# Patient Record
Sex: Male | Born: 1950 | Race: White | Hispanic: No | Marital: Married | Smoking: Former smoker
Health system: Southern US, Community
[De-identification: ages and names within clinical notes are randomized; demographics above are authoritative.]

## PROBLEM LIST (undated history)

## (undated) DIAGNOSIS — G629 Polyneuropathy, unspecified: Secondary | ICD-10-CM

## (undated) DIAGNOSIS — I10 Essential (primary) hypertension: Secondary | ICD-10-CM

## (undated) DIAGNOSIS — Z01810 Encounter for preprocedural cardiovascular examination: Secondary | ICD-10-CM

## (undated) DIAGNOSIS — T50902A Poisoning by unspecified drugs, medicaments and biological substances, intentional self-harm, initial encounter: Secondary | ICD-10-CM

## (undated) HISTORY — DX: Encounter for preprocedural cardiovascular examination: Z01.810

## (undated) HISTORY — PX: KNEE ARTHROSCOPY: SUR90

## (undated) HISTORY — DX: Poisoning by unspecified drugs, medicaments and biological substances, intentional self-harm, initial encounter: T50.902A

## (undated) HISTORY — PX: HERNIA REPAIR: SHX51

## (undated) HISTORY — PX: APPENDECTOMY: SHX54

## (undated) HISTORY — PX: SHOULDER SURGERY: SHX246

---

## 2011-09-02 DIAGNOSIS — E669 Obesity, unspecified: Secondary | ICD-10-CM | POA: Insufficient documentation

## 2011-09-02 HISTORY — DX: Obesity, unspecified: E66.9

## 2013-11-25 DIAGNOSIS — G603 Idiopathic progressive neuropathy: Secondary | ICD-10-CM

## 2013-11-25 HISTORY — DX: Idiopathic progressive neuropathy: G60.3

## 2016-01-16 ENCOUNTER — Other Ambulatory Visit: Payer: Self-pay | Admitting: Orthopedic Surgery

## 2016-01-16 DIAGNOSIS — M25511 Pain in right shoulder: Secondary | ICD-10-CM

## 2016-01-24 ENCOUNTER — Ambulatory Visit
Admission: RE | Admit: 2016-01-24 | Discharge: 2016-01-24 | Disposition: A | Discharge status: Home / Self Care | Payer: Medicare Other | Source: Ambulatory Visit | Attending: Orthopedic Surgery | Admitting: Orthopedic Surgery

## 2016-01-24 DIAGNOSIS — M25511 Pain in right shoulder: Secondary | ICD-10-CM

## 2016-03-06 ENCOUNTER — Other Ambulatory Visit (HOSPITAL_COMMUNITY): Payer: Self-pay | Admitting: Urology

## 2016-03-06 DIAGNOSIS — R972 Elevated prostate specific antigen [PSA]: Secondary | ICD-10-CM

## 2016-03-20 ENCOUNTER — Ambulatory Visit (HOSPITAL_COMMUNITY): Payer: Medicare Other

## 2016-11-17 DIAGNOSIS — G894 Chronic pain syndrome: Secondary | ICD-10-CM | POA: Diagnosis not present

## 2016-11-17 DIAGNOSIS — Z79891 Long term (current) use of opiate analgesic: Secondary | ICD-10-CM | POA: Diagnosis not present

## 2016-11-17 DIAGNOSIS — G629 Polyneuropathy, unspecified: Secondary | ICD-10-CM | POA: Diagnosis not present

## 2016-11-17 DIAGNOSIS — R69 Illness, unspecified: Secondary | ICD-10-CM | POA: Diagnosis not present

## 2016-11-17 DIAGNOSIS — M5090 Cervical disc disorder, unspecified, unspecified cervical region: Secondary | ICD-10-CM | POA: Diagnosis not present

## 2016-12-12 DIAGNOSIS — G603 Idiopathic progressive neuropathy: Secondary | ICD-10-CM | POA: Diagnosis not present

## 2017-01-06 DIAGNOSIS — M5090 Cervical disc disorder, unspecified, unspecified cervical region: Secondary | ICD-10-CM | POA: Diagnosis not present

## 2017-01-06 DIAGNOSIS — F1120 Opioid dependence, uncomplicated: Secondary | ICD-10-CM | POA: Diagnosis not present

## 2017-01-06 DIAGNOSIS — G894 Chronic pain syndrome: Secondary | ICD-10-CM | POA: Diagnosis not present

## 2017-01-06 DIAGNOSIS — G629 Polyneuropathy, unspecified: Secondary | ICD-10-CM | POA: Diagnosis not present

## 2017-01-06 DIAGNOSIS — R69 Illness, unspecified: Secondary | ICD-10-CM | POA: Diagnosis not present

## 2017-01-06 DIAGNOSIS — Z79891 Long term (current) use of opiate analgesic: Secondary | ICD-10-CM | POA: Diagnosis not present

## 2017-01-26 DIAGNOSIS — Z6830 Body mass index (BMI) 30.0-30.9, adult: Secondary | ICD-10-CM | POA: Diagnosis not present

## 2017-01-26 DIAGNOSIS — Z79899 Other long term (current) drug therapy: Secondary | ICD-10-CM | POA: Diagnosis not present

## 2017-01-26 DIAGNOSIS — D519 Vitamin B12 deficiency anemia, unspecified: Secondary | ICD-10-CM | POA: Diagnosis not present

## 2017-01-26 DIAGNOSIS — E876 Hypokalemia: Secondary | ICD-10-CM | POA: Diagnosis not present

## 2017-01-26 DIAGNOSIS — Z13220 Encounter for screening for lipoid disorders: Secondary | ICD-10-CM | POA: Diagnosis not present

## 2017-01-26 DIAGNOSIS — G629 Polyneuropathy, unspecified: Secondary | ICD-10-CM | POA: Diagnosis not present

## 2017-01-26 DIAGNOSIS — R5383 Other fatigue: Secondary | ICD-10-CM | POA: Diagnosis not present

## 2017-01-26 DIAGNOSIS — I10 Essential (primary) hypertension: Secondary | ICD-10-CM | POA: Diagnosis not present

## 2017-02-06 DIAGNOSIS — Z79899 Other long term (current) drug therapy: Secondary | ICD-10-CM | POA: Diagnosis not present

## 2017-02-10 DIAGNOSIS — G894 Chronic pain syndrome: Secondary | ICD-10-CM | POA: Diagnosis not present

## 2017-02-10 DIAGNOSIS — G629 Polyneuropathy, unspecified: Secondary | ICD-10-CM | POA: Diagnosis not present

## 2017-02-10 DIAGNOSIS — M5090 Cervical disc disorder, unspecified, unspecified cervical region: Secondary | ICD-10-CM | POA: Diagnosis not present

## 2017-02-10 DIAGNOSIS — Z79891 Long term (current) use of opiate analgesic: Secondary | ICD-10-CM | POA: Diagnosis not present

## 2017-02-10 DIAGNOSIS — R69 Illness, unspecified: Secondary | ICD-10-CM | POA: Diagnosis not present

## 2017-03-04 DIAGNOSIS — E876 Hypokalemia: Secondary | ICD-10-CM | POA: Diagnosis not present

## 2017-03-10 DIAGNOSIS — Z79891 Long term (current) use of opiate analgesic: Secondary | ICD-10-CM | POA: Diagnosis not present

## 2017-03-10 DIAGNOSIS — G629 Polyneuropathy, unspecified: Secondary | ICD-10-CM | POA: Diagnosis not present

## 2017-03-10 DIAGNOSIS — M5090 Cervical disc disorder, unspecified, unspecified cervical region: Secondary | ICD-10-CM | POA: Diagnosis not present

## 2017-03-10 DIAGNOSIS — R69 Illness, unspecified: Secondary | ICD-10-CM | POA: Diagnosis not present

## 2017-03-10 DIAGNOSIS — G894 Chronic pain syndrome: Secondary | ICD-10-CM | POA: Diagnosis not present

## 2017-04-07 DIAGNOSIS — M5090 Cervical disc disorder, unspecified, unspecified cervical region: Secondary | ICD-10-CM | POA: Diagnosis not present

## 2017-04-07 DIAGNOSIS — G629 Polyneuropathy, unspecified: Secondary | ICD-10-CM | POA: Diagnosis not present

## 2017-04-07 DIAGNOSIS — R69 Illness, unspecified: Secondary | ICD-10-CM | POA: Diagnosis not present

## 2017-04-07 DIAGNOSIS — G894 Chronic pain syndrome: Secondary | ICD-10-CM | POA: Diagnosis not present

## 2017-04-07 DIAGNOSIS — Z79891 Long term (current) use of opiate analgesic: Secondary | ICD-10-CM | POA: Diagnosis not present

## 2017-05-05 DIAGNOSIS — R69 Illness, unspecified: Secondary | ICD-10-CM | POA: Diagnosis not present

## 2017-05-05 DIAGNOSIS — G629 Polyneuropathy, unspecified: Secondary | ICD-10-CM | POA: Diagnosis not present

## 2017-05-05 DIAGNOSIS — Z79891 Long term (current) use of opiate analgesic: Secondary | ICD-10-CM | POA: Diagnosis not present

## 2017-05-05 DIAGNOSIS — G894 Chronic pain syndrome: Secondary | ICD-10-CM | POA: Diagnosis not present

## 2017-05-05 DIAGNOSIS — M5090 Cervical disc disorder, unspecified, unspecified cervical region: Secondary | ICD-10-CM | POA: Diagnosis not present

## 2017-05-26 DIAGNOSIS — Z1389 Encounter for screening for other disorder: Secondary | ICD-10-CM | POA: Diagnosis not present

## 2017-05-26 DIAGNOSIS — Z23 Encounter for immunization: Secondary | ICD-10-CM | POA: Diagnosis not present

## 2017-05-26 DIAGNOSIS — Z6830 Body mass index (BMI) 30.0-30.9, adult: Secondary | ICD-10-CM | POA: Diagnosis not present

## 2017-05-26 DIAGNOSIS — Z9181 History of falling: Secondary | ICD-10-CM | POA: Diagnosis not present

## 2017-05-26 DIAGNOSIS — Z0000 Encounter for general adult medical examination without abnormal findings: Secondary | ICD-10-CM | POA: Diagnosis not present

## 2017-06-03 DIAGNOSIS — M5090 Cervical disc disorder, unspecified, unspecified cervical region: Secondary | ICD-10-CM | POA: Diagnosis not present

## 2017-06-03 DIAGNOSIS — Z79891 Long term (current) use of opiate analgesic: Secondary | ICD-10-CM | POA: Diagnosis not present

## 2017-06-03 DIAGNOSIS — G629 Polyneuropathy, unspecified: Secondary | ICD-10-CM | POA: Diagnosis not present

## 2017-06-03 DIAGNOSIS — R69 Illness, unspecified: Secondary | ICD-10-CM | POA: Diagnosis not present

## 2017-06-03 DIAGNOSIS — G894 Chronic pain syndrome: Secondary | ICD-10-CM | POA: Diagnosis not present

## 2017-06-08 DIAGNOSIS — E041 Nontoxic single thyroid nodule: Secondary | ICD-10-CM | POA: Diagnosis not present

## 2017-06-08 DIAGNOSIS — I714 Abdominal aortic aneurysm, without rupture: Secondary | ICD-10-CM | POA: Diagnosis not present

## 2017-06-08 DIAGNOSIS — E049 Nontoxic goiter, unspecified: Secondary | ICD-10-CM | POA: Diagnosis not present

## 2017-06-18 ENCOUNTER — Encounter (HOSPITAL_COMMUNITY): Payer: Self-pay | Admitting: Emergency Medicine

## 2017-06-18 ENCOUNTER — Emergency Department (HOSPITAL_COMMUNITY)
Admission: EM | Admit: 2017-06-18 | Discharge: 2017-06-19 | Disposition: A | Discharge status: Other Acute Inpatient Hospital | Payer: Medicare HMO | Attending: Emergency Medicine | Admitting: Emergency Medicine

## 2017-06-18 DIAGNOSIS — R69 Illness, unspecified: Secondary | ICD-10-CM | POA: Diagnosis not present

## 2017-06-18 DIAGNOSIS — R45851 Suicidal ideations: Secondary | ICD-10-CM | POA: Diagnosis not present

## 2017-06-18 DIAGNOSIS — F329 Major depressive disorder, single episode, unspecified: Secondary | ICD-10-CM | POA: Diagnosis not present

## 2017-06-18 DIAGNOSIS — F32A Depression, unspecified: Secondary | ICD-10-CM

## 2017-06-18 HISTORY — DX: Polyneuropathy, unspecified: G62.9

## 2017-06-18 LAB — COMPREHENSIVE METABOLIC PANEL
ALBUMIN: 4.5 g/dL (ref 3.5–5.0)
ALT: 19 U/L (ref 17–63)
AST: 19 U/L (ref 15–41)
Alkaline Phosphatase: 100 U/L (ref 38–126)
Anion gap: 10 (ref 5–15)
BUN: 12 mg/dL (ref 6–20)
CHLORIDE: 100 mmol/L — AB (ref 101–111)
CO2: 29 mmol/L (ref 22–32)
CREATININE: 1.26 mg/dL — AB (ref 0.61–1.24)
Calcium: 9.5 mg/dL (ref 8.9–10.3)
GFR calc Af Amer: >60 mL/min (ref >60)
GFR calc non Af Amer: 58 mL/min — ABNORMAL LOW (ref >60)
GLUCOSE: 130 mg/dL — AB (ref 65–99)
POTASSIUM: 3.5 mmol/L (ref 3.5–5.1)
SODIUM: 139 mmol/L (ref 135–145)
Total Bilirubin: 1.1 mg/dL (ref 0.3–1.2)
Total Protein: 8.2 g/dL — ABNORMAL HIGH (ref 6.5–8.1)

## 2017-06-18 LAB — RAPID URINE DRUG SCREEN, HOSP PERFORMED
AMPHETAMINES: NOT DETECTED
BENZODIAZEPINES: POSITIVE — AB
Barbiturates: NOT DETECTED
Cocaine: NOT DETECTED
OPIATES: POSITIVE — AB
TETRAHYDROCANNABINOL: NOT DETECTED

## 2017-06-18 LAB — CBC
HCT: 44.8 % (ref 39.0–52.0)
HEMOGLOBIN: 15.4 g/dL (ref 13.0–17.0)
MCH: 29.8 pg (ref 26.0–34.0)
MCHC: 34.4 g/dL (ref 30.0–36.0)
MCV: 86.8 fL (ref 78.0–100.0)
PLATELETS: 244 10*3/uL (ref 150–400)
RBC: 5.16 MIL/uL (ref 4.22–5.81)
RDW: 13.5 % (ref 11.5–15.5)
WBC: 10.6 10*3/uL — AB (ref 4.0–10.5)

## 2017-06-18 LAB — SALICYLATE LEVEL: Salicylate Lvl: <7 mg/dL (ref 2.8–30.0)

## 2017-06-18 LAB — ETHANOL: Alcohol, Ethyl (B): <5 mg/dL (ref <5)

## 2017-06-18 LAB — ACETAMINOPHEN LEVEL: Acetaminophen (Tylenol), Serum: <10 ug/mL — ABNORMAL LOW (ref 10–30)

## 2017-06-18 NOTE — ED Triage Notes (Signed)
Pt here with increased anxiety and depression; pt sts SI with plan to either use a gun or overdose; pt sts going on for weeks getting more severe; pt denies ever speaking to psychiatrist

## 2017-06-18 NOTE — ED Notes (Signed)
Pt's wife stated that she was going home for the night, she stated that pt took his night time medication at 1900 tonight. Sherree Knape: [phone] (home), [phone] (cell). Wife took belongings home.

## 2017-06-19 ENCOUNTER — Encounter: Payer: Self-pay | Admitting: Psychiatry

## 2017-06-19 ENCOUNTER — Inpatient Hospital Stay
Admission: AD | Admit: 2017-06-19 | Discharge: 2017-06-26 | DRG: 885 | Disposition: A | Discharge status: Home / Self Care | Payer: Medicare HMO | Source: Intra-hospital | Attending: Psychiatry | Admitting: Psychiatry

## 2017-06-19 ENCOUNTER — Other Ambulatory Visit: Payer: Self-pay | Admitting: Family

## 2017-06-19 DIAGNOSIS — R45851 Suicidal ideations: Secondary | ICD-10-CM | POA: Diagnosis present

## 2017-06-19 DIAGNOSIS — R29818 Other symptoms and signs involving the nervous system: Secondary | ICD-10-CM | POA: Diagnosis present

## 2017-06-19 DIAGNOSIS — F333 Major depressive disorder, recurrent, severe with psychotic symptoms: Principal | ICD-10-CM | POA: Diagnosis present

## 2017-06-19 DIAGNOSIS — K5900 Constipation, unspecified: Secondary | ICD-10-CM | POA: Diagnosis present

## 2017-06-19 DIAGNOSIS — R682 Dry mouth, unspecified: Secondary | ICD-10-CM | POA: Diagnosis present

## 2017-06-19 DIAGNOSIS — Z818 Family history of other mental and behavioral disorders: Secondary | ICD-10-CM

## 2017-06-19 DIAGNOSIS — R69 Illness, unspecified: Secondary | ICD-10-CM | POA: Diagnosis not present

## 2017-06-19 DIAGNOSIS — N400 Enlarged prostate without lower urinary tract symptoms: Secondary | ICD-10-CM | POA: Diagnosis present

## 2017-06-19 DIAGNOSIS — G894 Chronic pain syndrome: Secondary | ICD-10-CM | POA: Diagnosis present

## 2017-06-19 DIAGNOSIS — E785 Hyperlipidemia, unspecified: Secondary | ICD-10-CM | POA: Diagnosis present

## 2017-06-19 DIAGNOSIS — E538 Deficiency of other specified B group vitamins: Secondary | ICD-10-CM | POA: Diagnosis present

## 2017-06-19 DIAGNOSIS — G4700 Insomnia, unspecified: Secondary | ICD-10-CM | POA: Diagnosis present

## 2017-06-19 DIAGNOSIS — I10 Essential (primary) hypertension: Secondary | ICD-10-CM | POA: Diagnosis present

## 2017-06-19 DIAGNOSIS — R4189 Other symptoms and signs involving cognitive functions and awareness: Secondary | ICD-10-CM | POA: Diagnosis present

## 2017-06-19 DIAGNOSIS — N138 Other obstructive and reflux uropathy: Secondary | ICD-10-CM | POA: Diagnosis present

## 2017-06-19 DIAGNOSIS — F322 Major depressive disorder, single episode, severe without psychotic features: Secondary | ICD-10-CM | POA: Diagnosis not present

## 2017-06-19 DIAGNOSIS — Z5181 Encounter for therapeutic drug level monitoring: Secondary | ICD-10-CM | POA: Diagnosis not present

## 2017-06-19 DIAGNOSIS — F411 Generalized anxiety disorder: Secondary | ICD-10-CM

## 2017-06-19 DIAGNOSIS — R410 Disorientation, unspecified: Secondary | ICD-10-CM | POA: Diagnosis not present

## 2017-06-19 DIAGNOSIS — G603 Idiopathic progressive neuropathy: Secondary | ICD-10-CM | POA: Diagnosis present

## 2017-06-19 DIAGNOSIS — F329 Major depressive disorder, single episode, unspecified: Secondary | ICD-10-CM | POA: Diagnosis not present

## 2017-06-19 HISTORY — DX: Essential (primary) hypertension: I10

## 2017-06-19 HISTORY — DX: Chronic pain syndrome: G89.4

## 2017-06-19 HISTORY — DX: Other obstructive and reflux uropathy: N13.8

## 2017-06-19 MED ORDER — AMLODIPINE BESYLATE 5 MG PO TABS
10.0000 mg | ORAL_TABLET | Freq: Every day | ORAL | Status: DC
  Administered 2017-06-19: 10 mg via ORAL
  Filled 2017-06-19 (×2): qty 2

## 2017-06-19 MED ORDER — AMITRIPTYLINE HCL 25 MG PO TABS: 25.0000 mg | ORAL_TABLET | Freq: Every day | ORAL | Status: DC

## 2017-06-19 MED ORDER — AMITRIPTYLINE HCL 25 MG PO TABS
25.0000 mg | ORAL_TABLET | Freq: Once | ORAL | Status: AC
  Administered 2017-06-19: 25 mg via ORAL
  Filled 2017-06-19: qty 1

## 2017-06-19 MED ORDER — BUPROPION HCL ER (XL) 150 MG PO TB24: 150.0000 mg | ORAL_TABLET | Freq: Every day | ORAL | Status: DC

## 2017-06-19 MED ORDER — LISINOPRIL 20 MG PO TABS
40.0000 mg | ORAL_TABLET | Freq: Every day | ORAL | Status: DC
  Administered 2017-06-19: 40 mg via ORAL
  Filled 2017-06-19 (×2): qty 2

## 2017-06-19 MED ORDER — DULOXETINE HCL 30 MG PO CPEP
60.0000 mg | ORAL_CAPSULE | Freq: Every day | ORAL | Status: DC
  Administered 2017-06-19 – 2017-06-23 (×5): 60 mg via ORAL
  Filled 2017-06-19 (×4): qty 2

## 2017-06-19 MED ORDER — CYCLOBENZAPRINE HCL 10 MG PO TABS: 10.0000 mg | ORAL_TABLET | Freq: Every day | ORAL | Status: DC

## 2017-06-19 MED ORDER — PREGABALIN 50 MG PO CAPS
200.0000 mg | ORAL_CAPSULE | Freq: Once | ORAL | Status: AC
  Administered 2017-06-19: 200 mg via ORAL
  Filled 2017-06-19: qty 4

## 2017-06-19 MED ORDER — CYCLOBENZAPRINE HCL 10 MG PO TABS
10.0000 mg | ORAL_TABLET | Freq: Every day | ORAL | Status: DC
  Administered 2017-06-19 – 2017-06-25 (×7): 10 mg via ORAL
  Filled 2017-06-19 (×7): qty 1

## 2017-06-19 MED ORDER — AMLODIPINE BESYLATE 5 MG PO TABS
10.0000 mg | ORAL_TABLET | Freq: Every day | ORAL | Status: DC
  Administered 2017-06-20 – 2017-06-26 (×8): 10 mg via ORAL
  Filled 2017-06-19 (×8): qty 2

## 2017-06-19 MED ORDER — MAGNESIUM HYDROXIDE 400 MG/5ML PO SUSP: 30.0000 mL | Freq: Every day | ORAL | Status: DC | PRN

## 2017-06-19 MED ORDER — HYDROCODONE-ACETAMINOPHEN 7.5-325 MG PO TABS
1.0000 | ORAL_TABLET | Freq: Three times a day (TID) | ORAL | Status: DC
  Administered 2017-06-19 (×2): 1 via ORAL
  Filled 2017-06-19 (×2): qty 1

## 2017-06-19 MED ORDER — ACETAMINOPHEN 325 MG PO TABS
650.0000 mg | ORAL_TABLET | Freq: Four times a day (QID) | ORAL | Status: DC | PRN
Start: 2017-06-19 — End: 2017-06-26

## 2017-06-19 MED ORDER — LISINOPRIL 20 MG PO TABS
40.0000 mg | ORAL_TABLET | Freq: Every day | ORAL | Status: DC
  Administered 2017-06-20 – 2017-06-26 (×7): 40 mg via ORAL
  Filled 2017-06-19 (×8): qty 2

## 2017-06-19 MED ORDER — POTASSIUM CHLORIDE CRYS ER 20 MEQ PO TBCR
20.0000 meq | EXTENDED_RELEASE_TABLET | Freq: Two times a day (BID) | ORAL | Status: DC
  Administered 2017-06-19: 20 meq via ORAL
  Filled 2017-06-19: qty 1

## 2017-06-19 MED ORDER — ALUM & MAG HYDROXIDE-SIMETH 200-200-20 MG/5ML PO SUSP: 30.0000 mL | ORAL | Status: DC | PRN

## 2017-06-19 MED ORDER — TRAZODONE HCL 100 MG PO TABS
100.0000 mg | ORAL_TABLET | Freq: Every evening | ORAL | Status: DC | PRN
  Administered 2017-06-19 – 2017-06-21 (×3): 100 mg via ORAL
  Filled 2017-06-19 (×3): qty 1

## 2017-06-19 MED ORDER — B COMPLEX-C PO TABS
1.0000 | ORAL_TABLET | Freq: Every day | ORAL | Status: DC
  Administered 2017-06-19: 1 via ORAL
  Filled 2017-06-19 (×2): qty 1

## 2017-06-19 MED ORDER — HYDROCODONE-ACETAMINOPHEN 7.5-325 MG/15ML PO SOLN: 10.0000 mL | Freq: Four times a day (QID) | ORAL | Status: DC | PRN

## 2017-06-19 MED ORDER — PRAVASTATIN SODIUM 20 MG PO TABS
20.0000 mg | ORAL_TABLET | Freq: Every day | ORAL | Status: DC
  Administered 2017-06-20 – 2017-06-25 (×6): 20 mg via ORAL
  Filled 2017-06-19 (×6): qty 1

## 2017-06-19 MED ORDER — HYDROCODONE-ACETAMINOPHEN 7.5-325 MG PO TABS
1.0000 | ORAL_TABLET | Freq: Four times a day (QID) | ORAL | Status: DC | PRN
  Administered 2017-06-19: 1 via ORAL
  Filled 2017-06-19: qty 1

## 2017-06-19 MED ORDER — ADULT MULTIVITAMIN W/MINERALS CH
1.0000 | ORAL_TABLET | Freq: Every day | ORAL | Status: DC
  Administered 2017-06-19: 1 via ORAL
  Filled 2017-06-19: qty 1

## 2017-06-19 MED ORDER — PRAVASTATIN SODIUM 40 MG PO TABS
20.0000 mg | ORAL_TABLET | Freq: Every day | ORAL | Status: DC
  Administered 2017-06-19: 20 mg via ORAL
  Filled 2017-06-19 (×2): qty 1

## 2017-06-19 MED ORDER — PREGABALIN 50 MG PO CAPS
200.0000 mg | ORAL_CAPSULE | Freq: Three times a day (TID) | ORAL | Status: DC
  Administered 2017-06-20 – 2017-06-22 (×8): 200 mg via ORAL
  Filled 2017-06-19 (×8): qty 4

## 2017-06-19 MED ORDER — KETOROLAC TROMETHAMINE 60 MG/2ML IM SOLN
60.0000 mg | Freq: Once | INTRAMUSCULAR | Status: AC
  Administered 2017-06-19: 60 mg via INTRAMUSCULAR
  Filled 2017-06-19: qty 2

## 2017-06-19 MED ORDER — ALPRAZOLAM 0.25 MG PO TABS: 0.2500 mg | ORAL_TABLET | Freq: Every day | ORAL | Status: DC

## 2017-06-19 MED ORDER — HYPROMELLOSE (GONIOSCOPIC) 2.5 % OP SOLN: 2.0000 [drp] | Freq: Three times a day (TID) | OPHTHALMIC | Status: DC | PRN

## 2017-06-19 MED ORDER — AMITRIPTYLINE HCL 25 MG PO TABS: 75.0000 mg | ORAL_TABLET | Freq: Every day | ORAL | Status: DC

## 2017-06-19 MED ORDER — PREGABALIN 50 MG PO CAPS
100.0000 mg | ORAL_CAPSULE | Freq: Three times a day (TID) | ORAL | Status: DC
  Administered 2017-06-19 (×2): 100 mg via ORAL
  Filled 2017-06-19 (×2): qty 2

## 2017-06-19 MED ORDER — BUPROPION HCL ER (XL) 150 MG PO TB24
150.0000 mg | ORAL_TABLET | Freq: Every day | ORAL | Status: DC
  Administered 2017-06-19 – 2017-06-22 (×4): 150 mg via ORAL
  Filled 2017-06-19 (×3): qty 1

## 2017-06-19 NOTE — BHH Counselor (Signed)
Writer informed Attending Provider, Lita Mains, MD, of disposition for recommendation of inpatient treatment for Patient at [phone]. Per AC, Lavell Luster, no available beds at [facility]. TTS to seek placement.

## 2017-06-19 NOTE — ED Notes (Signed)
Tried to call report. Nurse will not be able to take report until 19:30.

## 2017-06-19 NOTE — ED Notes (Signed)
Pt verbalized understanding discharge instructions and denies any further needs or questions at this time. VS stable, ambulatory and steady gait.   

## 2017-06-19 NOTE — ED Notes (Signed)
Pt c/o of pins and needle pain all over his body. BP is trending down.

## 2017-06-19 NOTE — ED Notes (Signed)
Pt requesting to have BP meds rescheduled, pt states, " I take those at night." pt aware his BP is high & may need to take meds as scheduled, message sent to pharmacist in the ED re: pts request, will reassess BP @ 11:00 pt aware of plan

## 2017-06-19 NOTE — ED Notes (Signed)
TTS being done at this time.

## 2017-06-19 NOTE — ED Notes (Signed)
Nanavati, Md notified for pts daily med orders to be placed

## 2017-06-19 NOTE — ED Notes (Signed)
EDP Browning at bedside.

## 2017-06-19 NOTE — ED Notes (Signed)
Pt ambulated to BR with even, steady gait.  

## 2017-06-19 NOTE — ED Notes (Signed)
Pt has been accepted at [facility], a bed has not been assigned yet but the plan is to take the Pt today.  [employer] called and asked for a Voluntary Consent to be faxed to  [phone].  Dr. Bary Leriche is the admitting  MD.  Call report to [phone].

## 2017-06-19 NOTE — BH Assessment (Signed)
Patient has been accepted to [facility].  Accepting physician is Dr. Bary Leriche.  Attending Physician will be Dr. Jerilee Hoh.  Patient has been assigned to room 302, by [place].  Call report to [phone].  Representative/Transfer Coordinator is Dispensing optician Patient pre-admitted by [facility] Vonna Kotyk)  [facility] Parks Neptune, Social Worker) made aware of acceptance.

## 2017-06-19 NOTE — ED Notes (Signed)
Pt's wife at bedside.

## 2017-06-19 NOTE — ED Provider Notes (Signed)
[facility] DEPT Provider Note   CSN: [id number] Arrival date & time: 06/18/17  1726     History   Chief Complaint Chief Complaint  Patient presents with  . Suicidal    HPI Joel Gross is a 66 y.o. male.  Patient with past medical history of peripheral neuropathy presents to the emergency department with chief complaint of suicidal ideation and depression. He states that he has felt very depressed for the past 3 years because of his chronic pain in his legs. He states that he has been feeling suicidal recently. He has had a gun at home, but recently gave this to his daughter for safekeeping. He states that because he does not have a gun, he would now overdose on his medications. He denies any drug or alcohol use. He denies any homicidal ideation. Denies any auditory or visual hallucinations.   The history is provided by the patient. No language interpreter was used.    Past Medical History:  Diagnosis Date  . Neuropathy     There are no active problems to display for this patient.   History reviewed. No pertinent surgical history.     Home Medications    Prior to Admission medications   Not on File    Family History History reviewed. No pertinent family history.  Social History Social History  Substance Use Topics  . Smoking status: Never Smoker  . Smokeless tobacco: Never Used  . Alcohol use No     Allergies   Patient has no known allergies.   Review of Systems Review of Systems  All other systems reviewed and are negative.    Physical Exam Updated Vital Signs BP (!) 137/93 (BP Location: Right Arm)   Pulse 64   Temp 97.9 F (36.6 C) (Oral)   Resp 16   SpO2 94%   Physical Exam  Constitutional: He is oriented to person, place, and time. He appears well-developed and well-nourished.  HENT:  Head: Normocephalic and atraumatic.  Eyes: Pupils are equal, round, and reactive to light. Conjunctivae and EOM are normal. Right eye exhibits no  discharge. Left eye exhibits no discharge. No scleral icterus.  Neck: Normal range of motion. Neck supple. No JVD present.  Cardiovascular: Normal rate, regular rhythm and normal heart sounds.  Exam reveals no gallop and no friction rub.   No murmur heard. Pulmonary/Chest: Effort normal and breath sounds normal. No respiratory distress. He has no wheezes. He has no rales. He exhibits no tenderness.  Abdominal: Soft. He exhibits no distension and no mass. There is no tenderness. There is no rebound and no guarding.  Musculoskeletal: Normal range of motion. He exhibits no edema or tenderness.  Neurological: He is alert and oriented to person, place, and time.  Skin: Skin is warm and dry.  Psychiatric: He has a normal mood and affect. His behavior is normal. Judgment and thought content normal.  Nursing note and vitals reviewed.    ED Treatments / Results  Labs (all labs ordered are listed, but only abnormal results are displayed) Labs Reviewed  COMPREHENSIVE METABOLIC PANEL - Abnormal; Notable for the following:       Result Value   Chloride 100 (*)    Glucose, Bld 130 (*)    Creatinine, Ser 1.26 (*)    Total Protein 8.2 (*)    GFR calc non Af Amer 58 (*)    All other components within normal limits  ACETAMINOPHEN LEVEL - Abnormal; Notable for the following:    Acetaminophen (  Tylenol), Serum <10 (*)    All other components within normal limits  CBC - Abnormal; Notable for the following:    WBC 10.6 (*)    All other components within normal limits  RAPID URINE DRUG SCREEN, HOSP PERFORMED - Abnormal; Notable for the following:    Opiates POSITIVE (*)    Benzodiazepines POSITIVE (*)    All other components within normal limits  ETHANOL  SALICYLATE LEVEL    EKG  EKG Interpretation None       Radiology No results found.  Procedures Procedures (including critical care time)  Medications Ordered in ED Medications - No data to display   Initial Impression / Assessment  and Plan / ED Course  I have reviewed the triage vital signs and the nursing notes.  Pertinent labs & imaging results that were available during my care of the patient were reviewed by me and considered in my medical decision making (see chart for details).     Patient here with suicidal ideation. Associated depression. Denies drug or alcohol use.  Medically clear for psychiatric examination.   Final Clinical Impressions(s) / ED Diagnoses   Final diagnoses:  Suicidal ideation  Depression, unspecified depression type    New Prescriptions New Prescriptions   No medications on file     Montine Circle, Hershal Coria 06/19/17 Jocelyn Lamer, MD 06/26/17 1455

## 2017-06-19 NOTE — ED Notes (Signed)
Pharmacy called to reconcile pt's home medications. Sending tech.

## 2017-06-19 NOTE — ED Notes (Signed)
Snack provided to pt

## 2017-06-19 NOTE — BH Assessment (Addendum)
Tele Assessment Note   Joel Gross is an 66 y.o.married male, who voluntarily came into [facility].  [place], [place]. Patient reported contacting a mobile crisis line on 06/18/2017 and being referred to [facility] in [place], [place].  Patient stated that after speaking with a representative at [facility], he was referred to come into the ED.  Patient reported having ongoing medical concerns, relating to pain in his leg.  Patient stated that, as a result, he has been experiencing ongoing suicidal ideations, over the past couple of months.  Patient reported having a current suicidal ideation with a plan to overdose with his prescribed medications or shoot himself in the mouth with a gun. Patient reported having access to his medications, however stated that his gun was locked in a safe, by his daughter.  Patient reported experiencing auditory hallucinations consisting of hearing voices and visual hallucinations consisting of seeing shadows and numbers.  Patient reported currently being prescribed medications for his pain and being compliant with daily dosages.  Patient stated experiencing depressive symptoms, such as fatigue, tearfulness, isolation, feelings of worthless, guilt, loss of interest in pleasure activities, and recurrent thoughts about negative personal behaviors from his past.  Patient denies HI or self-injurious behaviors.   Patient reported currently residing with his wife and receiving social security benefits.  Patient reported experiencing weakness in both of his legs, however confirmed no use of aids and being able to independently complete ADLs.  Patient reported no past history of outpatient or inpatient services of outpatient or inpatient treatment for mental health or substance abuse.  Patient stated currently receiving no outpatient services from outpatient providers. Patient identified supportive factors consist of his wife and coping such as deep breathing exercises.  During the  assessment, Patient was alert, calm and cooperative.  Patient was dressed in scrubs and laying in his bed.  Patient was oriented to the person, location, situation, and time.   Patient's speech was logical, coherent, and slow.   Patient's judgment appeared to be unimpaired.  Patient's thought process was coherent, relevant, and circumstantial.  Patient's mood appeared to be depressed and helpless.  Patient reported being open to any recommendation from provider for services.      Diagnosis: Major depressive disorder, recurrent, severe, without psychotic features  Past Medical History:  Past Medical History:  Diagnosis Date  . Neuropathy     History reviewed. No pertinent surgical history.  Family History: History reviewed. No pertinent family history.  Social History:  reports that he has never smoked. He has never used smokeless tobacco. He reports that he does not drink alcohol or use drugs.  Additional Social History:  Alcohol / Drug Use Pain Medications: See MAR Prescriptions: See MAR Over the Counter: See MAR History of alcohol / drug use?: No history of alcohol / drug abuse (Pt. taking prescribed medications for medical pain.) Longest period of sobriety (when/how long): N/A  CIWA: CIWA-Ar BP: (!) 137/93 Pulse Rate: 64 COWS:    PATIENT STRENGTHS: (choose at least two) Ability for insight Average or above average intelligence Child psychotherapist Motivation for treatment/growth Supportive family/friends  Allergies: No Known Allergies  Home Medications:  (Not in a hospital admission)  OB/GYN Status:  No LMP for male patient.  General Assessment Data Location of Assessment: [facility] ED TTS Assessment: In system Is this a Tele or Face-to-Face Assessment?: Tele Assessment Is this an Initial Assessment or a Re-assessment for this encounter?: Initial Assessment Marital status: Married Is patient pregnant?: No Pregnancy  Status: No Living Arrangements:  Spouse/significant other Can pt return to current living arrangement?: Yes Admission Status: Voluntary Is patient capable of signing voluntary admission?: Yes Referral Source: Self/Family/Friend Insurance type: Medicare     Crisis Care Plan Living Arrangements: Spouse/significant other Legal Guardian: Other: (Self) Name of Psychiatrist: None Name of Therapist: None  Education Status Is patient currently in school?: No Current Grade: N/A Highest grade of school patient has completed: [employer] Name of school: N/A Contact person: N/A  Risk to self with the past 6 months Suicidal Ideation: Yes-Currently Present Has patient been a risk to self within the past 6 months prior to admission? : Yes Suicidal Intent: Yes-Currently Present Has patient had any suicidal intent within the past 6 months prior to admission? : Yes Is patient at risk for suicide?: Yes Suicidal Plan?: Yes-Currently Present Has patient had any suicidal plan within the past 6 months prior to admission? : Yes Specify Current Suicidal Plan: Pt. reports plan to overdose with prescribed medication or shoot himself wtih a gun. Access to Means: Yes Specify Access to Suicidal Means: Pt. reports having access to his medication, but no access to a gun.  What has been your use of drugs/alcohol within the last 12 months?: Patient denies Previous Attempts/Gestures: No How many times?: 0 Other Self Harm Risks: Patient denies. Triggers for Past Attempts: None known Intentional Self Injurious Behavior: None Family Suicide History: Yes Recent stressful life event(s): Other (Comment) (Patient reports concents due to his health. ) Persecutory voices/beliefs?: No Depression: Yes Depression Symptoms: Fatigue, Tearfulness, Feeling worthless/self pity, Feeling angry/irritable, Guilt, Loss of interest in usual pleasures Substance abuse history and/or treatment for substance abuse?: No Suicide prevention information given to  non-admitted patients: Not applicable  Risk to Others within the past 6 months Homicidal Ideation: No (Patient denies) Does patient have any lifetime risk of violence toward others beyond the six months prior to admission? : No Thoughts of Harm to Others: No Current Homicidal Intent: No Current Homicidal Plan: No Access to Homicidal Means: No (Patient denies) Identified Victim: Patient denies History of harm to others?: No Assessment of Violence: On admission Violent Behavior Description: Patient denies Does patient have access to weapons?: No (Patient denies) Criminal Charges Pending?: No Does patient have a court date: No Is patient on probation?: No  Psychosis Hallucinations: Auditory, Visual Delusions: None noted  Mental Status Report Appearance/Hygiene: In scrubs Eye Contact: Good Motor Activity: Rigidity, Unsteady Speech: Logical/coherent, Slow Level of Consciousness: Alert Mood: Depressed, Helpless Affect: Appropriate to circumstance, Depressed Anxiety Level: None Thought Processes: Coherent, Relevant, Circumstantial Judgement: Unimpaired Orientation: Person, Place, Time, Situation Obsessive Compulsive Thoughts/Behaviors: None  Cognitive Functioning Concentration: Good Memory: Recent Intact, Remote Intact IQ: Average Insight: Good Impulse Control: Fair Appetite: Good Weight Loss: 0 Weight Gain: 15 (Per Pt. a weight gain over a 2-year.) Sleep: No Change (7) Total Hours of Sleep: 7 Vegetative Symptoms: None  ADLScreening [facility] Assessment Services) Patient's cognitive ability adequate to safely complete daily activities?: Yes Patient able to express need for assistance with ADLs?: Yes Independently performs ADLs?: Yes (appropriate for developmental age)  Prior Inpatient Therapy Prior Inpatient Therapy: No Prior Therapy Dates: None Prior Therapy Facilty/Provider(s): None Reason for Treatment: None  Prior Outpatient Therapy Prior Outpatient Therapy:  No Prior Therapy Dates: None Prior Therapy Facilty/Provider(s): None Reason for Treatment: None Does patient have an ACCT team?: No Does patient have Intensive In-House Services?  : No Does patient have [facility] services? : No Does patient have P4CC services?: No  ADL Screening (condition at time of admission) Patient's cognitive ability adequate to safely complete daily activities?: Yes Is the patient deaf or have difficulty hearing?: No Does the patient have difficulty seeing, even when wearing glasses/contacts?: No Does the patient have difficulty concentrating, remembering, or making decisions?: No Patient able to express need for assistance with ADLs?: Yes Does the patient have difficulty dressing or bathing?: No Independently performs ADLs?: Yes (appropriate for developmental age) Does the patient have difficulty walking or climbing stairs?: Yes Weakness of Legs: Both Weakness of Arms/Hands: None  Home Assistive Devices/Equipment Home Assistive Devices/Equipment: None    Abuse/Neglect Assessment (Assessment to be complete while patient is alone) Physical Abuse: Denies Verbal Abuse: Denies Sexual Abuse: Denies Exploitation of patient/patient's resources: Denies Self-Neglect: Denies     Regulatory affairs officer (For Healthcare) Does Patient Have a Medical Advance Directive?: No Would patient like information on creating a medical advance directive?: No - Patient declined    Additional Information 1:1 In Past 12 Months?: No CIRT Risk: No Elopement Risk: No Does patient have medical clearance?: Yes     Disposition:  Disposition Initial Assessment Completed for this Encounter: Yes (Per Lindon Romp, NP) Disposition of Patient: Inpatient treatment program Type of inpatient treatment program: Adult  Marcine Matar 06/19/2017 3:23 AM

## 2017-06-19 NOTE — Progress Notes (Signed)
Patient accepted at [facility], to Dr. Bary Leriche.  Please call report at [phone].  LCSW Calvin requested Voluntary Consent to be signed by patient and faxed to [facility] at fax#: [phone]. [facility] RN Myriam Jacobson was notified.   Verlon Setting, MSW, [place] Clinical social worker in [place] [facility], [place] Office [phone] and [phone] 06/19/2017 4:50 PM

## 2017-06-19 NOTE — Progress Notes (Signed)
Per Kerry Dory at [facility], patient is accepted to room 302. Voluntary consent has been received. [facility] RN Myriam Jacobson was informed.  Verlon Setting, MSW, [place] Clinical social worker in disposition [place] [facility], [place] Office [phone] and [phone] 06/19/2017 6:43 PM

## 2017-06-19 NOTE — ED Notes (Signed)
Pt's wife called for an update.  Stated that patient also takes Miralax in the morning due to hx of chronic pain and pain medication use.

## 2017-06-20 DIAGNOSIS — F322 Major depressive disorder, single episode, severe without psychotic features: Secondary | ICD-10-CM

## 2017-06-20 LAB — HEMOGLOBIN A1C
HEMOGLOBIN A1C: 5.5 % (ref 4.8–5.6)
Mean Plasma Glucose: 111.15 mg/dL

## 2017-06-20 LAB — LIPID PANEL
CHOLESTEROL: 140 mg/dL (ref 0–200)
HDL: 29 mg/dL — ABNORMAL LOW (ref >40)
LDL Cholesterol: 90 mg/dL (ref 0–99)
TRIGLYCERIDES: 106 mg/dL (ref <150)
Total CHOL/HDL Ratio: 4.8 RATIO
VLDL: 21 mg/dL (ref 0–40)

## 2017-06-20 LAB — TSH: TSH: 0.83 u[IU]/mL (ref 0.350–4.500)

## 2017-06-20 MED ORDER — POTASSIUM CHLORIDE 20 MEQ PO PACK
20.0000 meq | PACK | Freq: Two times a day (BID) | ORAL | Status: DC
  Administered 2017-06-20 – 2017-06-26 (×12): 20 meq via ORAL
  Filled 2017-06-20 (×13): qty 1

## 2017-06-20 MED ORDER — AMITRIPTYLINE HCL 50 MG PO TABS
75.0000 mg | ORAL_TABLET | Freq: Every day | ORAL | Status: DC
  Administered 2017-06-20 – 2017-06-21 (×2): 75 mg via ORAL
  Filled 2017-06-20 (×2): qty 1

## 2017-06-20 MED ORDER — HYDROCODONE-ACETAMINOPHEN 7.5-325 MG PO TABS
1.0000 | ORAL_TABLET | Freq: Three times a day (TID) | ORAL | Status: DC
  Administered 2017-06-20 – 2017-06-26 (×16): 1 via ORAL
  Filled 2017-06-20 (×16): qty 1

## 2017-06-20 NOTE — BHH Group Notes (Signed)
[place] LCSW Group Therapy  06/20/2017 1:00 PM  Type of Therapy:  Group Therapy  Participation Level:  Active  Participation Quality:  Appropriate and Sharing  Affect:  Appropriate  Cognitive:  Alert  Insight:  Developing/Improving  Engagement in Therapy:  Engaged  Modes of Intervention:  Activity, Clarification, Discussion, Education, Exploration, Limit-setting, Problem-solving, Reality Testing, Socialization and Support  Summary of Progress/Problems: Communications: Patients identify how individuals communicate with one another appropriately and inappropriately. Patients will be guided to discuss their thoughts, feelings, and behaviors related to barriers when communicating. The group will process together ways to execute positive and appropriate communications. Patient shared with the group that he struggles with communication with his wife. Patient states that he tends to be more aggressive in his communication. Patient states he is going to work on listening to others and being receptive to feedback.   Vanya Carberry G. Gays, [place] 06/20/2017, 1:01 PM

## 2017-06-20 NOTE — Plan of Care (Signed)
Problem: Coping: Goal: Ability to verbalize feelings will improve Outcome: Progressing Patient verbalized feelings to staff.    

## 2017-06-20 NOTE — Progress Notes (Addendum)
Admission Note:  68yr male who presents IVC in no acute distress for the treatment of SI and Depression. Patient is alert and oriented x 4, appears flat and depressed, mood is calm and cooperative with admission process. Patient's thoughts are organized, he presents with passive SI and contracts for safety upon admission. Pt denies AVH . Pt experienced SI because of severe chronic pain in his feet (nerve pain)  Pt has Past medical Hx of Depression, HTN. BPH, vitamin B12 deficiency and suicidal ideation. Patient explained to staff he was experiencing worsening depression; feeling worthless, guilt, anhedonia, and fatigue. Patient is currently receiving social security benefits. Patient's skin was assessed in presence of Daria RN  and found to be clear of any abnormal marks.Patient was also searched and no contraband found, POC and unit policies explained and understanding verbalized. Consents obtained. Food and fluids offered, and both accepted. 15 minutes checks maintained will continue to monitor.

## 2017-06-20 NOTE — Progress Notes (Addendum)
Pt was in a pleasant mood. Denies SI, HI or A/V hallucinations. Pt was med compliant, no adverse affects noted. Pt requested Trazodone 100 mg po PRN for sleep, positive effect noted. No distress noted. 15 min safety checks continue. Pt slept 7.75 hours.

## 2017-06-20 NOTE — H&P (Signed)
Psychiatric Admission Assessment Adult  Patient Identification: Joel Gross MRN:  [id number] Date of Evaluation:  06/20/2017 Chief Complaint:  Major depressive disorder, single episode, severe without psychosis Principal Diagnosis: Major depressive disorder, single episode, severe without psychosis ([place]) Diagnosis:   Patient Active Problem List   Diagnosis Date Noted  . Major depressive disorder, single episode, severe without psychosis ([place]) [F32.2] 06/19/2017  . Suicidal ideation [R45.851] 06/19/2017  . BPH (benign prostatic hyperplasia) [N40.0] 06/19/2017  . Chronic pain syndrome [G89.4] 06/19/2017  . Vitamin B12 deficiency [E53.8] 06/19/2017  . Major depressive disorder, single episode, severe with psychotic features ([place]) [F32.3] 06/19/2017   History of Present Illness: 66 year old man referred from outside hospital. Patient reports that he has had some symptoms of depression for several months but that things only really seeme to pile up to a point of severity about a month or so ago. His mother died in 05-29-2023 of this year and this left him the executor of her of her house. This has caused some stress within his immediate family including his sister. On top of his is his doctors had been cutting him back on his pain medicine. He says over the last couple weeks he began to feel overwhelmed and was feeling hopeless. During that time he was sleeping okay but his appetite dropped off. Hes no longer enjoying normal activation. Over the last several days he had started to develop frequent suicidal thoughts. Contemplated shooting himself with his gun or running his car off the road. Patient called the suicide hotline and they referred him to local mental healthwho referred him to the hospital which is how he wound up with [name]. Prior to coming to the hospital he had not been seeing anyone specifically for depression although he had been taking some medicines that could be helping with depression  symptoms  Patient is married. He is retired. Has extended family in adult children who live nearby. Recently some stress within the family related to the death of the patient's mother. Patient has chronic pain condition including back pain andperipheral neuropathy. Denies any substance abuse actively Associated Signs/Symptoms: Depression Symptoms:  depressed mood, anhedonia, psychomotor retardation, feelings of worthlessness/guilt, difficulty concentrating, hopelessness, suicidal thoughts with specific plan, (Hypo) Manic Symptoms:  none Anxiety Symptoms:  Excessive Worry, Psychotic Symptoms:  patient says he thinks he sometimes has visual hallucinations but what he describes are very trivial and are probably just the result of sleep problems PTSD Symptoms: Negative Total Time spent with patient: 1 hour  Past Psychiatric History: patient reports he had never seen a psychiatrist before and never had any counseling. No past suicide attempts no past hospitalizatin. Nevertheless he was taking medicines that are used in psychiatry. He was on 75 mg of amitriptyline at night for chronic pain and sleep. He has a past history of a trial of Cymbalta for his chronic pain and recently had been taking a low dose of Wellbutrin.  Is the patient at risk to self? Yes.    Has the patient been a risk to self in the past 6 months? Yes.    Has the patient been a risk to self within the distant past? No.  Is the patient a risk to others? No.  Has the patient been a risk to others in the past 6 months? No.  Has the patient been a risk to others within the distant past? No.   Prior Inpatient Therapy:   Prior Outpatient Therapy:    Alcohol Screening: 1. How often  do you have a drink containing alcohol?: Never 2. How many drinks containing alcohol do you have on a typical day when you are drinking?: 1 or 2 3. How often do you have six or more drinks on one occasion?: Never Preliminary Score: 0 4. How often  during the last year have you found that you were not able to stop drinking once you had started?: Never 5. How often during the last year have you failed to do what was normally expected from you becasue of drinking?: Never 6. How often during the last year have you needed a first drink in the morning to get yourself going after a heavy drinking session?: Never 7. How often during the last year have you had a feeling of guilt of remorse after drinking?: Never 8. How often during the last year have you been unable to remember what happened the night before because you had been drinking?: Never 9. Have you or someone else been injured as a result of your drinking?: No 10. Has a relative or friend or a doctor or another health worker been concerned about your drinking or suggested you cut down?: No Alcohol Use Disorder Identification Test Final Score (AUDIT): 0 Brief Intervention: AUDIT score less than 7 or less-screening does not suggest unhealthy drinking-brief intervention not indicated Substance Abuse History in the last 12 months:  No. Consequences of Substance Abuse: Negative Previous Psychotropic Medications: Yes  Psychological Evaluations: No  Past Medical History:  Past Medical History:  Diagnosis Date  . Neuropathy    History reviewed. No pertinent surgical history. Family History: History reviewed. No pertinent family history. Family Psychiatric  History: denies knowledge of atory Tobacco Screening:   Social History:  History  Alcohol Use No     History  Drug Use No    Additional Social History:                           Allergies:  No Known Allergies Lab Results:  Results for orders placed or performed during the hospital encounter of 06/19/17 (from the past 48 hour(s))  Lipid panel     Status: Abnormal   Collection Time: 06/20/17  7:56 AM  Result Value Ref Range   Cholesterol 140 0 - 200 mg/dL   Triglycerides 106 <150 mg/dL   HDL 29 (L) >40 mg/dL   Total  CHOL/HDL Ratio 4.8 RATIO   VLDL 21 0 - 40 mg/dL   LDL Cholesterol 90 0 - 99 mg/dL    Comment:        Total Cholesterol/HDL:CHD Risk Coronary Heart Disease Risk Table                     Men   Women  1/2 Average Risk   3.4   3.3  Average Risk       5.0   4.4  2 X Average Risk   9.6   7.1  3 X Average Risk  23.4   11.0        Use the calculated Patient Ratio above and the CHD Risk Table to determine the patient's CHD Risk.        ATP III CLASSIFICATION (LDL):  <100     mg/dL   Optimal  100-129  mg/dL   Near or Above                    Optimal  130-159  mg/dL  Borderline  160-189  mg/dL   High  >190     mg/dL   Very High   TSH     Status: None   Collection Time: 06/20/17  7:56 AM  Result Value Ref Range   TSH 0.830 0.350 - 4.500 uIU/mL    Comment: Performed by a 3rd Generation assay with a functional sensitivity of <=0.01 uIU/mL.    Blood Alcohol level:  Lab Results  Component Value Date   ETH <5 24/40/1027    Metabolic Disorder Labs:  No results found for: HGBA1C, MPG No results found for: PROLACTIN Lab Results  Component Value Date   CHOL 140 06/20/2017   TRIG 106 06/20/2017   HDL 29 (L) 06/20/2017   CHOLHDL 4.8 06/20/2017   VLDL 21 06/20/2017   LDLCALC 90 06/20/2017    Current Medications: Current Facility-Administered Medications  Medication Dose Route Frequency Provider Last Rate Last Dose  . acetaminophen (TYLENOL) tablet 650 mg  650 mg Oral Q6H PRN Pucilowska, Jolanta B, MD      . alum & mag hydroxide-simeth (MAALOX/MYLANTA) 200-200-20 MG/5ML suspension 30 mL  30 mL Oral Q4H PRN Pucilowska, Jolanta B, MD      . amitriptyline (ELAVIL) tablet 75 mg  75 mg Oral QHS Karriem Muench T, MD      . amLODipine (NORVASC) tablet 10 mg  10 mg Oral Daily Pucilowska, Jolanta B, MD   10 mg at 06/20/17 2536  . buPROPion (WELLBUTRIN XL) 24 hr tablet 150 mg  150 mg Oral Daily Pucilowska, Jolanta B, MD   150 mg at 06/20/17 0848  . cyclobenzaprine (FLEXERIL) tablet 10 mg  10  mg Oral QHS Pucilowska, Jolanta B, MD   10 mg at 06/19/17 2258  . DULoxetine (CYMBALTA) DR capsule 60 mg  60 mg Oral Daily Pucilowska, Jolanta B, MD   60 mg at 06/20/17 0848  . HYDROcodone-acetaminophen (NORCO) 7.5-325 MG per tablet 1 tablet  1 tablet Oral TID AC Milayah Krell T, MD      . lisinopril (PRINIVIL,ZESTRIL) tablet 40 mg  40 mg Oral Daily Pucilowska, Jolanta B, MD   40 mg at 06/20/17 0636  . magnesium hydroxide (MILK OF MAGNESIA) suspension 30 mL  30 mL Oral Daily PRN Pucilowska, Jolanta B, MD      . potassium chloride (KLOR-CON) packet 20 mEq  20 mEq Oral BID Havana Baldwin T, MD      . pravastatin (PRAVACHOL) tablet 20 mg  20 mg Oral q1800 Pucilowska, Jolanta B, MD      . pregabalin (LYRICA) capsule 200 mg  200 mg Oral TID Pucilowska, Jolanta B, MD   200 mg at 06/20/17 1223  . traZODone (DESYREL) tablet 100 mg  100 mg Oral QHS PRN Pucilowska, Jolanta B, MD   100 mg at 06/19/17 2257   PTA Medications: Prescriptions Prior to Admission  Medication Sig Dispense Refill Last Dose  . ALPRAZolam (XANAX) 0.25 MG tablet Take 0.25 mg by mouth at bedtime.   06/18/2017 at Unknown time  . amitriptyline (ELAVIL) 25 MG tablet Take 75 mg by mouth at bedtime.   06/18/2017 at Unknown time  . amLODipine (NORVASC) 10 MG tablet Take 10 mg by mouth daily.   06/18/2017 at Unknown time  . B Complex-C (B-COMPLEX WITH VITAMIN C) tablet Take 1 tablet by mouth daily.   06/18/2017 at Unknown time  . buPROPion (WELLBUTRIN XL) 150 MG 24 hr tablet Take 150 mg by mouth at bedtime.   06/18/2017 at Unknown time  .  cyclobenzaprine (FLEXERIL) 10 MG tablet Take 10 mg by mouth at bedtime.   06/18/2017 at Unknown time  . HYDROcodone-acetaminophen (NORCO) 7.5-325 MG tablet Take 1 tablet by mouth 3 (three) times daily.   06/18/2017 at Unknown time  . hydroxypropyl methylcellulose / hypromellose (ISOPTO TEARS / GONIOVISC) 2.5 % ophthalmic solution Place 2 drops into both eyes 3 (three) times daily as needed for dry eyes.   unk  .  lisinopril (PRINIVIL,ZESTRIL) 40 MG tablet Take 40 mg by mouth daily.   06/17/2017 at Unknown time  . Multiple Vitamin (MULTIVITAMIN WITH MINERALS) TABS tablet Take 1 tablet by mouth daily.   06/18/2017 at Unknown time  . potassium chloride SA (K-DUR,KLOR-CON) 20 MEQ tablet Take 20 mEq by mouth 2 (two) times daily.   06/18/2017 at am  . pravastatin (PRAVACHOL) 20 MG tablet Take 20 mg by mouth daily.   06/18/2017 at Unknown time  . pregabalin (LYRICA) 100 MG capsule Take 100 mg by mouth 3 (three) times daily.   06/18/2017 at Unknown time    Musculoskeletal: Strength & Muscle Tone: within normal limits Gait & Station: normal Patient leans: N/A  Psychiatric Specialty Exam: Physical Exam  Nursing note and vitals reviewed. Constitutional: He appears well-developed and well-nourished.  HENT:  Head: Normocephalic and atraumatic.  Eyes: Pupils are equal, round, and reactive to light. Conjunctivae are normal.  Neck: Normal range of motion.  Cardiovascular: Regular rhythm and normal heart sounds.   Respiratory: Effort normal. No respiratory distress.  GI: Soft.  Musculoskeletal: Normal range of motion.  Neurological: He is alert.  Skin: Skin is warm and dry.  Psychiatric: His mood appears anxious. His affect is blunt. His speech is delayed. He is slowed. Cognition and memory are normal. He expresses impulsivity. He exhibits a depressed mood. He expresses suicidal ideation. He expresses no suicidal plans.    Review of Systems  Constitutional: Negative.   HENT: Negative.   Eyes: Negative.   Respiratory: Negative.   Cardiovascular: Negative.   Gastrointestinal: Negative.   Musculoskeletal: Negative.   Skin: Negative.   Neurological: Positive for tingling and sensory change.  Psychiatric/Behavioral: Positive for depression, hallucinations and suicidal ideas. Negative for memory loss and substance abuse. The patient is nervous/anxious. The patient does not have insomnia.     Blood pressure (!)  154/100, pulse 82, temperature 97.8 F (36.6 C), temperature source Oral, resp. rate 18, height 6\' 1"  (1.854 m), weight 99.5 kg (219 lb 6.4 oz), SpO2 97 %.Body mass index is 28.95 kg/m.  General Appearance: Casual  Eye Contact:  Good  Speech:  Slow  Volume:  Decreased  Mood:  Depressed  Affect:  Congruent  Thought Process:  Goal Directed  Orientation:  Full (Time, Place, and Person)  Thought Content:  Logical and Rumination  Suicidal Thoughts:  Yes.  without intent/plan  Homicidal Thoughts:  No  Memory:  Immediate;   Good Recent;   Good Remote;   Good  Judgement:  Fair  Insight:  Fair  Psychomotor Activity:  Decreased  Concentration:  Concentration: Fair  Recall:  [place] of Knowledge:  Fair  Language:  Fair  Akathisia:  No  Handed:  Right  AIMS (if indicated):     Assets:  Communication Skills Desire for Improvement Financial Resources/Insurance Housing Leisure Time Resilience Social Support  ADL's:  Intact  Cognition:  WNL  Sleep:  Number of Hours: 6.15    Treatment Plan Summary: Daily contact with patient to assess and evaluate symptoms and progress in treatment,  Medication management and Plan reviewed medicines that he had been taking previously. I agreed to restart his Lyrica 200 mg 3 times a day as there didn't seem to be any real reason for cutting back on that. Also continue the amitriptyline 75 mg at night. Checking the controlled substance database I confirmed that he had been increased on his hydrocodone back up to 3 times a day so I put that in as a standing order. As far as his depression I am going to try restarting and continuing the Cymbalta that he had taken in the past. I will not continue the Wellbutrin for now as it did not appear to be helping and is otherwise not clearly indicated. Engage in daily groups and therapy and receive social workevaluation. Patient understands and agrees to plan.  Observation Level/Precautions:  15 minute checks   Laboratory:  UDS  Psychotherapy:    Medications:    Consultations:    Discharge Concerns:    Estimated LOS:  Other:     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[place]) Long Term Goal(s): Improvement in symptoms so as ready for discharge and show good insight into pain management and stressors and agreed to long-term treatment  Short Term Goals: Ability to verbalize feelings will improve and Ability to disclose and discuss suicidal ideas  Physician Treatment Plan for Secondary Diagnosis: Principal Problem:   Major depressive disorder, single episode, severe without psychosis ([place]) Active Problems:   Suicidal ideation   BPH (benign prostatic hyperplasia)   Chronic pain syndrome   Vitamin B12 deficiency   Major depressive disorder, single episode, severe with psychotic features ([place])  Long Term Goal(s): Improvement in symptoms so as ready for discharge  Short Term Goals: Ability to demonstrate self-control will improve, Ability to identify and develop effective coping behaviors will improve and Ability to maintain clinical measurements within normal limits will improve  I certify that inpatient services furnished can reasonably be expected to improve the patient's condition.    Alethia Berthold, MD 8/18/20183:10 PM

## 2017-06-20 NOTE — BHH Suicide Risk Assessment (Signed)
[facility] Admission Suicide Risk Assessment   Nursing information obtained from:   review of chart here and from other facilities Demographic factors:   patient is male, over 63 does have positive family support Current Mental Status:   depressed but insightful not actively psychotic no intent of suicide Loss Factors:   recent death of his mother Historical Factors:   no past psychiatric history Risk Reduction Factors:   positive support from family  Total Time spent with patient: 1 hour Principal Problem: Major depressive disorder, single episode, severe without psychosis ([place]) Diagnosis:   Patient Active Problem List   Diagnosis Date Noted  . Major depressive disorder, single episode, severe without psychosis ([place]) [F32.2] 06/19/2017  . Suicidal ideation [R45.851] 06/19/2017  . BPH (benign prostatic hyperplasia) [N40.0] 06/19/2017  . Chronic pain syndrome [G89.4] 06/19/2017  . Vitamin B12 deficiency [E53.8] 06/19/2017  . Major depressive disorder, single episode, severe with psychotic features ([place]) [F32.3] 06/19/2017   Subjective Data: patient reports that he recently had suicidal thoughts and contemplated both shooting himself and crashing his car but reached out on his own initiative for help. He has multiple symptoms of depression but currently denies suicidal intent or plan. He is agreeable and cooperative with treatment plan.  Continued Clinical Symptoms:  Alcohol Use Disorder Identification Test Final Score (AUDIT): 0 The "Alcohol Use Disorders Identification Test", Guidelines for Use in Primary Care, Second Edition.  World Pharmacologist [facility]). Score between 0-7:  no or low risk or alcohol related problems. Score between 8-15:  moderate risk of alcohol related problems. Score between 16-19:  high risk of alcohol related problems. Score 20 or above:  warrants further diagnostic evaluation for alcohol dependence and treatment.   CLINICAL FACTORS:   Depression:    Hopelessness Chronic Pain   Musculoskeletal: Strength & Muscle Tone: within normal limits Gait & Station: normal Patient leans: N/A  Psychiatric Specialty Exam: Physical Exam  Nursing note and vitals reviewed. Constitutional: He appears well-developed and well-nourished.  HENT:  Head: Normocephalic and atraumatic.  Eyes: Pupils are equal, round, and reactive to light. Conjunctivae are normal.  Neck: Normal range of motion.  Cardiovascular: Normal heart sounds.   Respiratory: Effort normal.  GI: Soft.  Musculoskeletal: Normal range of motion.  Neurological: He is alert.  Skin: Skin is warm and dry.  Psychiatric: Judgment normal. His speech is delayed. He is slowed. Cognition and memory are normal. He exhibits a depressed mood. He expresses suicidal ideation. He expresses no suicidal plans.    Review of Systems  Constitutional: Negative.   HENT: Negative.   Eyes: Negative.   Respiratory: Negative.   Cardiovascular: Negative.   Gastrointestinal: Negative.   Musculoskeletal: Positive for back pain.  Skin: Negative.   Neurological: Positive for tingling.  Psychiatric/Behavioral: Positive for depression, hallucinations and suicidal ideas. Negative for memory loss and substance abuse. The patient is nervous/anxious. The patient does not have insomnia.     Blood pressure (!) 154/100, pulse 82, temperature 97.8 F (36.6 C), temperature source Oral, resp. rate 18, height 6\' 1"  (1.854 m), weight 99.5 kg (219 lb 6.4 oz), SpO2 97 %.Body mass index is 28.95 kg/m.  General Appearance: Fairly Groomed  Eye Contact:  Good  Speech:  Clear and Coherent  Volume:  Normal  Mood:  Depressed  Affect:  Appropriate  Thought Process:  Goal Directed  Orientation:  Full (Time, Place, and Person)  Thought Content:  Logical  Suicidal Thoughts:  Yes.  without intent/plan  Homicidal Thoughts:  No  Memory:  Immediate;   Good Recent;   Fair Remote;   Fair  Judgement:  Impaired  Insight:  Fair   Psychomotor Activity:  Decreased  Concentration:  Concentration: Fair  Recall:  [employer] of Knowledge:  Fair  Language:  Fair  Akathisia:  No  Handed:  Right  AIMS (if indicated):     Assets:  Desire for Improvement Housing Social Support  ADL's:  Intact  Cognition:  WNL  Sleep:  Number of Hours: 6.15      COGNITIVE FEATURES THAT CONTRIBUTE TO RISK:  Closed-mindedness    SUICIDE RISK:   Mild:  Suicidal ideation of limited frequency, intensity, duration, and specificity.  There are no identifiable plans, no associated intent, mild dysphoria and related symptoms, good self-control (both objective and subjective assessment), few other risk factors, and identifiable protective factors, including available and accessible social support.  PLAN OF CARE: patient admitted to psychiatric ward. 15 minute checks. Initiating antidepressant treatment. Working with him on treating his pain disorder as well. Engage in daily groups. Ongoing reassessment before discharge  I certify that inpatient services furnished can reasonably be expected to improve the patient's condition.   Alethia Berthold, MD 06/20/2017, 3:05 PM

## 2017-06-20 NOTE — Plan of Care (Signed)
Problem: Safety: Goal: Periods of time without injury will increase Outcome: Progressing Patient remains safe and without injury during hospitalization and on Q 15 minute observation. Will continue to monitor patient.    

## 2017-06-20 NOTE — Plan of Care (Signed)
Problem: Coping: Goal: Ability to cope will improve Outcome: Progressing Pt will use at least one of his coping skills for the next 2 shifts.

## 2017-06-20 NOTE — Tx Team (Signed)
Initial Treatment Plan 06/20/2017 2:04 AM [name] OYW:[id number]    PATIENT STRESSORS: Health problems Marital or family conflict   PATIENT STRENGTHS: Ability for insight Capable of independent living Motivation for treatment/growth Supportive family/friends   PATIENT IDENTIFIED PROBLEMS: Depression     Suicidal Ideation                 DISCHARGE CRITERIA:  Improved stabilization in mood, thinking, and/or behavior Medical problems require only outpatient monitoring  PRELIMINARY DISCHARGE PLAN: Outpatient therapy Return to previous living arrangement  PATIENT/FAMILY INVOLVEMENT: This treatment plan has been presented to and reviewed with the patient, Joel Gross,  The patient and family have been given the opportunity to ask questions and make suggestions.  Ailene Rud Stephanieann Popescu, RN 06/20/2017, 2:04 AM

## 2017-06-20 NOTE — Progress Notes (Signed)
Patient was visible on the unit at times interacting with others. Patient is compliant with medications and meals. Patient denies any SI/HI/AH/VH. Patient is alert and oriented x 4, breathing unlabored, and extremities x 4 within normal limits. Patient is calm and cooperative. Patient did not display any disruptive behavior. Patient continues to be monitored on 15 minute safety checks. Will continue to monitor patient and notify MD of any changes.

## 2017-06-20 NOTE — BHH Group Notes (Signed)
[facility] Group Notes:  (Nursing/MHT/Case Management/Adjunct)  Date:  06/20/2017  Time:  9:25 PM  Type of Therapy:  Evening Wrap-up Group  Participation Level:  Active  Participation Quality:  Appropriate and Attentive  Affect:  Appropriate  Cognitive:  Alert and Appropriate  Insight:  Appropriate, Good and Improving  Engagement in Group:  Developing/Improving and Engaged  Modes of Intervention:  Discussion  Summary of Progress/Problems:  Levonne Spiller 06/20/2017, 9:25 PM

## 2017-06-21 MED ORDER — BIOTENE DRY MOUTH MT LIQD: 15.0000 mL | OROMUCOSAL | Status: DC | PRN

## 2017-06-21 MED ORDER — POLYETHYLENE GLYCOL 3350 17 G PO PACK
17.0000 g | PACK | Freq: Every day | ORAL | Status: DC
  Administered 2017-06-21 – 2017-06-26 (×6): 17 g via ORAL
  Filled 2017-06-21 (×6): qty 1

## 2017-06-21 NOTE — Plan of Care (Signed)
Problem: Safety: Goal: Periods of time without injury will increase Outcome: Progressing Patient remains safe and without injury during hospitalization and on Q 15 minute observation. Will continue to monitor patient.    

## 2017-06-21 NOTE — Progress Notes (Addendum)
Pt is very pleasant. Denies depression, SI, HI or A/V hallucinations at this time. Pt has appropriate interaction with staff and peers. Pt did go to group. Pt is med compliant, no adverse affects noted. Pt had c/o of insomnia, Trazodone 100 mg tab po given. Reassessed pt, positive effect noted, pt in room with eyes closed resting.  No distress noted. 15 min safety checks continue. Pt slept for 7 hrs  .

## 2017-06-21 NOTE — BHH Group Notes (Signed)
[facility] LCSW Group Therapy  06/21/2017 1:58 PM  Type of Therapy:  Group Therapy  Participation Level:  Active  Participation Quality:  Appropriate and Sharing  Affect:  Appropriate  Cognitive:  Alert  Insight:  Improving  Engagement in Therapy:  Engaged  Modes of Intervention:  Activity, Clarification, Discussion, Education, Exploration, Socialization and Support  Summary of Progress/Problems: Coping Skills: Patients defined and discussed healthy coping skills. Patients identified healthy coping skills they would like to try during hospitalization and after discharge. CSW offered insight to varying coping skills that may have been new to patients such as practicing mindfulness.Patient actively engaged in the mindfulness medication. Patient shared that he utilizes mediation to help with anxiety and pain management.   Stacey Maura G. Claybon Jabs MSW, [place] 06/21/2017, 2:02 PM

## 2017-06-21 NOTE — BHH Counselor (Signed)
Adult Comprehensive Assessment  Patient ID: Joel Gross, male   DOB: 1950-12-03, 66 y.o.   MRN: [id number]  Information Source: Information source: Patient  Current Stressors:  Educational / Learning stressors: n/a Employment / Job issues: Pt is retired since Dec. 2013 Housing / Lack of housing: n/a Physical health (include injuries & life threatening diseases): n/a Social relationships: n/a Substance abuse: Patient denies Bereavement / Loss: Pt lost his mother in June 2018.   Living/Environment/Situation:  Living Arrangements: Spouse/significant other Living conditions (as described by patient or guardian): Pt states that is is fine How long has patient lived in current situation?: Pt has lived with is wife for 45 years. What is atmosphere in current home: Comfortable, Loving, Supportive  Family History:  Marital status: Married Number of Years Married: 32 What types of issues is patient dealing with in the relationship?: Pt states he needs to be more positive  Additional relationship information: n/a Are you sexually active?: Yes What is your sexual orientation?: heterosexual Has your sexual activity been affected by drugs, alcohol, medication, or emotional stress?: n/a Does patient have children?: Yes How many children?: 3 How is patient's relationship with their children?: 2 girls 1 son. Pt has a good relationship with   Childhood History:  By whom was/is the patient raised?: Both parents Additional childhood history information: n/a Description of patient's relationship with caregiver when they were a child: Pt states he had a good childhood.  Patient's description of current relationship with people who raised him/her: Mother passed away in June 14, 2017. Father is deceased.  How were you disciplined when you got in trouble as a child/adolescent?: n/a Does patient have siblings?: Yes Number of Siblings: 2 Description of patient's current relationship with siblings: 1  brother who is deceased. 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 ) Has patient ever served in combat?: No Did You Receive Any Psychiatric Treatment/Services While in the [employer]?: No Are There Guns or Other Weapons in [place]?: Yes Types of Guns/Weapons: Architect Secured?: Yes (Daughter has the gun at her house. )  Museum/gallery curator Resources:   Financial resources: Income from spouse, Medicare Does patient have a representative payee or guardian?: No  Alcohol/Substance Abuse:   What has been your use of drugs/alcohol within the last 12 months?: Patient denies If attempted suicide, did drugs/alcohol play a role in this?: No Alcohol/Substance Abuse Treatment Hx: Denies past history Has alcohol/substance abuse ever caused legal problems?: No  Social Support System:   Patient's Community Support System: Good Describe Community Support System: Patient has support from his wife and children. 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  Discharge Plan:   Does patient have access to transportation?: Yes Will patient be returning to same living situation after discharge?: Yes Currently receiving community mental health services: No If no, would patient like referral for services when discharged?: Yes (What county?) [facility] [place]. ) Does patient have financial barriers related to discharge medications?: No  Summary/Recommendations:   Patient is a 66 year old male admitted voluntarily with a diagnosis of Major depressive disorder, single episode, severe without psychosis. Information was obtained from psychosocial assessment completed with patient and chart review conducted by this evaluator. Patient presented to the hospital [facility] in [place] reporting worsening depression. Patient reports primary triggers for admission were grief from the loss of his mother in July and financial disagreements with family. Patient would like to be connected to a psychiatrist and therapist at discharge. Patient will benefit from crisis stabilization, medication evaluation, group therapy and psycho education in addition to case management for discharge. At discharge, it is recommended that patient remain compliant with established discharge plan and continued treatment.    Tony Friscia G. Claybon Jabs MSW, [place] 06/21/2017 3:08 PM

## 2017-06-21 NOTE — Plan of Care (Signed)
Problem: Coping: Goal: Ability to cope will improve Outcome: Progressing Pt will remain calm for the entire shift.

## 2017-06-21 NOTE — Progress Notes (Signed)
[facility] MD Progress Note  06/21/2017 1:51 PM Joel Gross  MRN:  [id number] Subjective: Principal Problem: Major depressive disorder, single episode, severe without psychosis ([place]) Diagnosis:   Patient Active Problem List   Diagnosis Date Noted  . Major depressive disorder, single episode, severe without psychosis ([place]) [F32.2] 06/19/2017  . Suicidal ideation [R45.851] 06/19/2017  . BPH (benign prostatic hyperplasia) [N40.0] 06/19/2017  . Chronic pain syndrome [G89.4] 06/19/2017  . Vitamin B12 deficiency [E53.8] 06/19/2017  . Major depressive disorder, single episode, severe with psychotic features ([place]) [F32.3] 06/19/2017   Total Time spent with patient: 30 minutes   Progress note before Sunday the 19th. Patient says he slept better.Talks about mood still being anxious and also talks about how he sometimes has "bad thoughts". Seems to indicate that he has a certain degree of obsessiveness to him. Clearly indicates that he is still feeling down. Behavior has been calm and appropriate. Blood pressure today was elevated when he got up. Physically he is complaining of chronic constipation and dry mouth for which she usually has treatment at home.  Past Psychiatric History:first hospitalization. No prior psychiatric treatment. Past medication largely a pain Past Medical History:  Past Medical History:  Diagnosis Date  . Neuropathy    History reviewed. No pertinent surgical history. Family History: History reviewed. No pertinent family history. Family Psychiatric  History: none Social History:  History  Alcohol Use No     History  Drug Use No    Social History   Social History  . Marital status: Married    Spouse name: N/A  . Number of children: N/A  . Years of education: N/A   Social History Main Topics  . Smoking status: Never Smoker  . Smokeless tobacco: Never Used  . Alcohol use No  . Drug use: No  . Sexual activity: Not Asked   Other Topics Concern  . None   Social  History Narrative  . None   Additional Social History:                         Sleep: Fair  Appetite:  Fair  Current Medications: Current Facility-Administered Medications  Medication Dose Route Frequency Provider Last Rate Last Dose  . acetaminophen (TYLENOL) tablet 650 mg  650 mg Oral Q6H PRN Pucilowska, Jolanta B, MD      . alum & mag hydroxide-simeth (MAALOX/MYLANTA) 200-200-20 MG/5ML suspension 30 mL  30 mL Oral Q4H PRN Pucilowska, Jolanta B, MD      . amitriptyline (ELAVIL) tablet 75 mg  75 mg Oral QHS Shawnie Nicole, Madie Reno, MD   75 mg at 06/20/17 2104  . amLODipine (NORVASC) tablet 10 mg  10 mg Oral Daily Pucilowska, Jolanta B, MD   10 mg at 06/21/17 0640  . antiseptic oral rinse (BIOTENE) solution 15 mL  15 mL Mouth Rinse PRN Inara Dike T, MD      . buPROPion (WELLBUTRIN XL) 24 hr tablet 150 mg  150 mg Oral Daily Pucilowska, Jolanta B, MD   150 mg at 06/21/17 0835  . cyclobenzaprine (FLEXERIL) tablet 10 mg  10 mg Oral QHS Pucilowska, Jolanta B, MD   10 mg at 06/20/17 2105  . DULoxetine (CYMBALTA) DR capsule 60 mg  60 mg Oral Daily Pucilowska, Jolanta B, MD   60 mg at 06/21/17 0835  . HYDROcodone-acetaminophen (NORCO) 7.5-325 MG per tablet 1 tablet  1 tablet Oral TID AC Kiauna Zywicki, Madie Reno, MD   1 tablet at 06/21/17  1211  . lisinopril (PRINIVIL,ZESTRIL) tablet 40 mg  40 mg Oral Daily Pucilowska, Jolanta B, MD   40 mg at 06/21/17 0639  . magnesium hydroxide (MILK OF MAGNESIA) suspension 30 mL  30 mL Oral Daily PRN Pucilowska, Jolanta B, MD      . polyethylene glycol (MIRALAX / GLYCOLAX) packet 17 g  17 g Oral Daily Marlina Cataldi T, MD      . potassium chloride (KLOR-CON) packet 20 mEq  20 mEq Oral BID Elisheba Mcdonnell, Madie Reno, MD   20 mEq at 06/21/17 0835  . pravastatin (PRAVACHOL) tablet 20 mg  20 mg Oral q1800 Pucilowska, Jolanta B, MD   20 mg at 06/20/17 1738  . pregabalin (LYRICA) capsule 200 mg  200 mg Oral TID Pucilowska, Jolanta B, MD   200 mg at 06/21/17 1211  . traZODone  (DESYREL) tablet 100 mg  100 mg Oral QHS PRN Pucilowska, Jolanta B, MD   100 mg at 06/20/17 2106    Lab Results:  Results for orders placed or performed during the hospital encounter of 06/19/17 (from the past 48 hour(s))  Hemoglobin A1c     Status: None   Collection Time: 06/20/17  7:56 AM  Result Value Ref Range   Hgb A1c MFr Bld 5.5 4.8 - 5.6 %    Comment: (NOTE) Pre diabetes:          5.7%-6.4% Diabetes:              >6.4% Glycemic control for   <7.0% adults with diabetes    Mean Plasma Glucose 111.15 mg/dL    Comment: Performed at [facility], [place] [address], [place], [place] [address]  Lipid panel     Status: Abnormal   Collection Time: 06/20/17  7:56 AM  Result Value Ref Range   Cholesterol 140 0 - 200 mg/dL   Triglycerides 106 <150 mg/dL   HDL 29 (L) >40 mg/dL   Total CHOL/HDL Ratio 4.8 RATIO   VLDL 21 0 - 40 mg/dL   LDL Cholesterol 90 0 - 99 mg/dL    Comment:        Total Cholesterol/HDL:CHD Risk Coronary Heart Disease Risk Table                     Men   Women  1/2 Average Risk   3.4   3.3  Average Risk       5.0   4.4  2 X Average Risk   9.6   7.1  3 X Average Risk  23.4   11.0        Use the calculated Patient Ratio above and the CHD Risk Table to determine the patient's CHD Risk.        ATP III CLASSIFICATION (LDL):  <100     mg/dL   Optimal  100-129  mg/dL   Near or Above                    Optimal  130-159  mg/dL   Borderline  160-189  mg/dL   High  >190     mg/dL   Very High   TSH     Status: None   Collection Time: 06/20/17  7:56 AM  Result Value Ref Range   TSH 0.830 0.350 - 4.500 uIU/mL    Comment: Performed by a 3rd Generation assay with a functional sensitivity of <=0.01 uIU/mL.    Blood Alcohol level:  Lab Results  Component Value  Date   ETH <5 16/11/930    Metabolic Disorder Labs: Lab Results  Component Value Date   HGBA1C 5.5 06/20/2017   MPG 111.15 06/20/2017   No results found for: PROLACTIN Lab Results  Component  Value Date   CHOL 140 06/20/2017   TRIG 106 06/20/2017   HDL 29 (L) 06/20/2017   CHOLHDL 4.8 06/20/2017   VLDL 21 06/20/2017   LDLCALC 90 06/20/2017    Physical Findings: AIMS: Facial and Oral Movements Muscles of Facial Expression: None, normal Lips and Perioral Area: None, normal Jaw: None, normal Tongue: None, normal,Extremity Movements Upper (arms, wrists, hands, fingers): None, normal Lower (legs, knees, ankles, toes): None, normal, Trunk Movements Neck, shoulders, hips: None, normal, Overall Severity Severity of abnormal movements (highest score from questions above): None, normal Patient's awareness of abnormal movements (rate only patient's report): No Awareness, Dental Status Current problems with teeth and/or dentures?: No Does patient usually wear dentures?: No  CIWA:  CIWA-Ar Total: 0 COWS:  COWS Total Score: 2  Musculoskeletal: Strength & Muscle Tone: within normal limits Gait & Station: normal Patient leans: N/A  Psychiatric Specialty Exam: Physical Exam  Nursing note and vitals reviewed. Constitutional: He appears well-developed and well-nourished.  HENT:  Head: Normocephalic and atraumatic.  Eyes: Pupils are equal, round, and reactive to light. Conjunctivae are normal.  Neck: Normal range of motion.  Cardiovascular: Regular rhythm and normal heart sounds.   Respiratory: Effort normal. No respiratory distress.  GI: Soft.  Musculoskeletal: Normal range of motion.  Neurological: He is alert.  Skin: Skin is warm and dry.  Psychiatric: His speech is normal. Judgment normal. His mood appears anxious. He is withdrawn. Cognition and memory are impaired. He exhibits a depressed mood. He expresses no suicidal ideation.    Review of Systems  Constitutional: Negative.   HENT: Negative.   Eyes: Negative.   Respiratory: Negative.   Cardiovascular: Negative.   Gastrointestinal: Positive for constipation.  Musculoskeletal: Negative.   Skin: Negative.    Neurological: Negative.   Psychiatric/Behavioral: Positive for depression and suicidal ideas. Negative for hallucinations, memory loss and substance abuse. The patient is nervous/anxious. The patient does not have insomnia.     Blood pressure (!) 150/90, pulse 98, temperature 97.6 F (36.4 C), temperature source Oral, resp. rate 18, height 6\' 1"  (1.854 m), weight 99.5 kg (219 lb 6.4 oz), SpO2 97 %.Body mass index is 28.95 kg/m.  General Appearance: Fairly Groomed  Eye Contact:  Minimal  Speech:  Slow  Volume:  Decreased  Mood:  Anxious and Depressed  Affect:  Blunt  Thought Process:  Goal Directed  Orientation:  Full (Time, Place, and Person)  Thought Content:  Rumination  Suicidal Thoughts:  Yes.  without intent/plan  Homicidal Thoughts:  No  Memory:  Immediate;   Good Recent;   Fair Remote;   Fair  Judgement:  Fair  Insight:  Fair  Psychomotor Activity:  Decreased  Concentration:  Concentration: Fair  Recall:  [employer] of Knowledge:  Fair  Language:  Fair  Akathisia:  No  Handed:  Right  AIMS (if indicated):     Assets:  Communication Skills Desire for Improvement Financial Resources/Insurance Housing Resilience Social Support  ADL's:  Intact  Cognition:  WNL  Sleep:  Number of Hours: 7.45     Treatment Plan Summary: Daily contact with patient to assess and evaluate symptoms and progress in treatment, Medication management and Plan as far as his depression and anxiety, I reviewed his medicinid not make  a change today. He says that in the past he had trouble tolerating Cymbalta but has had no side effects from it today. Primary treatment team can reevaluate medication for anxiety and depression. Restarted daily MiraLAX for chronic constipation as well as mouthwash for dry mouth. Supportive counseling. Encourage group attendance.  Alethia Berthold, MD 06/21/2017, 1:51 PM

## 2017-06-21 NOTE — Progress Notes (Signed)
Patient was visible on the unit interacting with peers and staff in [place]. Patient voiced that he's been dealing with depression for a long time and just sometimes he doesn't have the energy or pleasure of doing any activities and sometimes have to make self eat because of lost appetite. Patient denies any SI/HI/AH/VH. Patient is alert and oriented x 4, breathing unlabored, and extremities x 4 within normal limits. Patient is calm and cooperative. Patient did not display any disruptive behavior. Patient continues to be monitored on 15 minute safety checks. Will continue to monitor patient and notify MD of any changes.

## 2017-06-21 NOTE — BHH Group Notes (Signed)
[facility] Group Notes:  (Nursing/MHT/Case Management/Adjunct)  Date:  06/21/2017  Time:  9:05 PM  Type of Therapy:  Group Therapy  Participation Level:  Did Not Attend  Participation Quality: Joel Gross 06/21/2017, 9:05 PM

## 2017-06-22 ENCOUNTER — Inpatient Hospital Stay: Payer: Medicare HMO

## 2017-06-22 LAB — RAPID HIV SCREEN (HIV 1/2 AB+AG)
HIV 1/2 Antibodies: NONREACTIVE
HIV-1 P24 ANTIGEN - HIV24: NONREACTIVE

## 2017-06-22 LAB — BASIC METABOLIC PANEL
Anion gap: 9 (ref 5–15)
BUN: 27 mg/dL — ABNORMAL HIGH (ref 6–20)
CALCIUM: 9.6 mg/dL (ref 8.9–10.3)
CHLORIDE: 103 mmol/L (ref 101–111)
CO2: 29 mmol/L (ref 22–32)
CREATININE: 1.19 mg/dL (ref 0.61–1.24)
GFR calc non Af Amer: >60 mL/min (ref >60)
GLUCOSE: 69 mg/dL (ref 65–99)
Potassium: 4.2 mmol/L (ref 3.5–5.1)
Sodium: 141 mmol/L (ref 135–145)

## 2017-06-22 LAB — AMMONIA: Ammonia: 31 umol/L (ref 9–35)

## 2017-06-22 LAB — VITAMIN B12: Vitamin B-12: 472 pg/mL (ref 180–914)

## 2017-06-22 LAB — GLUCOSE, CAPILLARY: GLUCOSE-CAPILLARY: 98 mg/dL (ref 65–99)

## 2017-06-22 MED ORDER — PREGABALIN 50 MG PO CAPS
200.0000 mg | ORAL_CAPSULE | Freq: Three times a day (TID) | ORAL | Status: DC
  Administered 2017-06-22: 50 mg via ORAL
  Administered 2017-06-23 – 2017-06-26 (×9): 200 mg via ORAL
  Filled 2017-06-22 (×10): qty 4

## 2017-06-22 MED ORDER — AMITRIPTYLINE HCL 50 MG PO TABS
50.0000 mg | ORAL_TABLET | Freq: Every day | ORAL | Status: DC
  Administered 2017-06-22 – 2017-06-25 (×4): 50 mg via ORAL
  Filled 2017-06-22 (×4): qty 1

## 2017-06-22 MED ORDER — IOPAMIDOL (ISOVUE-300) INJECTION 61%
75.0000 mL | Freq: Once | INTRAVENOUS | Status: AC | PRN
  Administered 2017-06-22: 75 mL via INTRAVENOUS

## 2017-06-22 NOTE — BHH Group Notes (Signed)
[facility] Group Notes:  (Nursing/MHT/Case Management/Adjunct)  Date:  06/22/2017  Time:  9:23 PM  Type of Therapy:  Group Therapy  Participation Level:  Active  Participation Quality:  Appropriate  Affect:  Appropriate  Cognitive:  Alert  Insight:  Good  Engagement in Group:  Engaged  Modes of Intervention:  Support  Summary of Progress/Problems:  Joel Gross 06/22/2017, 9:23 PM

## 2017-06-22 NOTE — Progress Notes (Signed)
[facility] MD Progress Note  06/22/2017 12:44 PM Joel Gross  MRN:  [id number] Subjective:  Joel Gross is an 66 y.o.married male, who voluntarily came into [facility].  [place], [place]. Patient reported contacting a mobile crisis line on 06/18/2017 and being referred to [facility] in [place], [place].  Patient stated that after speaking with a representative at [facility], he was referred to come into the ED.  Patient reported having ongoing medical concerns, relating to pain in his leg.  Patient stated that, as a result, he has been experiencing ongoing suicidal ideations, over the past couple of months.  Patient reported having a current suicidal ideation with a plan to overdose with his prescribed medications or shoot himself in the mouth with a gun. Patient reported having access to his medications, however stated that his gun was locked in a safe, by his daughter.  Patient reported experiencing auditory hallucinations consisting of hearing voices and visual hallucinations consisting of seeing shadows and numbers.  Patient reported currently being prescribed medications for his pain and being compliant with daily dosages.  Patient stated experiencing depressive symptoms, such as fatigue, tearfulness, isolation, feelings of worthless, guilt, loss of interest in pleasure activities, and recurrent thoughts about negative personal behaviors from his past.  Patient denies HI or self-injurious behaviors.   8/20 patient says that today he had fleeting thoughts of suicide again. He says that he thinks sometimes about crashing his car against a tree. Patient has been having trouble sleeping. He feels very depressed. He also has significant issues with memory and attention. He says that for the last year his forgetting everything. He is to the point where his wife has to manage all the finances in the house. He is forgetting his family members names, and he is doing very little driving.    Per nursing: Pt is very  pleasant. Denies depression, SI, HI or A/V hallucinations at this time. Pt has appropriate interaction with staff and peers. Pt did go to group. Pt is med compliant, no adverse affects noted. Pt had c/o of insomnia, Trazodone 100 mg tab po given. Reassessed pt, positive effect noted, pt in room with eyes closed resting.  No distress noted. 15 min safety checks continue. Pt slept for 7 hrs  .   Principal Problem: Major depressive disorder, single episode, severe without psychosis ([place]) Diagnosis:   Patient Active Problem List   Diagnosis Date Noted  . Major depressive disorder, single episode, severe without psychosis ([place]) [F32.2] 06/19/2017  . BPH (benign prostatic hyperplasia) [N40.0] 06/19/2017  . Chronic pain syndrome [G89.4] 06/19/2017  . Vitamin B12 deficiency [E53.8] 06/19/2017  . 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 Hypertension   . Neuropathy    History reviewed. No pertinent surgical history.  Family History: History reviewed. No pertinent family history.  Family Psychiatric  History:Denies  Social History:  History  Alcohol Use No     History  Drug Use No    Social History   Social History  . Marital status: Married    Spouse name: N/A  . Number of children: N/A  . Years of education: N/A   Social History Main Topics  . Smoking status: Never Smoker  . Smokeless tobacco: Never Used  . Alcohol  use No  . Drug use: No  . Sexual activity: Not Asked   Other Topics Concern  . None   Social History Narrative  . None     Current Medications: Current Facility-Administered Medications  Medication Dose Route Frequency Provider Last Rate Last Dose  . acetaminophen (TYLENOL) tablet 650 mg   650 mg Oral Q6H PRN Pucilowska, Jolanta B, MD      . alum & mag hydroxide-simeth (MAALOX/MYLANTA) 200-200-20 MG/5ML suspension 30 mL  30 mL Oral Q4H PRN Pucilowska, Jolanta B, MD      . amitriptyline (ELAVIL) tablet 50 mg  50 mg Oral QHS Hernandez-Gonzalez, Seth Bake, MD      . amLODipine (NORVASC) tablet 10 mg  10 mg Oral Daily Pucilowska, Jolanta B, MD   10 mg at 06/22/17 8250  . antiseptic oral rinse (BIOTENE) solution 15 mL  15 mL Mouth Rinse PRN Clapacs, John T, MD      . cyclobenzaprine (FLEXERIL) tablet 10 mg  10 mg Oral QHS Pucilowska, Jolanta B, MD   10 mg at 06/21/17 2109  . DULoxetine (CYMBALTA) DR capsule 60 mg  60 mg Oral Daily Pucilowska, Jolanta B, MD   60 mg at 06/22/17 0811  . HYDROcodone-acetaminophen (NORCO) 7.5-325 MG per tablet 1 tablet  1 tablet Oral TID AC Clapacs, Madie Reno, MD   1 tablet at 06/22/17 1223  . iopamidol (ISOVUE-300) 61 % injection 75 mL  75 mL Intravenous Once PRN Hildred Priest, MD      . lisinopril (PRINIVIL,ZESTRIL) tablet 40 mg  40 mg Oral Daily Pucilowska, Jolanta B, MD   40 mg at 06/22/17 0646  . magnesium hydroxide (MILK OF MAGNESIA) suspension 30 mL  30 mL Oral Daily PRN Pucilowska, Jolanta B, MD      . polyethylene glycol (MIRALAX / GLYCOLAX) packet 17 g  17 g Oral Daily Clapacs, Madie Reno, MD   17 g at 06/22/17 0810  . potassium chloride (KLOR-CON) packet 20 mEq  20 mEq Oral BID Clapacs, Madie Reno, MD   20 mEq at 06/22/17 0813  . pravastatin (PRAVACHOL) tablet 20 mg  20 mg Oral q1800 Pucilowska, Jolanta B, MD   20 mg at 06/21/17 1725  . pregabalin (LYRICA) capsule 200 mg  200 mg Oral TID Hildred Priest, MD        Lab Results: No results found for this or any previous visit (from the past 48 hour(s)).  Blood Alcohol level:  Lab Results  Component Value Date   ETH <5 53/97/6734    Metabolic Disorder Labs: Lab Results  Component Value Date   HGBA1C 5.5 06/20/2017   MPG 111.15 06/20/2017   No results found for: PROLACTIN Lab  Results  Component Value Date   CHOL 140 06/20/2017   TRIG 106 06/20/2017   HDL 29 (L) 06/20/2017   CHOLHDL 4.8 06/20/2017   VLDL 21 06/20/2017   LDLCALC 90 06/20/2017    Physical Findings: AIMS: Facial and Oral Movements Muscles of Facial Expression: None, normal Lips and Perioral Area: None, normal Jaw: None, normal Tongue: None, normal,Extremity Movements Upper (arms, wrists, hands, fingers): None, normal Lower (legs, knees, ankles, toes): None, normal, Trunk Movements Neck, shoulders, hips: None, normal, Overall Severity Severity of abnormal movements (highest score from questions above): None, normal Patient's awareness of abnormal movements (rate only patient's report): No Awareness, Dental Status Current problems with teeth and/or dentures?: No Does patient usually wear dentures?: No  CIWA:  CIWA-Ar Total: 0 COWS:  COWS Total Score: 2  Musculoskeletal: Strength & Muscle Tone: within normal limits Gait & Station: normal Patient leans: N/A  Psychiatric Specialty Exam: Physical Exam  Constitutional: He is oriented to person, place, and time. He appears well-developed and well-nourished.  HENT:  Head: Normocephalic and atraumatic.  Eyes: Conjunctivae and EOM are normal.  Neck: Normal range of motion.  Respiratory: Effort normal.  Musculoskeletal: Normal range of motion.  Neurological: He is alert and oriented to person, place, and time.    Review of Systems  Constitutional: Negative.   HENT: Negative.   Eyes: Negative.   Respiratory: Negative.   Cardiovascular: Negative.   Gastrointestinal: Negative.   Genitourinary: Negative.   Musculoskeletal: Negative.   Skin: Negative.   Neurological: Negative.   Endo/Heme/Allergies: Negative.   Psychiatric/Behavioral: Negative for depression, hallucinations, memory loss, substance abuse and suicidal ideas. The patient is not nervous/anxious and does not have insomnia.     Blood pressure (!) 141/82, pulse 74, temperature  97.8 F (36.6 C), temperature source Oral, resp. rate 18, height 6\' 1"  (1.854 m), weight 99.5 kg (219 lb 6.4 oz), SpO2 98 %.Body mass index is 28.95 kg/m.  General Appearance: Disheveled  Eye Contact:  Good  Speech:  Slow  Volume:  Decreased  Mood:  Dysphoric  Affect:  Blunt  Thought Process:  Linear and Descriptions of Associations: Intact  Orientation:  Full (Time, Place, and Person)  Thought Content:  Hallucinations: None  Suicidal Thoughts:  Yes.  without intent/plan  Homicidal Thoughts:  No  Memory:  Immediate;   Poor Recent;   Poor Remote;   Good  Judgement:  Fair  Insight:  Fair  Psychomotor Activity:  Decreased  Concentration:  Concentration: Poor and Attention Span: Poor  Recall:  Poor  Fund of Knowledge:  Good  Language:  Good  Akathisia:  no  Handed:    AIMS (if indicated):     Assets:  Communication Skills Desire for Improvement Social Support  ADL's:  Intact  Cognition:  Impaired,  Moderate  Sleep:  Number of Hours: 7     Treatment Plan Summary: Daily contact with patient to assess and evaluate symptoms and progress in treatment and Medication management   Major depressive disorder: I made an attempt to call the patient's wife, got her voicemail.  Will need collateral information to understand what is being going on with the patient.  For depression the patient has been started on Cymbalta 60 mg a day. Wellbutrin has been discontinued. He has been taking amitriptyline 75 mg but complains of unsteady gait and dry mouth therefore I will decrease amitriptyline to 50 mg daily at bedtime.  Cognitive problems: I will order HIV, RPR, B12 and a head CT. I will place a psychology consult for cognitive assessment  Chronic pain patient will be continued on: Norco 3 times a day, Lyrica 200 mg 3 times a day and Flexeril 10 mg daily at bedtime.  Hypertension he'll be continued on Norvasc 10 mg a day and lisinopril 40 mg a day. His blood pressure was very elevated this  morning. We will start checking vital signs 3 times a day.  Hypokalemia continue K-Dur 20 milliequivalents twice a day  Dyslipidemia continue Pravachol 20 mg a day  Constipation continue MiraLAX by mouth daily  Dry mouth continue by attending mouthwash   Labs I will recheck his basic metabolic panel as creatinine was elevated prior to admission. Also we'll recheck potassium. We'll be checking B12, HIV ,ammonia and RPR.  Imaging I will check head CT  EKG: Within normal limits   Hildred Priest, MD 06/22/2017, 12:44 PM

## 2017-06-22 NOTE — Consult Note (Signed)
  Psychological Assessment   Name: Joel Gross Age: 66 Date of Evaluation: 06-22-17 Test(s) Administered: Rebeca Alert Gestalt  Trail Making Test Parts A & B  Reason for Referral: [name] was referred for a psychological assessment by his physician, Merlyn Albert, MD.  He was admitted to [facility] for the treatment of suicidal ideation. His mother passed away in 05-23-2015. He reports severe problems with memory and attention over the past year.  Please see the history and physical for additional background information. An assessment of cognitive functioning was requested.  Validity: [name] was pleasant and cooperative with the testing process. He attempted all tasks requested of him. He appeared to try his best.  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 The present evaluation is considered a valid indication of current functioning.  Trail Making Test - [name] completed Part A in 63 seconds. The expected time to completion is 27-39 seconds. [name] performance suggests moderate to severe impairment.  [name] was unable to complete Part B. The expected time to completion is 66-85 seconds. [name] performance suggests moderate to severe impairment.  Jilda Panda - [name] completed the task in less than 15 minutes. He obtained 5 errors: Overlapping Difficulty, Simplification, Collision Tendency, Impotence, and Closure Difficulty. His performance suggests some evidence of brain dysfunction.   Diagnostic Impression: Overall, this evaluation suggests the presence of a cognitive deficit. Given the recent loss of [name] mother, his level of grief and loss may have contributed to the performance seen today. It is also possible that his cognitive processes are deteriorating give difficulties prior to his mother's death. Retesting for the presence of cognitive deficits is recommended after 6 months  to a year if memory, anxiety and concentration difficulties remain an issue.

## 2017-06-22 NOTE — Progress Notes (Signed)
Recreation Therapy Notes  At approximately 2:15 pm, LRT attempted assessment. Patient sleeping.  Leonette Monarch, LRT/CTRS 06/22/2017 3:36 PM

## 2017-06-22 NOTE — [facility] Suicide Risk Assessment (Signed)
[facility] INPATIENT:  Family/Significant Other Suicide Prevention Education  Suicide Prevention Education:  Education Completed; wife, Jerritt Cardoza ph#: [phone] has been identified by the patient as the family member/significant other with whom the patient will be residing, and identified as the person(s) who will aid the patient in the event of a mental health crisis (suicidal ideations/suicide attempt).  With written consent from the patient, the family member/significant other has been provided the following suicide prevention education, prior to the and/or following the discharge of the patient.  The suicide prevention education provided includes the following:  Suicide risk factors  Suicide prevention and interventions  National Suicide Hotline telephone number  [facility] assessment telephone number  [facility] Emergency Assistance [place] and/or Residential Mobile Crisis Unit telephone number  Request made of family/significant other to:  Remove weapons (e.g., guns, rifles, knives), all items previously/currently identified as safety concern.    Remove drugs/medications (over-the-counter, prescriptions, illicit drugs), all items previously/currently identified as a safety concern.  The family member/significant other verbalizes understanding of the suicide prevention education information provided.  The family member/significant other agrees to remove the items of safety concern listed above.  Emilie Rutter, MSW, LCSW-A 06/22/2017, 9:17 AM

## 2017-06-22 NOTE — Progress Notes (Signed)
Pt is alert and oriented x 4. Pt stays he is still feeling a little depressed. Pt states he is ready to be discharged. Pt denies SI, HI or A/V hallucinations. Pt is very pleasant. He has appropriate interaction with staff and peers. Pt did go to group room for a snack. Pt is med compliant. No s/s of distress. 15 min safety checks continue.

## 2017-06-22 NOTE — Progress Notes (Signed)
Recreation Therapy Notes  Date: 08.20.18 Time: 1:00 pm Location: Craft Room  Group Topic: Wellness  Goal Area(s) Addresses:  Patient will identify at least one item per dimension of health. Patient will examine areas they are deficient in.  Behavioral Response: Did not attend  Intervention: 6 Dimensions of Health  Activity: Patients were given a definition sheet defining each dimension of health and a worksheet with each dimension listed. Patients were instructed to write items they were currently doing in each dimension to contribute to their wellness.  Education: LRT educated patients on ways to improve each dimension of health.  Education Outcome: Patient did not attend group.  Clinical Observations/Feedback: Patient did not attend group.  Leonette Monarch, LRT/CTRS 06/22/2017 1:47 PM

## 2017-06-22 NOTE — [facility] Group Notes (Signed)
[facility] LCSW Group Therapy   06/22/2017 9:30 am Type of Therapy: Group Therapy   Participation Level: Active   Participation Quality: Attentive, Sharing and Supportive   Affect: Appropriate   Cognitive: Alert and Oriented   Insight: Developing/Improving and Engaged   Engagement in Therapy: Developing/Improving and Engaged   Modes of Intervention: Clarification, Confrontation, Discussion, Education, Exploration,  Limit-setting, Orientation, Problem-solving, Rapport Building, Art therapist, Socialization and Support   Summary of Progress/Problems: Pt identified obstacles faced currently and processed barriers involved in overcoming these obstacles. Pt identified steps necessary for overcoming these obstacles and explored motivation (internal and external) for facing these difficulties head on. Pt further identified one area of concern in their lives and chose a goal to focus on for today. Patient defined the word obstacle and identified the obstacle that led to this hospitalization. Patient engaged in CBT exercise and identified a change plan in order to overcome obstacles.  Glorious Peach, MSW, LCSW-A 06/22/2017, 2:14PM

## 2017-06-22 NOTE — Plan of Care (Signed)
Problem: Coping: Goal: Ability to verbalize feelings will improve Outcome: Progressing Pt will verbalize his mood and how he's feeling this shift.

## 2017-06-22 NOTE — Tx Team (Signed)
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 acetaminophen (TYLENOL) tablet 650 mg  650 mg Oral Q6H PRN Pucilowska, Jolanta B, MD      . alum & mag hydroxide-simeth (MAALOX/MYLANTA) 200-200-20 MG/5ML suspension 30 mL  30 mL Oral Q4H PRN Pucilowska, Jolanta B, MD      . amitriptyline (ELAVIL) tablet 75 mg  75 mg Oral QHS Clapacs, Madie Reno, MD   75 mg at 06/21/17 2108  . amLODipine (NORVASC) tablet 10 mg  10 mg Oral Daily Pucilowska, Jolanta B, MD   10 mg at 06/22/17 0648  . antiseptic oral rinse (BIOTENE) solution 15 mL  15 mL Mouth Rinse PRN Clapacs, John T, MD      . cyclobenzaprine (FLEXERIL) tablet 10 mg  10 mg Oral QHS Pucilowska, Jolanta B, MD   10 mg at 06/21/17 2109  . DULoxetine (CYMBALTA) DR capsule 60 mg  60 mg Oral Daily Pucilowska, Jolanta B, MD   60 mg at 06/22/17 0811  . HYDROcodone-acetaminophen (NORCO) 7.5-325 MG per tablet 1 tablet  1 tablet Oral TID AC Clapacs, Madie Reno, MD   1 tablet at 06/22/17 [phone]  . lisinopril (PRINIVIL,ZESTRIL) tablet 40 mg  40 mg Oral Daily Pucilowska, Jolanta B, MD   40 mg at 06/22/17 0646  . magnesium hydroxide (MILK OF MAGNESIA) suspension 30 mL  30 mL Oral Daily PRN Pucilowska, Jolanta B, MD      . polyethylene glycol (MIRALAX / GLYCOLAX) packet 17 g  17 g Oral Daily Clapacs, Madie Reno, MD   17 g at 06/22/17 0810  . potassium  chloride (KLOR-CON) packet 20 mEq  20 mEq Oral BID Clapacs, Madie Reno, MD   20 mEq at 06/22/17 0813  . pravastatin (PRAVACHOL) tablet 20 mg  20 mg Oral q1800 Pucilowska, Jolanta B, MD   20 mg at 06/21/17 1725  . pregabalin (LYRICA) capsule 200 mg  200 mg Oral TID Pucilowska, Jolanta B, MD   200 mg at 06/22/17 8466   PTA Medications: Prescriptions Prior to Admission  Medication Sig Dispense Refill Last Dose  . ALPRAZolam (XANAX) 0.25 MG tablet Take 0.25 mg by mouth at bedtime.   06/18/2017 at Unknown time  . amitriptyline (ELAVIL) 25 MG tablet Take 75 mg by mouth at bedtime.   06/18/2017 at Unknown time  . amLODipine (NORVASC) 10 MG tablet Take 10 mg by mouth daily.   06/18/2017 at Unknown time  . B Complex-C (B-COMPLEX WITH VITAMIN C) tablet Take 1 tablet by mouth daily.   06/18/2017 at Unknown time  . buPROPion (WELLBUTRIN XL) 150 MG 24 hr tablet Take 150 mg by mouth at bedtime.   06/18/2017 at Unknown time  . cyclobenzaprine (FLEXERIL) 10 MG tablet Take 10 mg by mouth at bedtime.   06/18/2017 at Unknown time  . HYDROcodone-acetaminophen (NORCO) 7.5-325 MG tablet Take 1 tablet by mouth 3 (three) times daily.   06/18/2017 at Unknown time  . hydroxypropyl methylcellulose / hypromellose (ISOPTO TEARS /  GONIOVISC) 2.5 % ophthalmic solution Place 2 drops into both eyes 3 (three) times daily as needed for dry eyes.   unk  . lisinopril (PRINIVIL,ZESTRIL) 40 MG tablet Take 40 mg by mouth daily.   06/17/2017 at Unknown time  . Multiple Vitamin (MULTIVITAMIN WITH MINERALS) TABS tablet Take 1 tablet by mouth daily.   06/18/2017 at Unknown time  . potassium chloride SA (K-DUR,KLOR-CON) 20 MEQ tablet Take 20 mEq by mouth 2 (two) times daily.   06/18/2017 at am  . pravastatin (PRAVACHOL) 20 MG tablet Take 20 mg by mouth daily.   06/18/2017 at Unknown time  . pregabalin (LYRICA) 100 MG capsule Take 100 mg by mouth 3 (three) times daily.   06/18/2017 at Unknown time    Patient Stressors: Health problems Marital or family  conflict  Patient Strengths: Ability for insight Capable of independent living Motivation for treatment/growth Supportive family/friends  Treatment Modalities: Medication Management, Group therapy, Case management,  1 to 1 session with clinician, Psychoeducation, Recreational therapy.   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[place]) Long Term Goal(s): Improvement in symptoms so as ready for discharge show good insight into pain management and stressors and agreed to long-term treatment Improvement in symptoms so as ready for discharge   Short Term Goals: Ability to verbalize feelings will improve Ability to disclose and discuss suicidal ideas Ability to demonstrate self-control will improve Ability to identify and develop effective coping behaviors will improve Ability to maintain clinical measurements within normal limits will improve  Medication Management: Evaluate patient's response, side effects, and tolerance of medication regimen.  Therapeutic Interventions: 1 to 1 sessions, Unit Group sessions and Medication administration.  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[place]) Active Problems:   Suicidal ideation   BPH (benign prostatic hyperplasia)   Chronic pain syndrome   Vitamin B12 deficiency   Major depressive disorder, single episode, severe with psychotic features ([place])  Long Term Goal(s): Improvement in symptoms so as ready for discharge show good insight into pain management and stressors and agreed to long-term treatment Improvement in symptoms so as ready for discharge   Short Term Goals: Ability to verbalize feelings will improve Ability to disclose and discuss suicidal ideas Ability to demonstrate self-control will improve Ability to identify and  develop effective coping behaviors will improve Ability to maintain clinical measurements within normal limits will improve     Medication Management: Evaluate patient's response, side effects, and tolerance of medication regimen.  Therapeutic Interventions: 1 to 1 sessions, Unit Group sessions and Medication administration.  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 RN will report any adverse and/or side effects to prescribing provider.  Therapeutic Interventions: 1 on 1 counseling sessions, Psychoeducation, Medication administration, Evaluate responses to treatment, Monitor vital signs and CBGs as ordered, Perform/monitor CIWA, COWS, AIMS and Fall Risk screenings as ordered, Perform wound care treatments as ordered.  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  Short Term Goals: Engage patient in aftercare planning with referrals and resources, Increase social support and Increase emotional regulation  Therapeutic Interventions: Assess for all discharge needs, 1 to 1 time with Social worker, Explore available resources and support systems, Assess for adequacy in community support network,  Educate family and significant other(s) on suicide prevention, Complete Psychosocial Assessment, Interpersonal group therapy.  Evaluation of Outcomes: Progressing   Progress in Treatment: Attending groups: Yes. Participating in groups: Yes. Taking medication as prescribed: Yes. Toleration medication: Yes. Family/Significant other contact made: Yes, individual(s) contacted:  CSW spoke with patient's wife. Patient understands diagnosis: Yes. Discussing patient identified problems/goals with staff: Yes. Medical problems stabilized or resolved: Yes. Denies suicidal/homicidal ideation: Yes. Issues/concerns per patient self-inventory: No.  New problem(s) identified: None identified.  New Short Term/Long Term Goal(s): Patient stated that his goal is to stabilize on medications and "get back to himself."  Discharge Plan or Barriers: Patient will discharge home with wife and follow-up with [facility].   Reason for Continuation of Hospitalization: Depression Suicidal ideation  Estimated Length of Stay: 3-5 days   Attendees: Patient: Glenwood Revoir 06/22/2017 11:30 AM  Physician: Dr. Merlyn Albert, MD  06/22/2017 11:30 AM  Nursing:  06/22/2017 11:30 AM  RN Care Manager: 06/22/2017 11:30 AM  Social Worker: Glorious Peach, MSW, LCSW-A 06/22/2017 11:30 AM  Recreational Therapist: Drue Flirt, LRT, [place]  06/22/2017 11:30 AM  Other:  06/22/2017 11:30 AM  Other:  06/22/2017 11:30 AM  Other: 06/22/2017 11:30 AM    Scribe for Treatment Team: Emilie Rutter, [place] 06/22/2017 11:30 AM

## 2017-06-22 NOTE — Progress Notes (Signed)
Pt denies HI, endorses passive SI, but reports he tries to make himself stop thinking about it. Pt verbally contracts for safety. Pt also endorses visual hallucinations periodically.  Encouragement and support offered. Medications given as prescribed. Safety checks maintained. Pt receptive and remains safe on unit with q 15 min checks.

## 2017-06-22 NOTE — Plan of Care (Signed)
Problem: Health Behavior/Discharge Planning: Goal: Compliance with therapeutic regimen will improve Outcome: Progressing Patient medication and group compliant

## 2017-06-23 DIAGNOSIS — R4189 Other symptoms and signs involving cognitive functions and awareness: Secondary | ICD-10-CM

## 2017-06-23 DIAGNOSIS — F333 Major depressive disorder, recurrent, severe with psychotic symptoms: Secondary | ICD-10-CM

## 2017-06-23 DIAGNOSIS — F411 Generalized anxiety disorder: Secondary | ICD-10-CM

## 2017-06-23 DIAGNOSIS — E785 Hyperlipidemia, unspecified: Secondary | ICD-10-CM

## 2017-06-23 DIAGNOSIS — I10 Essential (primary) hypertension: Secondary | ICD-10-CM

## 2017-06-23 DIAGNOSIS — R29818 Other symptoms and signs involving the nervous system: Secondary | ICD-10-CM

## 2017-06-23 HISTORY — DX: Hyperlipidemia, unspecified: E78.5

## 2017-06-23 HISTORY — DX: Major depressive disorder, recurrent, severe with psychotic symptoms: F33.3

## 2017-06-23 HISTORY — DX: Generalized anxiety disorder: F41.1

## 2017-06-23 HISTORY — DX: Essential (primary) hypertension: I10

## 2017-06-23 LAB — RPR: RPR: NONREACTIVE

## 2017-06-23 LAB — URINALYSIS, COMPLETE (UACMP) WITH MICROSCOPIC
Bacteria, UA: NONE SEEN
Bilirubin Urine: NEGATIVE
GLUCOSE, UA: NEGATIVE mg/dL
HGB URINE DIPSTICK: NEGATIVE
Ketones, ur: NEGATIVE mg/dL
LEUKOCYTES UA: NEGATIVE
Nitrite: NEGATIVE
PH: 7 (ref 5.0–8.0)
Protein, ur: NEGATIVE mg/dL
RBC / HPF: NONE SEEN RBC/hpf (ref 0–5)
Specific Gravity, Urine: 1.013 (ref 1.005–1.030)

## 2017-06-23 LAB — GLUCOSE, CAPILLARY: Glucose-Capillary: 92 mg/dL (ref 65–99)

## 2017-06-23 MED ORDER — CLONAZEPAM 0.5 MG PO TABS
0.5000 mg | ORAL_TABLET | Freq: Every day | ORAL | Status: DC
  Administered 2017-06-23 – 2017-06-24 (×2): 0.5 mg via ORAL
  Filled 2017-06-23 (×2): qty 1

## 2017-06-23 MED ORDER — HYDROCHLOROTHIAZIDE 12.5 MG PO CAPS
12.5000 mg | ORAL_CAPSULE | Freq: Every day | ORAL | Status: DC
  Administered 2017-06-23 – 2017-06-24 (×2): 12.5 mg via ORAL
  Filled 2017-06-23: qty 1

## 2017-06-23 MED ORDER — RISPERIDONE 0.5 MG PO TABS
1.0000 mg | ORAL_TABLET | Freq: Every day | ORAL | Status: DC
  Administered 2017-06-23 – 2017-06-25 (×3): 1 mg via ORAL
  Filled 2017-06-23 (×2): qty 2
  Filled 2017-06-23 (×2): qty 1

## 2017-06-23 MED ORDER — CLONAZEPAM 1 MG PO TABS
1.0000 mg | ORAL_TABLET | Freq: Every day | ORAL | Status: DC
  Administered 2017-06-23: 1 mg via ORAL
  Filled 2017-06-23: qty 1

## 2017-06-23 MED ORDER — ONDANSETRON 4 MG PO TBDP: 4.0000 mg | ORAL_TABLET | Freq: Three times a day (TID) | ORAL | Status: DC | PRN

## 2017-06-23 MED ORDER — DULOXETINE HCL 20 MG PO CPEP
40.0000 mg | ORAL_CAPSULE | Freq: Every day | ORAL | Status: DC
  Administered 2017-06-24 – 2017-06-26 (×3): 40 mg via ORAL
  Filled 2017-06-23 (×3): qty 2

## 2017-06-23 NOTE — Plan of Care (Signed)
Problem: Activity: Goal: Interest or engagement in activities will improve Outcome: Progressing Attending unit  Programing  Goal: Sleeping patterns will improve Outcome: Not Progressing Having difficulity  With sleep  Problem: Safety: Goal: Periods of time without injury will increase Outcome: Progressing No injuries this admissions   Problem: [facility] Participation in Recreation Therapeutic Interventions Goal: STG-Patient will demonstrate improved self esteem by identif STG: Self-Esteem - Within 4 treatment sessions, patient will verbalize at least 5 positive affirmation statements in each of 2 treatment sessions to increase self-esteem.  Outcome: Progressing Working on self esteem issues

## 2017-06-23 NOTE — [facility] Group Notes (Signed)
Goals Group  Date/Time: 06/23/2017, 9:00 AM Type of Therapy and Topic: Group Therapy: Goals Group: SMART Goals  ?  Participation Level: Moderate  ?  Description of Group:  ?  The purpose of a daily goals group is to assist and guide patients in setting recovery/wellness-related goals. The objective is to set goals as they relate to the crisis in which they were admitted. Patients will be using SMART goal modalities to set measurable goals. Characteristics of realistic goals will be discussed and patients will be assisted in setting and processing how one will reach their goal. Facilitator will also assist patients in applying interventions and coping skills learned in psycho-education groups to the SMART goal and process how one will achieve defined goal.  ?  Therapeutic Goals:  ?  -Patients will develop and document one goal related to or their crisis in which brought them into treatment.  -Patients will be guided by LCSW using SMART goal setting modality in how to set a measurable, attainable, realistic and time sensitive goal.  -Patients will process barriers in reaching goal.  -Patients will process interventions in how to overcome and successful in reaching goal.  ?  Patient's Goal: Patient stated that his goal is to cope better with anxiety. In order to accomplish this goal he stated that he will go to groups and interact with others.  ?  Therapeutic Modalities:  Motivational Interviewing  Cognitive Behavioral Therapy  Crisis Intervention Model  SMART goals setting  Glorious Peach, MSW, LCSW-A 06/23/2017, 9:44AM

## 2017-06-23 NOTE — Progress Notes (Signed)
Spoke with the patient's wife today. She reports that he has always have depression however this year due to depression seems to have worsened especially after the death of his mother, however he was struggling with mood even before she passed away.  Wife reports that he does not want to engage in any special activities like going out on vacation or even going out to eat. He is overly concerned with their finances thinking that they have no money. He also has been talking that the family is going to place him in an assisted living facility. He has been thinking that the wife is planning to leave him.  Wife says that for the last 5 years she has not is that he will incorporate all the people stories into his life. For example he had talked about having an argument with his wife about something that'll never happen or he was talking about him having a lemon tree in their property, which he never had and  the wife knows for a fact that what he was talking is something they heard from one of their friends--this could be confabulation.  He told his wife yesterday that he was having very dark and gross thoughts.  Based on this information I will change the patient's diagnosis to major depressive disorder with psychotic features. I also will add the diagnosis of generalized anxiety disorder as the wife describes a pattern of excessive worrying--- almost obsessive.  Patient will be started on Risperdal 1 mg by mouth daily at bedtime tonight

## 2017-06-23 NOTE — Progress Notes (Signed)
[facility] MD Progress Note  06/23/2017 9:19 AM PRATHIK AMAN  MRN:  [id number] Subjective:  Joel Gross is an 66 y.o.married male, who voluntarily came into [facility].  [place], [place]. Patient reported contacting a mobile crisis line on 06/18/2017 and being referred to [facility] in [place], [place].  Patient stated that after speaking with a representative at [facility], he was referred to come into the ED.  Patient reported having ongoing medical concerns, relating to pain in his leg.  Patient stated that, as a result, he has been experiencing ongoing suicidal ideations, over the past couple of months.  Patient reported having a current suicidal ideation with a plan to overdose with his prescribed medications or shoot himself in the mouth with a gun. Patient reported having access to his medications, however stated that his gun was locked in a safe, by his daughter.  Patient reported experiencing auditory hallucinations consisting of hearing voices and visual hallucinations consisting of seeing shadows and numbers.  Patient reported currently being prescribed medications for his pain and being compliant with daily dosages.  Patient stated experiencing depressive symptoms, such as fatigue, tearfulness, isolation, feelings of worthless, guilt, loss of interest in pleasure activities, and recurrent thoughts about negative personal behaviors from his past.  Patient denies HI or self-injurious behaviors.   8/20 patient says that today he had fleeting thoughts of suicide again. He says that he thinks sometimes about crashing his car against a tree. Patient has been having trouble sleeping. He feels very depressed. He also has significant issues with memory and attention. He says that for the last year his forgetting everything. He is to the point where his wife has to manage all the finances in the house. He is forgetting his family members names, and he is doing very little driving.  6/71 patient reports feeling  very nervous and find himself trembling at times. He did not sleep at all last night. He says that his mood continues to be hopeless and depressed. He is still having suicidal thoughts but they are not as intense as they had been prior to admission. He says that he has multiple relatives who have committed suicide. His uncle and 2 cousins committed suicide.  He feels very guilty about not having energy or desire of doing things with his family. He feels that maybe his wife will be better without him finding a new husband.  Per nursing: Pt is alert and oriented x 4. Pt stays he is still feeling a little depressed. Pt states he is ready to be discharged. Pt denies SI, HI or A/V hallucinations. Pt is very pleasant. He has appropriate interaction with staff and peers. Pt did go to group room for a snack. Pt is med compliant. No s/s of distress. 15 min safety checks continue.  Principal Problem: Major depressive disorder, single episode, severe without psychosis ([place]) Diagnosis:   Patient Active Problem List   Diagnosis Date Noted  . Neurocognitive deficits [R29.818, R41.89] 06/23/2017  . HTN (hypertension) [I10] 06/23/2017  . Hyperlipidemia [E78.5] 06/23/2017  . Major depressive disorder, single episode, severe without psychosis ([place]) [F32.2] 06/19/2017  . BPH (benign prostatic hyperplasia) [N40.0] 06/19/2017  . Chronic pain syndrome [G89.4] 06/19/2017  . 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  Hypertension   . Neuropathy    History reviewed. No pertinent surgical history.  Family History: History reviewed. No pertinent family history.  Family Psychiatric  History:Denies  Social History:  History  Alcohol Use No      History  Drug Use No    Social History   Social History  . Marital status: Married    Spouse name: N/A  . Number of children: N/A  . Years of education: N/A   Social History Main Topics  . Smoking status: Never Smoker  . Smokeless tobacco: Never Used  . Alcohol use No  . Drug use: No  . Sexual activity: Not Asked   Other Topics Concern  . None   Social History Narrative  . None     Current Medications: Current Facility-Administered Medications  Medication Dose Route Frequency Provider Last Rate Last Dose  . acetaminophen (TYLENOL) tablet 650 mg  650 mg Oral Q6H PRN Pucilowska, Jolanta B, MD      . alum & mag hydroxide-simeth (MAALOX/MYLANTA) 200-200-20 MG/5ML suspension 30 mL  30 mL Oral Q4H PRN Pucilowska, Jolanta B, MD      . amitriptyline (ELAVIL) tablet 50 mg  50 mg Oral QHS Hildred Priest, MD   50 mg at 06/22/17 2100  . amLODipine (NORVASC) tablet 10 mg  10 mg Oral Daily Pucilowska, Jolanta B, MD   10 mg at 06/23/17 0823  . antiseptic oral rinse (BIOTENE) solution 15 mL  15 mL Mouth Rinse PRN Clapacs, John T, MD      . cyclobenzaprine (FLEXERIL) tablet 10 mg  10 mg Oral QHS Pucilowska, Jolanta B, MD   10 mg at 06/22/17 2101  . DULoxetine (CYMBALTA) DR capsule 60 mg  60 mg Oral Daily Pucilowska, Jolanta B, MD   60 mg at 06/23/17 4081  . hydrochlorothiazide (MICROZIDE) capsule 12.5 mg  12.5 mg Oral Daily Hildred Priest, MD      . HYDROcodone-acetaminophen (NORCO) 7.5-325 MG per tablet 1 tablet  1 tablet Oral TID AC Clapacs, Madie Reno, MD   1 tablet at 06/23/17 [id number]  . lisinopril (PRINIVIL,ZESTRIL) tablet 40 mg  40 mg Oral Daily Pucilowska, Jolanta B, MD   40 mg at 06/23/17 0823  . magnesium hydroxide (MILK OF MAGNESIA) suspension 30 mL  30 mL Oral Daily PRN Pucilowska, Jolanta B, MD      . polyethylene glycol (MIRALAX / GLYCOLAX) packet 17 g  17 g Oral Daily Clapacs, Madie Reno, MD   17 g at 06/23/17 8563  . potassium chloride (KLOR-CON) packet 20 mEq   20 mEq Oral BID Clapacs, Madie Reno, MD   20 mEq at 06/23/17 0827  . pravastatin (PRAVACHOL) tablet 20 mg  20 mg Oral q1800 Pucilowska, Jolanta B, MD   20 mg at 06/22/17 1702  . pregabalin (LYRICA) capsule 200 mg  200 mg Oral TID Hildred Priest, MD   200 mg at 06/23/17 1497    Lab Results:  Results for orders placed or performed during the hospital encounter of 06/19/17 (from the past 48 hour(s))  Vitamin B12     Status: None   Collection Time: 06/22/17  2:52 PM  Result Value Ref Range   Vitamin B-12 472 180 - 914 pg/mL    Comment: (NOTE) This assay is not validated for testing neonatal or myeloproliferative syndrome specimens for Vitamin B12 levels. Performed at [facility], [place] [address] [place], [place] [address]   Rapid HIV screen (HIV 1/2 Ab+Ag)     Status:  None   Collection Time: 06/22/17  2:52 PM  Result Value Ref Range   HIV-1 P24 Antigen - HIV24 NON REACTIVE NON REACTIVE   HIV 1/2 Antibodies NON REACTIVE NON REACTIVE   Interpretation (HIV Ag Ab)      A non reactive test result means that HIV 1 or HIV 2 antibodies and HIV 1 p24 antigen were not detected in the specimen.  RPR     Status: None   Collection Time: 06/22/17  2:52 PM  Result Value Ref Range   RPR Ser Ql Non Reactive Non Reactive    Comment: (NOTE) Performed At: [facility] [place], [place] [id number] Lindon Romp MD DZ:[id number]   Basic metabolic panel     Status: Abnormal   Collection Time: 06/22/17  2:52 PM  Result Value Ref Range   Sodium 141 135 - 145 mmol/L   Potassium 4.2 3.5 - 5.1 mmol/L   Chloride 103 101 - 111 mmol/L   CO2 29 22 - 32 mmol/L   Glucose, Bld 69 65 - 99 mg/dL   BUN 27 (H) 6 - 20 mg/dL   Creatinine, Ser 1.19 0.61 - 1.24 mg/dL   Calcium 9.6 8.9 - 10.3 mg/dL   GFR calc non Af Amer >60 >60 mL/min   GFR calc Af Amer >60 >60 mL/min    Comment: (NOTE) The eGFR has been calculated using the CKD EPI equation. This calculation has not been  validated in all clinical situations. eGFR's persistently <60 mL/min signify possible Chronic Kidney Disease.    Anion gap 9 5 - 15  Ammonia     Status: None   Collection Time: 06/22/17  2:52 PM  Result Value Ref Range   Ammonia 31 9 - 35 umol/L  Glucose, capillary     Status: None   Collection Time: 06/22/17  8:50 PM  Result Value Ref Range   Glucose-Capillary 98 65 - 99 mg/dL  Glucose, capillary     Status: None   Collection Time: 06/23/17  7:02 AM  Result Value Ref Range   Glucose-Capillary 92 65 - 99 mg/dL    Blood Alcohol level:  Lab Results  Component Value Date   ETH <5 41/96/2229    Metabolic Disorder Labs: Lab Results  Component Value Date   HGBA1C 5.5 06/20/2017   MPG 111.15 06/20/2017   No results found for: PROLACTIN Lab Results  Component Value Date   CHOL 140 06/20/2017   TRIG 106 06/20/2017   HDL 29 (L) 06/20/2017   CHOLHDL 4.8 06/20/2017   VLDL 21 06/20/2017   LDLCALC 90 06/20/2017    Physical Findings: AIMS: Facial and Oral Movements Muscles of Facial Expression: None, normal Lips and Perioral Area: None, normal Jaw: None, normal Tongue: None, normal,Extremity Movements Upper (arms, wrists, hands, fingers): None, normal Lower (legs, knees, ankles, toes): None, normal, Trunk Movements Neck, shoulders, hips: None, normal, Overall Severity Severity of abnormal movements (highest score from questions above): None, normal Patient's awareness of abnormal movements (rate only patient's report): No Awareness, Dental Status Current problems with teeth and/or dentures?: No Does patient usually wear dentures?: No  CIWA:  CIWA-Ar Total: 0 COWS:  COWS Total Score: 2  Musculoskeletal: Strength & Muscle Tone: within normal limits Gait & Station: normal Patient leans: N/A  Psychiatric Specialty Exam: Physical Exam  Constitutional: He is oriented to person, place, and time. He appears well-developed and well-nourished.  HENT:  Head: Normocephalic and  atraumatic.  Eyes: Conjunctivae and EOM are normal.  Neck: Normal range of motion.  Respiratory: Effort normal.  Musculoskeletal: Normal range of motion.  Neurological: He is alert and oriented to person, place, and time.    Review of Systems  Constitutional: Negative.   HENT: Negative.   Eyes: Negative.   Respiratory: Negative.   Cardiovascular: Negative.   Gastrointestinal: Negative.   Genitourinary: Negative.   Musculoskeletal: Positive for joint pain.  Skin: Negative.   Neurological: Negative.   Endo/Heme/Allergies: Negative.   Psychiatric/Behavioral: Positive for depression and memory loss. Negative for hallucinations, substance abuse and suicidal ideas. The patient has insomnia. The patient is not nervous/anxious.    Blood pressure (!) 136/91, pulse 64, temperature 97.7 F (36.5 C), temperature source Oral, resp. rate 18, height $RemoveBe'6\' 1"'maQlBmmeX$  (1.854 m), weight 99.5 kg (219 lb 6.4 oz), SpO2 98 %.Body mass index is 28.95 kg/m.  General Appearance: Disheveled  Eye Contact:  Good  Speech:  Slow  Volume:  Decreased  Mood:  Dysphoric  Affect:  Blunt  Thought Process:  Linear and Descriptions of Associations: Intact  Orientation:  Full (Time, Place, and Person)  Thought Content:  Hallucinations: None  Suicidal Thoughts:  Yes.  without intent/plan  Homicidal Thoughts:  No  Memory:  Immediate;   Poor Recent;   Poor Remote;   Good  Judgement:  Fair  Insight:  Fair  Psychomotor Activity:  Decreased  Concentration:  Concentration: Poor and Attention Span: Poor  Recall:  Poor  Fund of Knowledge:  Good  Language:  Good  Akathisia:  no  Handed:    AIMS (if indicated):     Assets:  Communication Skills Desire for Improvement Social Support  ADL's:  Intact  Cognition:  Impaired,  Moderate  Sleep:  Number of Hours: 7.5     Treatment Plan Summary: Daily contact with patient to assess and evaluate symptoms and progress in treatment and Medication management   Major depressive  disorder: Continue Cymbalta but since he has been complaining of nausea I will decrease the dose to 40 mg a day. For insomnia, chronic pain he will be continued on amitriptyline 50 mg a day. He feels that his gait is less unsteady.  Anxiety/insomnia: For right now I will order clonazepam 0.5 mg by mouth in the morning and then 1 mg by mouth daily at bedtime.  Cognitive problems: psychological assessment completed yesterday "Overall, this evaluation suggests the presence of a cognitive deficit. Given the recent loss of [name] mother, his level of grief and loss may have contributed to the performance seen today. It is also possible that his cognitive processes are deteriorating give difficulties prior to his mother's death. Retesting for the presence of cognitive deficits is recommended after 6 months to a year if memory, anxiety and concentration difficulties remain an issue."  Chronic pain patient will be continued on: Norco 3 times a day, Lyrica 200 mg 3 times a day and Flexeril 10 mg daily at bedtime.  Hypertension he'll be continued on Norvasc 10 mg a day and lisinopril 40 mg a day. I will add HCTZ 12.5 mg as BP still high  Hypokalemia continue K-Dur 20 milliequivalents twice a day---K is wnl  Dyslipidemia continue Pravachol 20 mg a day  Constipation continue MiraLAX by mouth daily  Dry mouth continue by attending mouthwash   Labs: BMP wnl, ammonia, TSH, B12, HIV, RPR neg/WNL  Head CT: Moderate chronic microvascular ischemic type changes in the cerebral white matter.  EKG: Within normal limits  We will attempt to contact patient's wife  again today   Hildred Priest, MD 06/23/2017, 9:19 AM

## 2017-06-23 NOTE — Progress Notes (Signed)
Recreation Therapy Notes  Date: 08.21.18 Time: 3:00 pm Location: Craft Room  Group Topic: Self-expression  Goal Area(s) Addresses:  Patient will be able to identify one color that represents each emotion. Patient will verbalize benefit of using art as a means of self-expression. Patient will verbalize one emotion experienced while participating in activity.  Behavioral Response: Attentive, Interactive  Intervention: The Colors Within Me  Activity: Patients were given blank face worksheets and were instructed to pick a color for each emotion they were feeling and show on the worksheet how much of that emotion they were feeling.  Education: LRT educated patients on other forms of self-expression.  Education Outcome: Acknowledges education/In group clarification offered  Clinical Observations/Feedback: Patient picked a color for each emotion he was feeling and showed on the worksheet how much of that emotion he was feeling. Patient contributed to group discussion by stating how his emotion affect his treatment in the hospital, that his emotions are dynamic, how he sees his emotions changing once he has been d/c, that it was helpful to see his emotions on paper and why, and what steps he can take to change his emotions.  Leonette Monarch, LRT/CTRS 06/23/2017 3:51 PM

## 2017-06-23 NOTE — Plan of Care (Signed)
Problem: Activity: Goal: Interest or engagement in leisure activities will improve Outcome: Progressing Interacting with unit programing  Goal: Imbalance in normal sleep/wake cycle will improve Outcome: Not Progressing Voice of poor sleep , Informed MD  Problem: Education: Goal: Utilization of techniques to improve thought processes will improve Outcome: Progressing Attending unit programing  Goal: Knowledge of the prescribed therapeutic regimen will improve Verbalize understanding of medication given  Problem: Coping: Goal: Ability to cope will improve Outcome: Progressing Instructing on coping skills  Goal: Ability to verbalize feelings will improve Attending group therapy  Verbalizing  Feelings   Problem: Health Behavior/Discharge Planning: Goal: Ability to make decisions will improve Outcome: Progressing Continue to work  On coping skills  Goal: Compliance with therapeutic regimen will improve Outcome: Progressing Working with unit programing   Problem: Role Relationship: Goal: Ability to demonstrate positive changes in social behaviors and relationships will improve Outcome: Progressing Working  On coping skills   Problem: Safety: Goal: Ability to disclose and discuss suicidal ideas will improve Outcome: Progressing Denies  Suicidal  Ideations .  Goal: Ability to identify and utilize support systems that promote safety will improve Outcome: Progressing Science writer staff and family   Problem: Self-Concept: Goal: Ability to verbalize positive feelings about self will improve Outcome: Progressing Verbalize understanding  Of positive feeling  Goal: Level of anxiety will decrease Outcome: Progressing No anxiety  Voice today able to alert staff  Problem: Education: Goal: Ability to make informed decisions regarding treatment will improve Outcome: Progressing Attending unit programing   Problem: Coping: Goal: Ability to cope will improve Outcome:  Progressing Working on coping skills   Problem: Medication: Goal: Compliance with prescribed medication regimen will improve Outcome: Progressing Able  To verbalize medication  Prescribed   Problem: Self-Concept: Goal: Ability to disclose and discuss suicidal ideas will improve Outcome: Progressing Denies  Suicidal ideations
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
## 2017-06-23 NOTE — Progress Notes (Signed)
D: Patient stated slept bad last night .Stated appetite fair and energy level  low. 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 Stated on Depression scale ,8 hopeless 5 and anxiety 8 .( low 0-10 high) Denies suicidal  homicidal ideations  .  No auditory hallucinations  No pain concerns . Appropriate ADL'S. Interacting with peers and staff. Working on coping  Concerns  Voice of having racing thoughts  Urine sent to lab.  A: Encourage patient participation with unit programming . Instruction  Given on  Medication , verbalize understanding. R: Voice no other concerns. Staff continue to monitor

## 2017-06-24 MED ORDER — HYDROCHLOROTHIAZIDE 12.5 MG PO CAPS
12.5000 mg | ORAL_CAPSULE | Freq: Once | ORAL | Status: AC
  Administered 2017-06-24: 12.5 mg via ORAL
  Filled 2017-06-24: qty 1

## 2017-06-24 MED ORDER — HYDROCHLOROTHIAZIDE 12.5 MG PO CAPS: 12.5000 mg | ORAL_CAPSULE | Freq: Once | ORAL | Status: DC

## 2017-06-24 MED ORDER — HYDROCHLOROTHIAZIDE 25 MG PO TABS
25.0000 mg | ORAL_TABLET | Freq: Every day | ORAL | Status: DC
  Administered 2017-06-25 – 2017-06-26 (×2): 25 mg via ORAL
  Filled 2017-06-24 (×2): qty 1

## 2017-06-24 MED ORDER — CLONAZEPAM 0.5 MG PO TABS
0.5000 mg | ORAL_TABLET | Freq: Every day | ORAL | Status: DC
  Administered 2017-06-24 – 2017-06-25 (×2): 0.5 mg via ORAL
  Filled 2017-06-24 (×2): qty 1

## 2017-06-24 NOTE — Plan of Care (Signed)
Problem: Education: Goal: Ability to make informed decisions regarding treatment will improve Outcome: Progressing Patient has the ability to make decisions regarding treatment  Problem: Coping: Goal: Ability to cope will improve Outcome: Progressing Patient's ability to cope is improving   Problem: Medication: Goal: Compliance with prescribed medication regimen will improve Outcome: Progressing Patient Is compliant with medication regimen  Problem: Activity: Goal: Interest or engagement in activities will improve Outcome: Progressing Patient attends group and engages in sessions

## 2017-06-24 NOTE — Progress Notes (Signed)
Recreation Therapy Notes  Date: 08.22.18 Time: 1:00 pm Location: Craft Room  Group Topic: Self-esteem  Goal Area(s) Addresses:  Patient will write at least one positive trait about self. Patient will verbalize benefit of having a healthy self-esteem.  Behavioral Response: Attentive, Interactive  Intervention: I Am  Activity: Patients were given worksheets with the letter I on them and were instructed to write as many positive traits about themselves inside the letter.  Education: LRT educated patients on ways to increase their self-esteem.  Education Outcome: Acknowledges education/In group clarification offered   Clinical Observations/Feedback: Patient wrote positive traits. Patient contributed to group discussion by stating what makes it difficult to think of positive traits at times, and how he can increase his self-esteem.  Leonette Monarch, LRT/CTRS 06/24/2017 1:51 PM

## 2017-06-24 NOTE — [facility] Group Notes (Signed)
[place] LCSW Group Therapy   06/24/2017  9:30 am   Type of Therapy: Group Therapy   Participation Level: Active   Participation Quality: Attentive, Sharing and Supportive   Affect: Appropriate   Cognitive: Alert and Oriented   Insight: Developing/Improving and Engaged   Engagement in Therapy: Developing/Improving and Engaged   Modes of Intervention: Clarification, Confrontation, Discussion, Education, Exploration, Limit-setting, Orientation, Problem-solving, Rapport Building, Art therapist, Socialization and Support   Summary of Progress/Problems: The topic for group today was emotional regulation. This group focused on both positive and negative emotion identification and allowed  group members to process ways to identify feelings, regulate negative emotions, and find healthy ways to manage internal/external emotions. Group members were asked to reflect on a time when their reaction to an emotion led to a negative outcome and explored how alternative responses using emotion regulation would have benefited them. Group members were also asked to discuss a time when emotion regulation was utilized when a negative emotion was experienced. Patient defined emotion regulation and identified two emotions he experienced before being hospitalized. Patient identified ways to regulate those negative emotions. CSW provided support to patient.    Glorious Peach, MSW, LCSWA 06/24/2017, 10:02AM

## 2017-06-24 NOTE — Progress Notes (Signed)
Patient is alert and oriented to person, place and time. Skin is warm, dry and intact. No limitations to all four extremities noted. Patient denies SI/HI, AVH at this time. States, "I am very drowsy this morning, I took that Ambien last night and it got me feeling cloudy headed still." Patient encouraged to rest. BP elevated this morning, rechecked with decreased results noted. Patient was observed ambulating in hall during the shift with a steady gait. Attends meals and group with peer interaction noted. Milieu remains therapeutic. Patient will be monitored and physician notified of any acute changes.

## 2017-06-24 NOTE — Plan of Care (Signed)
Problem: Activity: Goal: Sleeping patterns will improve Outcome: Progressing Patient slept for Estimated Hours of 7.15; Precautionary checks every 15 minutes for safety maintained, room free of safety hazards, patient sustains no injury or falls during this shift.    

## 2017-06-24 NOTE — Progress Notes (Signed)
[facility] MD Progress Note  06/24/2017 9:56 AM Joel Gross  MRN:  [id number] Subjective:  Joel Gross is an 66 y.o.married male, who voluntarily came into [facility].  [place], [place]. Patient reported contacting a mobile crisis line on 06/18/2017 and being referred to [facility] in [place], [place].  Patient stated that after speaking with a representative at [facility], he was referred to come into the ED.  Patient reported having ongoing medical concerns, relating to pain in his leg.  Patient stated that, as a result, he has been experiencing ongoing suicidal ideations, over the past couple of months.  Patient reported having a current suicidal ideation with a plan to overdose with his prescribed medications or shoot himself in the mouth with a gun. Patient reported having access to his medications, however stated that his gun was locked in a safe, by his daughter.  Patient reported experiencing auditory hallucinations consisting of hearing voices and visual hallucinations consisting of seeing shadows and numbers.  Patient reported currently being prescribed medications for his pain and being compliant with daily dosages.  Patient stated experiencing depressive symptoms, such as fatigue, tearfulness, isolation, feelings of worthless, guilt, loss of interest in pleasure activities, and recurrent thoughts about negative personal behaviors from his past.  Patient denies HI or self-injurious behaviors.   8/20 patient says that today he had fleeting thoughts of suicide again. He says that he thinks sometimes about crashing his car against a tree. Patient has been having trouble sleeping. He feels very depressed. He also has significant issues with memory and attention. He says that for the last year his forgetting everything. He is to the point where his wife has to manage all the finances in the house. He is forgetting his family members names, and he is doing very little driving.  0/10 patient reports feeling  very nervous and find himself trembling at times. He did not sleep at all last night. He says that his mood continues to be hopeless and depressed. He is still having suicidal thoughts but they are not as intense as they had been prior to admission. He says that he has multiple relatives who have committed suicide. His uncle and 2 cousins committed suicide.  He feels very guilty about not having energy or desire of doing things with his family. He feels that maybe his wife will be better without him finding a new husband.  8/22 patient feels very sedated this morning. He reported feeling also lightheaded. He was able to sleep very well last night but he is now very drowsy. Mood still depressed and hopeless. He denies SI or hallucinations. Per conversation with the patient's wife yesterday looks like the patient does have some possible delusional thinking at times.   Per nursing: Visible in the milieu, interacting well with peers and staffs, somewhat lucid, no indication of delirium, intact LOC, medications-combined-efforts discussed, "I don't know how the Lyrica got cut down to 200 mg, I used to take 600 mg...." Denied SI/HI/AVH.  Principal Problem: MDD (major depressive disorder), recurrent, severe, with psychosis ([place]) Diagnosis:   Patient Active Problem List   Diagnosis Date Noted  . Neurocognitive deficits [R29.818, R41.89] 06/23/2017  . HTN (hypertension) [I10] 06/23/2017  . Hyperlipidemia [E78.5] 06/23/2017  . GAD (generalized anxiety disorder) [F41.1] 06/23/2017  . MDD (major depressive disorder), recurrent, severe, with psychosis ([place]) [F33.3] 06/23/2017  . BPH (benign prostatic hyperplasia) [N40.0] 06/19/2017  . Chronic pain syndrome [G89.4] 06/19/2017  . 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 Hypertension   . Neuropathy    History reviewed. No pertinent surgical history.  Family History: History reviewed. No pertinent family history.  Family Psychiatric  History:Denies  Social History:  History  Alcohol Use No     History  Drug Use No    Social History   Social History  . Marital status: Married    Spouse name: N/A  . Number of children: N/A  . Years of education: N/A   Social History Main Topics  . Smoking status: Never Smoker  . Smokeless tobacco: Never Used  . Alcohol use No  . Drug use: No  . Sexual activity: Not Asked   Other Topics Concern  . None   Social History Narrative  . None     Current Medications: Current Facility-Administered Medications  Medication Dose Route Frequency Provider Last Rate Last Dose  . acetaminophen (TYLENOL) tablet 650 mg  650 mg Oral Q6H PRN Pucilowska, Jolanta B, MD      . alum & mag hydroxide-simeth (MAALOX/MYLANTA) 200-200-20 MG/5ML suspension 30 mL  30 mL Oral Q4H PRN Pucilowska, Jolanta B, MD      . amitriptyline (ELAVIL) tablet 50 mg  50 mg Oral QHS Hildred Priest, MD   50 mg at 06/23/17 2113  . amLODipine (NORVASC) tablet 10 mg  10 mg Oral Daily Pucilowska, Jolanta B, MD   10 mg at 06/24/17 0840  . antiseptic oral rinse (BIOTENE) solution 15 mL  15 mL Mouth Rinse PRN Clapacs, John T, MD      . clonazePAM Bobbye [place]) tablet 1 mg  1 mg Oral QHS Hildred Priest, MD   1 mg at 06/23/17 2113  . cyclobenzaprine (FLEXERIL) tablet 10 mg  10 mg Oral QHS Pucilowska, Jolanta B, MD   10 mg at 06/23/17 2112  . DULoxetine (CYMBALTA) DR capsule 40 mg  40 mg Oral Daily Hildred Priest, MD   40 mg at 06/24/17 0839  . [START ON 06/25/2017] hydrochlorothiazide (HYDRODIURIL) tablet 25 mg  25 mg Oral Daily Hildred Priest, MD      . hydrochlorothiazide (MICROZIDE) capsule 12.5 mg  12.5 mg Oral Once  Hildred Priest, MD      . HYDROcodone-acetaminophen ([place]) 7.5-325 MG per tablet 1 tablet  1 tablet Oral TID AC Clapacs, Madie Reno, MD   1 tablet at 06/24/17 0840  . lisinopril (PRINIVIL,ZESTRIL) tablet 40 mg  40 mg Oral Daily Pucilowska, Jolanta B, MD   40 mg at 06/24/17 0840  . magnesium hydroxide (MILK OF MAGNESIA) suspension 30 mL  30 mL Oral Daily PRN Pucilowska, Jolanta B, MD      . ondansetron (ZOFRAN-ODT) disintegrating tablet 4 mg  4 mg Oral Q8H PRN Hildred Priest, MD      . polyethylene glycol (MIRALAX / GLYCOLAX) packet 17 g  17 g Oral Daily Clapacs, Madie Reno, MD   17 g at 06/24/17 0839  . potassium chloride (KLOR-CON) packet 20 mEq  20 mEq Oral BID Clapacs, Madie Reno, MD   20 mEq at 06/24/17 0840  . pravastatin (PRAVACHOL) tablet 20 mg  20 mg Oral q1800 Pucilowska, Jolanta B, MD   20 mg at 06/23/17 1700  . pregabalin (LYRICA) capsule 200 mg  200 mg Oral TID Hildred Priest, MD  200 mg at 06/24/17 0841  . risperiDONE (RISPERDAL) tablet 1 mg  1 mg Oral QHS Hildred Priest, MD   1 mg at 06/23/17 2112    Lab Results:  Results for orders placed or performed during the hospital encounter of 06/19/17 (from the past 48 hour(s))  Vitamin B12     Status: None   Collection Time: 06/22/17  2:52 PM  Result Value Ref Range   Vitamin B-12 472 180 - 914 pg/mL    Comment: (NOTE) This assay is not validated for testing neonatal or myeloproliferative syndrome specimens for Vitamin B12 levels. Performed at [facility], [place] [address], [place], [place] [address]   Rapid HIV screen (HIV 1/2 Ab+Ag)     Status: None   Collection Time: 06/22/17  2:52 PM  Result Value Ref Range   HIV-1 P24 Antigen - HIV24 NON REACTIVE NON REACTIVE   HIV 1/2 Antibodies NON REACTIVE NON REACTIVE   Interpretation (HIV Ag Ab)      A non reactive test result means that HIV 1 or HIV 2 antibodies and HIV 1 p24 antigen were not detected in the specimen.  RPR     Status: None    Collection Time: 06/22/17  2:52 PM  Result Value Ref Range   RPR Ser Ql Non Reactive Non Reactive    Comment: (NOTE) Performed At: [facility] [place], [place] [id number] Lindon Romp MD OZ:[id number]   Basic metabolic panel     Status: Abnormal   Collection Time: 06/22/17  2:52 PM  Result Value Ref Range   Sodium 141 135 - 145 mmol/L   Potassium 4.2 3.5 - 5.1 mmol/L   Chloride 103 101 - 111 mmol/L   CO2 29 22 - 32 mmol/L   Glucose, Bld 69 65 - 99 mg/dL   BUN 27 (H) 6 - 20 mg/dL   Creatinine, Ser 1.19 0.61 - 1.24 mg/dL   Calcium 9.6 8.9 - 10.3 mg/dL   GFR calc non Af Amer >60 >60 mL/min   GFR calc Af Amer >60 >60 mL/min    Comment: (NOTE) The eGFR has been calculated using the CKD EPI equation. This calculation has not been validated in all clinical situations. eGFR's persistently <60 mL/min signify possible Chronic Kidney Disease.    Anion gap 9 5 - 15  Ammonia     Status: None   Collection Time: 06/22/17  2:52 PM  Result Value Ref Range   Ammonia 31 9 - 35 umol/L  Glucose, capillary     Status: None   Collection Time: 06/22/17  8:50 PM  Result Value Ref Range   Glucose-Capillary 98 65 - 99 mg/dL  Glucose, capillary     Status: None   Collection Time: 06/23/17  7:02 AM  Result Value Ref Range   Glucose-Capillary 92 65 - 99 mg/dL  Urinalysis, Complete w Microscopic     Status: Abnormal   Collection Time: 06/23/17  9:29 AM  Result Value Ref Range   Color, Urine YELLOW (A) YELLOW   APPearance CLEAR (A) CLEAR   Specific Gravity, Urine 1.013 1.005 - 1.030   pH 7.0 5.0 - 8.0   Glucose, UA NEGATIVE NEGATIVE mg/dL   Hgb urine dipstick NEGATIVE NEGATIVE   Bilirubin Urine NEGATIVE NEGATIVE   Ketones, ur NEGATIVE NEGATIVE mg/dL   Protein, ur NEGATIVE NEGATIVE mg/dL   Nitrite NEGATIVE NEGATIVE   Leukocytes, UA NEGATIVE NEGATIVE   RBC / HPF NONE SEEN 0 - 5 RBC/hpf  WBC, UA 0-5 0 - 5 WBC/hpf   Bacteria, UA NONE SEEN NONE SEEN   Squamous  Epithelial / LPF 0-5 (A) NONE SEEN    Blood Alcohol level:  Lab Results  Component Value Date   ETH <5 77/82/4235    Metabolic Disorder Labs: Lab Results  Component Value Date   HGBA1C 5.5 06/20/2017   MPG 111.15 06/20/2017   No results found for: PROLACTIN Lab Results  Component Value Date   CHOL 140 06/20/2017   TRIG 106 06/20/2017   HDL 29 (L) 06/20/2017   CHOLHDL 4.8 06/20/2017   VLDL 21 06/20/2017   LDLCALC 90 06/20/2017    Physical Findings: AIMS: Facial and Oral Movements Muscles of Facial Expression: None, normal Lips and Perioral Area: None, normal Jaw: None, normal Tongue: None, normal,Extremity Movements Upper (arms, wrists, hands, fingers): None, normal Lower (legs, knees, ankles, toes): None, normal, Trunk Movements Neck, shoulders, hips: None, normal, Overall Severity Severity of abnormal movements (highest score from questions above): None, normal Patient's awareness of abnormal movements (rate only patient's report): No Awareness, Dental Status Current problems with teeth and/or dentures?: No Does patient usually wear dentures?: No  CIWA:  CIWA-Ar Total: 0 COWS:  COWS Total Score: 2  Musculoskeletal: Strength & Muscle Tone: within normal limits Gait & Station: normal Patient leans: N/A  Psychiatric Specialty Exam: Physical Exam  Constitutional: He is oriented to person, place, and time. He appears well-developed and well-nourished.  HENT:  Head: Normocephalic and atraumatic.  Eyes: Conjunctivae and EOM are normal.  Neck: Normal range of motion.  Respiratory: Effort normal.  Musculoskeletal: Normal range of motion.  Neurological: He is alert and oriented to person, place, and time.    Review of Systems  Constitutional: Negative.   HENT: Negative.   Eyes: Negative.   Respiratory: Negative.   Cardiovascular: Negative.   Gastrointestinal: Negative.   Genitourinary: Negative.   Musculoskeletal: Positive for joint pain.  Skin: Negative.    Neurological: Negative.   Endo/Heme/Allergies: Negative.   Psychiatric/Behavioral: Positive for depression and memory loss. Negative for hallucinations, substance abuse and suicidal ideas. The patient has insomnia. The patient is not nervous/anxious.     Blood pressure (!) 173/106, pulse 73, temperature 97.8 F (36.6 C), temperature source Oral, resp. rate 18, height '6\' 1"'$  (1.854 m), weight 99.5 kg (219 lb 6.4 oz), SpO2 100 %.Body mass index is 28.95 kg/m.  General Appearance: Disheveled  Eye Contact:  Good  Speech:  Slow  Volume:  Decreased  Mood:  Dysphoric  Affect:  Blunt  Thought Process:  Linear and Descriptions of Associations: Intact  Orientation:  Full (Time, Place, and Person)  Thought Content:  Hallucinations: None  Suicidal Thoughts:  Yes.  without intent/plan  Homicidal Thoughts:  No  Memory:  Immediate;   Poor Recent;   Poor Remote;   Good  Judgement:  Fair  Insight:  Fair  Psychomotor Activity:  Decreased  Concentration:  Concentration: Poor and Attention Span: Poor  Recall:  Poor  Fund of Knowledge:  Good  Language:  Good  Akathisia:  no  Handed:    AIMS (if indicated):     Assets:  Communication Skills Desire for Improvement Social Support  ADL's:  Intact  Cognition:  Impaired,  Moderate  Sleep:  Number of Hours: 7.15     Treatment Plan Summary: Daily contact with patient to assess and evaluate symptoms and progress in treatment and Medication management   Major depressive disorder with psychosis: Continue Cymbalta  40 mg a  day. For insomnia, chronic pain he will be continued on amitriptyline 50 mg a day. He feels that his gait is less unsteady.  Psychosis: started on risperdal '1mg'$  qhs  GAD/insomnia: I we will decrease clonazepam to 0.5 mg daily at bedtime only  Cognitive problems: psychological assessment completed yesterday "Overall, this evaluation suggests the presence of a cognitive deficit. Given the recent loss of [name] mother, his level  of grief and loss may have contributed to the performance seen today. It is also possible that his cognitive processes are deteriorating give difficulties prior to his mother's death. Retesting for the presence of cognitive deficits is recommended after 6 months to a year if memory, anxiety and concentration difficulties remain an issue."  Chronic pain patient will be continued on: Norco 3 times a day, Lyrica 200 mg 3 times a day and Flexeril 10 mg daily at bedtime.  Hypertension he'll be continued on Norvasc 10 mg a day and lisinopril 40 mg a day. Hydrochlorothiazide has been added and I will increase it today to a total of 25 mg a day.  Hypokalemia continue K-Dur 20 milliequivalents twice a day---K is wnl  Dyslipidemia continue Pravachol 20 mg a day  Constipation continue MiraLAX by mouth daily  Dry mouth continue by attending mouthwash   Labs: BMP wnl, ammonia, TSH, B12, HIV, RPR neg/WNL  Head CT: Moderate chronic microvascular ischemic type changes in the cerebral white matter.  EKG: Within normal limits  Possible discharge in the next 5-7 days--- high risk for suicide hopelessness, suicidal, chronic pain, age, race, psychotic symptoms, significant family history of depression and suicide.  Collateral obtained from wife on 8/21: Spoke with the patient's wife today. She reports that he has always have depression however this year due to depression seems to have worsened especially after the death of his mother, however he was struggling with mood even before she passed away.  Wife reports that he does not want to engage in any special activities like going out on vacation or even going out to eat. He is overly concerned with their finances thinking that they have no money. He also has been talking that the family is going to place him in an assisted living facility. He has been thinking that the wife is planning to leave him.  Wife says that for the last 5 years she has not is that he  will incorporate all the people stories into his life. For example he had talked about having an argument with his wife about something that'll never happen or he was talking about him having a lemon tree in their property, which he never had and  the wife knows for a fact that what he was talking is something they heard from one of their friends--this could be confabulation.  He told his wife yesterday that he was having very dark and gross thoughts.  Based on this information I will change the patient's diagnosis to major depressive disorder with psychotic features. I also will add the diagnosis of generalized anxiety disorder as the wife describes a pattern of excessive worrying--- almost obsessive.   Hildred Priest, MD 06/24/2017, 9:56 AM

## 2017-06-24 NOTE — Progress Notes (Signed)
Patient ID: Joel Gross, male   DOB: 03/13/1951, 66 y.o.   MRN: [id number] Visible in the milieu, interacting well with peers and staffs, somewhat lucid, no indication of delirium, intact LOC, medications-combined-efforts discussed, "I don't know how the Lyrica got cut down to 200 mg, I used to take 600 mg...." Denied SI/HI/AVH.

## 2017-06-25 MED ORDER — AMITRIPTYLINE HCL 50 MG PO TABS: 50.0000 mg | ORAL_TABLET | Freq: Every day | ORAL | 0 refills | Status: DC

## 2017-06-25 MED ORDER — RISPERIDONE 1 MG PO TABS: 1.0000 mg | ORAL_TABLET | Freq: Every day | ORAL | 0 refills | Status: DC

## 2017-06-25 MED ORDER — CLONAZEPAM 0.5 MG PO TABS: 0.5000 mg | ORAL_TABLET | Freq: Every day | ORAL | 0 refills | Status: DC

## 2017-06-25 MED ORDER — PREGABALIN 200 MG PO CAPS: 200.0000 mg | ORAL_CAPSULE | Freq: Three times a day (TID) | ORAL | 0 refills | Status: AC

## 2017-06-25 MED ORDER — DULOXETINE HCL 60 MG PO CPEP: 60.0000 mg | ORAL_CAPSULE | Freq: Every day | ORAL | 0 refills | Status: DC

## 2017-06-25 MED ORDER — HYDROCHLOROTHIAZIDE 25 MG PO TABS: 25.0000 mg | ORAL_TABLET | Freq: Every day | ORAL | 0 refills | Status: DC

## 2017-06-25 NOTE — Plan of Care (Signed)
Problem: BHH Participation in Recreation Therapeutic Interventions Goal: STG-Patient will demonstrate improved self esteem by identif STG: Self-Esteem - Within 4 treatment sessions, patient will verbalize at least 5 positive affirmation statements in each of 2 treatment sessions to increase self-esteem.  Outcome: Progressing Treatment Session 1; Completed 1 out of 2: At approximately 2:15 pm, LRT met with patient in consultation room. Patient verbalized 5 positive affirmation statements. Patient reported it felt "pretty good". LRT encouraged patient to continue saying positive affirmation statements.   M , LRT/CTRS 08.23.18 3:41 pm Goal: STG-Other Recreation Therapy Goal (Specify) STG: Stress Management - Within 4 treatment sessions, patient will verbalize understanding of the stress management techniques in each of 2 treatment sessions to increase stress management skills.  Outcome: Progressing Treatment Session 1; Completed 1 out of 2: At approximately 2:15 pm, LRT met with patient in consultation room. LRT educated and provided patient with handouts on stress management techniques. Patient verbalized understanding. LRT encouraged patient to read over and practice the stress management techniques.   M , LRT/CTRS 08.23.18 3:42 pm   

## 2017-06-25 NOTE — Care Management Note (Signed)
Case Management Note  Patient Details  Name: TARIS GALINDO MRN: [id number] Date of Birth: 07/14/1951  Subjective/Objective:                    Action/Plan:   Expected Discharge Date:  06/26/17               Expected Discharge Plan:     In-House Referral:     Discharge planning Services  Follow-up appt scheduled, CM Consult  Post Acute Care Choice:    Choice offered to:     DME Arranged:    DME Agency:     HH Arranged:    [place] Agency:     Status of Service:  Completed, signed off  If discussed at [employer] of Stay Meetings, dates discussed:    Additional Comments:  Orlean Bradford, RN 06/25/2017, 2:10 PM

## 2017-06-25 NOTE — Plan of Care (Signed)
Problem: Activity: Goal: Sleeping patterns will improve Outcome: Progressing Patient slept for Estimated Hours of 5.45; Precautionary checks every 15 minutes for safety maintained, room free of safety hazards, patient sustains no injury or falls during this shift.    

## 2017-06-25 NOTE — Progress Notes (Signed)
Pt pleasant and cooperative. Denies SI, HI, AVH. Denies racing thoughts. Eating meals well. Appropriate with staff and peers. Med and group compliant. Encouragement and support offered. Safety checks maintained. Pt receptive and remains safe on unit with q 15 min checks.

## 2017-06-25 NOTE — B[facility] Suicide Risk Assessment (Signed)
[facility] Discharge Suicide Risk Assessment   Principal Problem: MDD (major depressive disorder), recurrent, severe, with psychosis ([place]) Discharge Diagnoses:  Patient Active Problem List   Diagnosis Date Noted  . Neurocognitive deficits [R29.818, R41.89] 06/23/2017  . HTN (hypertension) [I10] 06/23/2017  . Hyperlipidemia [E78.5] 06/23/2017  . GAD (generalized anxiety disorder) [F41.1] 06/23/2017  . MDD (major depressive disorder), recurrent, severe, with psychosis ([place]) [F33.3] 06/23/2017  . BPH (benign prostatic hyperplasia) [N40.0] 06/19/2017  . Chronic pain syndrome [G89.4] 06/19/2017  . 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 rate 19, height 6\' 1"  (1.854 m), weight 99.5 kg (219 lb 6.4 oz), SpO2 99 %.Body mass index is 28.95 kg/m.                                                       Mental Status Per Nursing Assessment::   On Admission:     Demographic Factors:  Male, Age 1 or older and Caucasian  Loss Factors: Loss of significant relationship and Decline in physical health  Historical Factors: Family history of suicide  Risk Reduction Factors:   Sense of responsibility to family, Religious beliefs about death, Living with another person, especially a relative and Positive social support  Continued Clinical Symptoms:  Chronic Pain Previous Psychiatric Diagnoses and Treatments  Cognitive Features That Contribute To Risk:  Polarized thinking    Suicide Risk:  Minimal: No identifiable suicidal ideation.  Patients presenting with no risk factors but with morbid ruminations; may be classified as minimal risk based on the severity of the depressive symptoms  Follow-up Information    Ronita Hipps, MD Follow up.   Specialty:  Family Medicine Contact  information: [place] [phone]            Hildred Priest, MD 06/25/2017, 1:58 PM

## 2017-06-25 NOTE — Plan of Care (Signed)
Problem: Coping: Goal: Ability to cope will improve Outcome: Progressing Able to verbalize coping skills

## 2017-06-25 NOTE — B[facility] Group Notes (Signed)
Goals Group Date/Time: 06/25/2017 9:00 AM Type of Therapy and Topic: Group Therapy: Goals Group: SMART Goals   Participation Level: Moderate  Description of Group:    The purpose of a daily goals group is to assist and guide patients in setting recovery/wellness-related goals. The objective is to set goals as they relate to the crisis in which they were admitted. Patients will be using SMART goal modalities to set measurable goals. Characteristics of realistic goals will be discussed and patients will be assisted in setting and processing how one will reach their goal. Facilitator will also assist patients in applying interventions and coping skills learned in psycho-education groups to the SMART goal and process how one will achieve defined goal.   Therapeutic Goals:   -Patients will develop and document one goal related to or their crisis in which brought them into treatment.  -Patients will be guided by LCSW using SMART goal setting modality in how to set a measurable, attainable, realistic and time sensitive goal.  -Patients will process barriers in reaching goal.  -Patients will process interventions in how to overcome and successful in reaching goal.   Patient's Goal: to be kinder to others: CSW coached pt to be more specific that he would try to be kind to 2 people today.  Also to manage negative thoughts.   Therapeutic Modalities:  Motivational Interviewing  Art gallery manager  SMART goals setting   Lurline Idol, [place]

## 2017-06-25 NOTE — Progress Notes (Signed)
Recreation Therapy Notes  Date: 08.23.18 Time: 1:00 pm Location: Craft Room  Group Topic: Leisure Education  Goal Area(s) Addresses:  Patient will identify activities for each letter of the alphabet. Patient will verbalize ability to integrate positive leisure into life post d/c. Patient will verbalize ability to use leisure as a Technical sales engineer.  Behavioral Response: Attentive, Interactive  Intervention: Leisure Alphabet  Activity: Patients were given a Leisure Air traffic controller and were instructed to write healthy leisure activities for each letter of the [place].  Education: LRT educated patients on what they need to participate in leisure.  Education Outcome: Acknowledges education/In group clarification offered   Clinical Observations/Feedback: Patient wrote healthy leisure activities. Patient contributed to group discussion by stating healthy leisure activities, what he needs to participate in leisure, and how to integrate leisure into his schedule.  Leonette Monarch, LRT/CTRS 06/25/2017 1:53 PM

## 2017-06-25 NOTE — Progress Notes (Signed)
Patient ID: Joel Gross, male   DOB: June 06, 1951, 66 y.o.   MRN: [id number] Bright affect, mood appropriate, interacting well with peers and staff, coherent, thoughtful, great insight, respect others, denied SI/HI/AVH; appears to be at functioning baseline.

## 2017-06-25 NOTE — Progress Notes (Signed)
[facility] MD Progress Note  06/25/2017 12:38 PM Joel Gross  MRN:  [id number] Subjective:  Joel Gross is an 66 y.o.married male, who voluntarily came into [facility].  [place], [place]. Patient reported contacting a mobile crisis line on 06/18/2017 and being referred to [facility] in [place], [place].  Patient stated that after speaking with a representative at [facility], he was referred to come into the ED.  Patient reported having ongoing medical concerns, relating to pain in his leg.  Patient stated that, as a result, he has been experiencing ongoing suicidal ideations, over the past couple of months.  Patient reported having a current suicidal ideation with a plan to overdose with his prescribed medications or shoot himself in the mouth with a gun. Patient reported having access to his medications, however stated that his gun was locked in a safe, by his daughter.  Patient reported experiencing auditory hallucinations consisting of hearing voices and visual hallucinations consisting of seeing shadows and numbers.  Patient reported currently being prescribed medications for his pain and being compliant with daily dosages.  Patient stated experiencing depressive symptoms, such as fatigue, tearfulness, isolation, feelings of worthless, guilt, loss of interest in pleasure activities, and recurrent thoughts about negative personal behaviors from his past.  Patient denies HI or self-injurious behaviors.   8/20 patient says that today he had fleeting thoughts of suicide again. He says that he thinks sometimes about crashing his car against a tree. Patient has been having trouble sleeping. He feels very depressed. He also has significant issues with memory and attention. He says that for the last year his forgetting everything. He is to the point where his wife has to manage all the finances in the house. He is forgetting his family members names, and he is doing very little driving.  5/63 patient reports feeling  very nervous and find himself trembling at times. He did not sleep at all last night. He says that his mood continues to be hopeless and depressed. He is still having suicidal thoughts but they are not as intense as they had been prior to admission. He says that he has multiple relatives who have committed suicide. His uncle and 2 cousins committed suicide.  He feels very guilty about not having energy or desire of doing things with his family. He feels that maybe his wife will be better without him finding a new husband.  8/22 patient feels very sedated this morning. He reported feeling also lightheaded. He was able to sleep very well last night but he is now very drowsy. Mood still depressed and hopeless. He denies SI or hallucinations. Per conversation with the patient's wife yesterday looks like the patient does have some possible delusional thinking at times.   8/23 patient reports he feels a little bit better today. He grades his mood as an 8 out of 10. He denies suicidal thoughts. He said he has been is sleeping better. He says that medications make him feel agitated however he was unable to really explain this. Denies any other side effects, as far as physical complaints he complains of back pain but says the pain is well controlled.  Per nursing: Visible in the milieu, interacting well with peers and staffs, somewhat lucid, no indication of delirium, intact LOC, medications-combined-efforts discussed, "I don't know how the Lyrica got cut down to 200 mg, I used to take 600 mg...." Denied SI/HI/AVH.  Principal Problem: MDD (major depressive disorder), recurrent, severe, with psychosis ([place]) Diagnosis:   Patient  Active Problem List   Diagnosis Date Noted  . Neurocognitive deficits [R29.818, R41.89] 06/23/2017  . HTN (hypertension) [I10] 06/23/2017  . Hyperlipidemia [E78.5] 06/23/2017  . GAD (generalized anxiety disorder) [F41.1] 06/23/2017  . MDD (major depressive disorder), recurrent,  severe, with psychosis ([place]) [F33.3] 06/23/2017  . BPH (benign prostatic hyperplasia) [N40.0] 06/19/2017  . Chronic pain syndrome [G89.4] 06/19/2017  . 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 Hypertension   . Neuropathy    History reviewed. No pertinent surgical history.  Family History: History reviewed. No pertinent family history.  Family Psychiatric  History:Denies  Social History:  History  Alcohol Use No     History  Drug Use No    Social History   Social History  . Marital status: Married    Spouse name: N/A  . Number of children: N/A  . Years of education: N/A   Social History Main Topics  . Smoking status: Never Smoker  . Smokeless tobacco: Never Used  . Alcohol use No  . Drug use: No  . Sexual activity: Not Asked   Other Topics Concern  . None   Social History Narrative  . None     Current Medications: Current Facility-Administered Medications  Medication Dose Route Frequency Provider Last Rate Last Dose  . acetaminophen (TYLENOL) tablet 650 mg  650 mg Oral Q6H PRN Pucilowska, Jolanta B, MD      . alum & mag hydroxide-simeth (MAALOX/MYLANTA) 200-200-20 MG/5ML suspension 30 mL  30 mL Oral Q4H PRN Pucilowska, Jolanta B, MD      . amitriptyline (ELAVIL) tablet 50 mg  50 mg Oral QHS Hildred Priest, MD   50 mg at 06/24/17 2109  . amLODipine (NORVASC) tablet 10 mg  10 mg Oral Daily Pucilowska, Jolanta B, MD   10 mg at 06/25/17 0831  . antiseptic oral rinse (BIOTENE) solution 15 mL  15 mL Mouth Rinse PRN Clapacs, John T, MD      . clonazePAM Bobbye [place]) tablet 0.5 mg  0.5 mg Oral QHS Hildred Priest, MD   0.5 mg at 06/24/17 2109  . cyclobenzaprine (FLEXERIL) tablet 10  mg  10 mg Oral QHS Pucilowska, Jolanta B, MD   10 mg at 06/24/17 2109  . DULoxetine (CYMBALTA) DR capsule 40 mg  40 mg Oral Daily Hildred Priest, MD   40 mg at 06/25/17 0831  . hydrochlorothiazide (HYDRODIURIL) tablet 25 mg  25 mg Oral Daily Hildred Priest, MD   25 mg at 06/25/17 0831  . HYDROcodone-acetaminophen (NORCO) 7.5-325 MG per tablet 1 tablet  1 tablet Oral TID AC Clapacs, Madie Reno, MD   1 tablet at 06/25/17 1234  . lisinopril (PRINIVIL,ZESTRIL) tablet 40 mg  40 mg Oral Daily Pucilowska, Jolanta B, MD   40 mg at 06/25/17 0831  . magnesium hydroxide (MILK OF MAGNESIA) suspension 30 mL  30 mL Oral Daily PRN Pucilowska, Jolanta B, MD      . ondansetron (ZOFRAN-ODT) disintegrating tablet 4 mg  4 mg Oral Q8H PRN Hildred Priest, MD      . polyethylene glycol (MIRALAX / GLYCOLAX) packet 17 g  17 g Oral Daily Clapacs, Madie Reno, MD   17 g at 06/25/17 0831  . potassium chloride (KLOR-CON) packet 20 mEq  20 mEq Oral  BID Clapacs, Madie Reno, MD   20 mEq at 06/25/17 0831  . pravastatin (PRAVACHOL) tablet 20 mg  20 mg Oral q1800 Pucilowska, Jolanta B, MD   20 mg at 06/24/17 1725  . pregabalin (LYRICA) capsule 200 mg  200 mg Oral TID Hildred Priest, MD   200 mg at 06/25/17 1234  . risperiDONE (RISPERDAL) tablet 1 mg  1 mg Oral QHS Hildred Priest, MD   1 mg at 06/24/17 2129    Lab Results:  No results found for this or any previous visit (from the past 48 hour(s)).  Blood Alcohol level:  Lab Results  Component Value Date   ETH <5 31/51/7616    Metabolic Disorder Labs: Lab Results  Component Value Date   HGBA1C 5.5 06/20/2017   MPG 111.15 06/20/2017   No results found for: PROLACTIN Lab Results  Component Value Date   CHOL 140 06/20/2017   TRIG 106 06/20/2017   HDL 29 (L) 06/20/2017   CHOLHDL 4.8 06/20/2017   VLDL 21 06/20/2017   LDLCALC 90 06/20/2017    Physical Findings: AIMS: Facial and Oral Movements Muscles of Facial  Expression: None, normal Lips and Perioral Area: None, normal Jaw: None, normal Tongue: None, normal,Extremity Movements Upper (arms, wrists, hands, fingers): None, normal Lower (legs, knees, ankles, toes): None, normal, Trunk Movements Neck, shoulders, hips: None, normal, Overall Severity Severity of abnormal movements (highest score from questions above): None, normal Patient's awareness of abnormal movements (rate only patient's report): No Awareness, Dental Status Current problems with teeth and/or dentures?: No Does patient usually wear dentures?: No  CIWA:  CIWA-Ar Total: 0 COWS:  COWS Total Score: 2  Musculoskeletal: Strength & Muscle Tone: within normal limits Gait & Station: normal Patient leans: N/A  Psychiatric Specialty Exam: Physical Exam  Constitutional: He is oriented to person, place, and time. He appears well-developed and well-nourished.  HENT:  Head: Normocephalic and atraumatic.  Eyes: Conjunctivae and EOM are normal.  Neck: Normal range of motion.  Respiratory: Effort normal.  Musculoskeletal: Normal range of motion.  Neurological: He is alert and oriented to person, place, and time.    Review of Systems  Constitutional: Negative.   HENT: Negative.   Eyes: Negative.   Respiratory: Negative.   Cardiovascular: Negative.   Gastrointestinal: Negative.   Genitourinary: Negative.   Musculoskeletal: Positive for joint pain.  Skin: Negative.   Neurological: Negative.   Endo/Heme/Allergies: Negative.   Psychiatric/Behavioral: Positive for depression and memory loss. Negative for hallucinations, substance abuse and suicidal ideas. The patient has insomnia. The patient is not nervous/anxious.     Blood pressure 138/87, pulse 71, temperature 97.9 F (36.6 C), temperature source Oral, resp. rate 19, height 6\' 1"  (1.854 m), weight 99.5 kg (219 lb 6.4 oz), SpO2 99 %.Body mass index is 28.95 kg/m.  General Appearance: Disheveled  Eye Contact:  Good  Speech:  Slow   Volume:  Decreased  Mood:  Dysphoric  Affect:  Blunt  Thought Process:  Linear and Descriptions of Associations: Intact  Orientation:  Full (Time, Place, and Person)  Thought Content:  Hallucinations: None  Suicidal Thoughts:  Yes.  without intent/plan  Homicidal Thoughts:  No  Memory:  Immediate;   Poor Recent;   Poor Remote;   Good  Judgement:  Fair  Insight:  Fair  Psychomotor Activity:  Decreased  Concentration:  Concentration: Poor and Attention Span: Poor  Recall:  Poor  Fund of Knowledge:  Good  Language:  Good  Akathisia:  no  Handed:  AIMS (if indicated):     Assets:  Communication Skills Desire for Improvement Social Support  ADL's:  Intact  Cognition:  Impaired,  Moderate  Sleep:  Number of Hours: 5.45     Treatment Plan Summary: Daily contact with patient to assess and evaluate symptoms and progress in treatment and Medication management   Major depressive disorder with psychosis: Continue Cymbalta  40 mg a day. For insomnia, chronic pain he will be continued on amitriptyline 50 mg a day. He feels that his gait is less unsteady.  Psychosis: started on risperdal 1mg  qhs--- less worried and less anxious  GAD/insomnia: Patient is doing better on clonazepam 0.5 mg by mouth daily at bedtime. He has been is sleeping better and seems less anxious  Cognitive problems: psychological assessment completed yesterday "Overall, this evaluation suggests the presence of a cognitive deficit. Given the recent loss of [name] mother, his level of grief and loss may have contributed to the performance seen today. It is also possible that his cognitive processes are deteriorating give difficulties prior to his mother's death. Retesting for the presence of cognitive deficits is recommended after 6 months to a year if memory, anxiety and concentration difficulties remain an issue."  Chronic pain patient will be continued on: Norco 3 times a day, Lyrica 200 mg 3 times a day and  Flexeril 10 mg daily at bedtime.  Hypertension he'll be continued on Norvasc 10 mg a day and lisinopril 40 mg a day. Hydrochlorothiazide has been added and I will increase it today to a total of 25 mg a day.  Hypokalemia continue K-Dur 20 milliequivalents twice a day---K is wnl  Dyslipidemia continue Pravachol 20 mg a day  Constipation continue MiraLAX by mouth daily  Dry mouth continue by attending mouthwash   Labs: BMP wnl, ammonia, TSH, B12, HIV, RPR neg/WNL  Head CT: Moderate chronic microvascular ischemic type changes in the cerebral white matter.  EKG: Within normal limits  Possible discharge in the next few days. He feels better today and is requesting to be discharged home. I will discuss this with his wife today.--- high risk for suicide hopelessness, suicidal, chronic pain, age, race, psychotic symptoms, significant family history of depression and suicide.  Collateral obtained from wife on 8/21: Spoke with the patient's wife today. She reports that he has always have depression however this year due to depression seems to have worsened especially after the death of his mother, however he was struggling with mood even before she passed away.  Wife reports that he does not want to engage in any special activities like going out on vacation or even going out to eat. He is overly concerned with their finances thinking that they have no money. He also has been talking that the family is going to place him in an assisted living facility. He has been thinking that the wife is planning to leave him.  Wife says that for the last 5 years she has not is that he will incorporate all the people stories into his life. For example he had talked about having an argument with his wife about something that'll never happen or he was talking about him having a lemon tree in their property, which he never had and  the wife knows for a fact that what he was talking is something they heard from one of  their friends--this could be confabulation.  He told his wife yesterday that he was having very dark and gross thoughts.  Based on  this information I will change the patient's diagnosis to major depressive disorder with psychotic features. I also will add the diagnosis of generalized anxiety disorder as the wife describes a pattern of excessive worrying--- almost obsessive.   Hildred Priest, MD 06/25/2017, 12:38 PM

## 2017-06-25 NOTE — BHH Group Notes (Signed)
[facility] LCSW Group Therapy Note  Date/Time: 06/25/17, 0930  Type of Therapy/Topic:  Group Therapy:  Balance in Life  Participation Level:  active  Description of Group:    This group will address the concept of balance and how it feels and looks when one is unbalanced. Patients will be encouraged to process areas in their lives that are out of balance, and identify reasons for remaining unbalanced. Facilitators will guide patients utilizing problem- solving interventions to address and correct the stressor making their life unbalanced. Understanding and applying boundaries will be explored and addressed for obtaining  and maintaining a balanced life. Patients will be encouraged to explore ways to assertively make their unbalanced needs known to significant others in their lives, using other group members and facilitator for support and feedback.  Therapeutic Goals: 1. Patient will identify two or more emotions or situations they have that consume much of in their lives. 2. Patient will identify signs/triggers that life has become out of balance:  3. Patient will identify two ways to set boundaries in order to achieve balance in their lives:  4. Patient will demonstrate ability to communicate their needs through discussion and/or role plays  Summary of Patient Progress: Pt identified work, more specifically, lack of work since he retired several years ago, as being out of balance.  Pt also identified family, as multiple family members live close by.  Pt shared with the group how he has struggled to find ways to use his time since retirement and how physical issues have limited his choices as well.  Good participation throughout.          Therapeutic Modalities:   Cognitive Behavioral Therapy Solution-Focused Therapy Assertiveness Training  Lurline Idol, [place]

## 2017-06-26 ENCOUNTER — Encounter (HOSPITAL_COMMUNITY): Payer: Self-pay | Admitting: Licensed Clinical Social Worker

## 2017-06-26 ENCOUNTER — Ambulatory Visit (INDEPENDENT_AMBULATORY_CARE_PROVIDER_SITE_OTHER): Payer: Medicare HMO | Admitting: Licensed Clinical Social Worker

## 2017-06-26 DIAGNOSIS — F333 Major depressive disorder, recurrent, severe with psychotic symptoms: Secondary | ICD-10-CM

## 2017-06-26 NOTE — Progress Notes (Signed)
Patient denies SI/HI, denies A/V hallucinations. Patient verbalizes understanding of discharge instructions, follow up care and prescriptions. Patient given all belongings from  locker. Patient escorted out by staff, transported by family. 

## 2017-06-26 NOTE — BHH Suicide Risk Assessment (Signed)
[facility] Discharge Suicide Risk Assessment   Principal Problem: MDD (major depressive disorder), recurrent, severe, with psychosis ([place]) Discharge Diagnoses:  Patient Active Problem List   Diagnosis Date Noted  . Neurocognitive deficits [R29.818, R41.89] 06/23/2017  . HTN (hypertension) [I10] 06/23/2017  . Hyperlipidemia [E78.5] 06/23/2017  . GAD (generalized anxiety disorder) [F41.1] 06/23/2017  . MDD (major depressive disorder), recurrent, severe, with psychosis ([place]) [F33.3] 06/23/2017  . BPH (benign prostatic hyperplasia) [N40.0] 06/19/2017  . Chronic pain syndrome [G89.4] 06/19/2017  . 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 rate 19, height 6\' 1"  (1.854 m), weight 99.5 kg (219 lb 6.4 oz), SpO2 99 %.Body mass index is 28.95 kg/m.                                                       Mental Status Per Nursing Assessment::   On Admission:     Demographic Factors:  Male, Age 20 or older and Caucasian  Loss Factors: Loss of significant relationship and Decline in physical health  Historical Factors: Family history of suicide  Risk Reduction Factors:   Sense of responsibility to family, Religious beliefs about death, Living with another person, especially a relative and Positive social support  Continued Clinical Symptoms:  Chronic Pain Previous Psychiatric Diagnoses and Treatments  Cognitive Features That Contribute To Risk:  Polarized thinking    Suicide Risk:  Minimal: No identifiable suicidal ideation.  Patients presenting with no risk factors but with morbid ruminations; may be classified as minimal risk based on the severity of the depressive symptoms  Follow-up Information    Ronita Hipps, MD. Go on 07/03/2017.   Specialty:  Family  Medicine Why:  Your appointment is at 11:30. Please bring your insurance card and discharge papaerwork with you. Contact information: [place]. Go on 06/26/2017.   Why:  Please follow-up with [facility] for your initial therapy session on August 24th at [place]. Please bring discharge paperwork to this appointment. If you have questions or concerns, contact office directly. Contact information: Address: [address] [place], [place] [address] Phone: [phone] Fax: [phone]       [place]. Go on 08/24/2017.   Why:  Please follow-up with Dr. Daron Offer on October 22nd at [facility] for medication management. Contact information: [address] [place], [place] [address] Phone: [phone] Fax: [phone]       [facility] [place] [facility]. Go on 06/29/2017.   Why:  Please follow-up with [facility] on August 27th at [facility] for your intake assessment. If you have questions or concerns, contact office directly.  Contact information: [place] [address] [phone]            Orson Slick, MD 06/26/2017, 9:03 AM

## 2017-06-26 NOTE — Discharge Summary (Signed)
Physician Discharge Summary Note  Patient:  Joel Gross is an 66 y.o., male MRN:  [id number] DOB:  Jan 15, 1951 Patient phone:  [phone] (home)  Patient address:   [place] [address],  Total Time spent with patient: 30 minutes  Date of Admission:  06/19/2017 Date of Discharge: 06/26/17  Reason for Admission:  SI  Principal Problem: MDD (major depressive disorder), recurrent, severe, with psychosis [facility]) Discharge Diagnoses: Patient Active Problem List   Diagnosis Date Noted  . Neurocognitive deficits [R29.818, R41.89] 06/23/2017  . HTN (hypertension) [I10] 06/23/2017  . Hyperlipidemia [E78.5] 06/23/2017  . GAD (generalized anxiety disorder) [F41.1] 06/23/2017  . MDD (major depressive disorder), recurrent, severe, with psychosis ([place]) [F33.3] 06/23/2017  . BPH (benign prostatic hyperplasia) [N40.0] 06/19/2017  . Chronic pain syndrome [G89.4] 06/19/2017  . 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 Patient reports that he has had some symptoms of depression for several months but that things only really seeme to pile up to a point of severity about a month or so ago. His mother died in 05/28/2023 of this year and this left him the executor of her of her house. This has caused some stress within his immediate family including his sister. On top of his is his doctors had been cutting him back on his pain medicine. He says over the last couple weeks he began to feel overwhelmed and was feeling hopeless. During that time he was sleeping okay but his appetite dropped off. Hes no longer enjoying normal activation. Over the last several days he had started to develop frequent suicidal thoughts. Contemplated shooting himself with his gun or running his car off the road. Patient called the suicide hotline and they referred him to local mental healthwho referred him to the hospital  which is how he wound up with [name]. Prior to coming to the hospital he had not been seeing anyone specifically for depression although he had been taking some medicines that could be helping with depression symptoms  Patient is married. He is retired. Has extended family in adult children who live nearby. Recently some stress within the family related to the death of the patient's mother. Patient has chronic pain condition including back pain andperipheral neuropathy. Denies any substance abuse actively Associated Signs/Symptoms: Depression Symptoms:  depressed mood, anhedonia, psychomotor retardation, feelings of worthlessness/guilt, difficulty concentrating, hopelessness, suicidal thoughts with specific plan, (Hypo) Manic Symptoms:  none Anxiety Symptoms:  Excessive Worry, Psychotic Symptoms:  patient says he thinks he sometimes has visual hallucinations but what he describes are very trivial and are probably just the result of sleep problems PTSD Symptoms: Negative Total Time spent with patient: 1 hour  Past Psychiatric History: patient reports he had never seen a psychiatrist before and never had any counseling. No past suicide attempts no past hospitalizatin. Nevertheless he was taking medicines that are used in psychiatry. He was on 75 mg of amitriptyline at night for chronic pain and sleep. He has a past history of a trial of Cymbalta for his chronic pain and recently had been taking a low dose of Wellbutrin.  Past Medical History:  Past Medical History:  Diagnosis Date  . Hypertension   . Neuropathy    History reviewed. No pertinent surgical history.  Family History: History reviewed. No pertinent family history.  Family Psychiatric  History: Several members of the family has committed suicide including on call and nephews. His mother  suffer from depression  Social History:  History  Alcohol Use No     History  Drug Use No    Social History   Social History  . Marital  status: Married    Spouse name: N/A  . Number of children: N/A  . Years of education: N/A   Social History Main Topics  . Smoking status: Never Smoker  . Smokeless tobacco: Never Used  . Alcohol use No  . Drug use: No  . Sexual activity: Not Asked   Other Topics Concern  . None   Social History Narrative  . None    Hospital Course:    Major depressive disorder with psychosis: Continue Cymbalta  60 mg a day. For insomnia, chronic pain he will be continued on amitriptyline 50 mg a day. He feels that his gait is less unsteady. (higher dose of amitriptyline caused orthostatic hypotension and unsteady gait)  Psychosis: started on risperdal 1mg  qhs--- less worried and less anxious  GAD/insomnia: Patient is doing better on clonazepam 0.5 mg by mouth daily at bedtime. He has been is sleeping better and seems less anxious  Cognitive problems: psychological assessment completed yesterday "Overall, this evaluation suggests the presence of a cognitive deficit. Given the recent loss of [name] mother, his level of grief and loss may have contributed to the performance seen today. It is also possible that his cognitive processes are deteriorating give difficulties prior to his mother's death. Retesting for the presence of cognitive deficits is recommended after 6 months to a year if memory, anxiety and concentration difficulties remain an issue."  Chronic pain patient will be continued on: Norco 3 times a day, Lyrica 200 mg 3 times a day and Flexeril 10 mg daily at bedtime.  Hypertension he'll be continued on Norvasc 10 mg a day and lisinopril 40 mg a day. Hydrochlorothiazide has been added 25 mg a day.  Hypokalemia continue K-Dur 20 milliequivalents twice a day---K is wnl  Dyslipidemia continue Pravachol 20 mg a day  Constipation continue MiraLAX by mouth daily  Dry mouth continue by attending mouthwash   Labs: BMP wnl, ammonia, TSH, B12, HIV, RPR neg/WNL  Head CT:  Moderate chronic microvascular ischemic type changes in the cerebral white matter.  EKG: Within normal limits    Collateral obtained from wife on 8/21: Spoke with the patient's wife today. She reports that he has always have depression however this year due to depression seems to have worsened especially after the death of his mother,however he was struggling with mood even before she passed away.  Wife reports that he does not want to engage in any special activities like going out on vacation or even going out to eat. He is overly concerned with their finances thinking that they have no money. He also has been talking that the family isgoing to placehim in an assisted living facility. He has been thinking that the wife is planning to leave him.  Wife says that for the last 5 years she has not is that he will incorporate all the people stories into his life. For example he had talked about having an argument with his wife about something that'll never happen or he was talking about him having a lemon tree in their property, which he never had and the wife knowsfor a factthat what he was talking is something they heard from one of their friends--this could be confabulation.  He told his wife yesterday that he was having very dark and gross thoughts.  Based on this information I will change the patient's diagnosis to major depressive disorder with psychotic features. I also will add the diagnosis of generalized anxiety disorder as the wife describes a pattern of excessive worrying---almost obsessive.  Upon discharge the patient reports doing much better. He has been sleeping well for the last 3 nights. He denies hopelessness, helplessness, suicidality or homicidality. He denies having auditory or visual hallucinations. He denies having any side effects effects from medications or any physical complaints other than his chronic pain.  Patient's eye contact and thought process have  improved with medications. His responses are not as delayed. Patient has been eating well, has been is sleeping well and has been attending groups.  I have spoken with the patient's wife who reports no concerns about discharge. All guns have been removed from the home.  Staff working with the patient has no concerns about his safety or the safety of others upon his discharge.  Physical Findings: AIMS: Facial and Oral Movements Muscles of Facial Expression: None, normal Lips and Perioral Area: None, normal Jaw: None, normal Tongue: None, normal,Extremity Movements Upper (arms, wrists, hands, fingers): None, normal Lower (legs, knees, ankles, toes): None, normal, Trunk Movements Neck, shoulders, hips: None, normal, Overall Severity Severity of abnormal movements (highest score from questions above): None, normal Patient's awareness of abnormal movements (rate only patient's report): No Awareness, Dental Status Current problems with teeth and/or dentures?: No Does patient usually wear dentures?: No  CIWA:  CIWA-Ar Total: 0 COWS:  COWS Total Score: 2  Musculoskeletal: Strength & Muscle Tone: within normal limits Gait & Station: normal Patient leans: N/A  Psychiatric Specialty Exam: Physical Exam  Constitutional: He is oriented to person, place, and time. He appears well-developed and well-nourished.  HENT:  Head: Normocephalic and atraumatic.  Eyes: Conjunctivae and EOM are normal.  Neck: Normal range of motion.  Respiratory: Effort normal.  Musculoskeletal: Normal range of motion.  Neurological: He is alert and oriented to person, place, and time.    Review of Systems  Constitutional: Negative.   HENT: Negative.   Eyes: Negative.   Respiratory: Negative.   Cardiovascular: Negative.   Gastrointestinal: Negative.   Genitourinary: Negative.   Musculoskeletal: Positive for back pain.  Skin: Negative.   Neurological: Negative.   Endo/Heme/Allergies: Negative.    Psychiatric/Behavioral: Positive for depression. Negative for hallucinations, memory loss, substance abuse and suicidal ideas. The patient is not nervous/anxious and does not have insomnia.     Blood pressure (!) 176/110, pulse 71, temperature 97.9 F (36.6 C), temperature source Oral, resp. rate 19, height 6\' 1"  (1.854 m), weight 99.5 kg (219 lb 6.4 oz), SpO2 99 %.Body mass index is 28.95 kg/m.  General Appearance: Well Groomed  Eye Contact:  Good  Speech:  Clear and Coherent  Volume:  Normal  Mood:  Dysphoric  Affect:  Appropriate and Congruent  Thought Process:  Linear and Descriptions of Associations: Intact  Orientation:  Full (Time, Place, and Person)  Thought Content:  Hallucinations: None  Suicidal Thoughts:  No  Homicidal Thoughts:  No  Memory:  Immediate;   Fair Recent;   Fair Remote;   Fair  Judgement:  Fair  Insight:  Fair  Psychomotor Activity:  Decreased  Concentration:  Concentration: Fair and Attention Span: Fair  Recall:  [employer] of Knowledge:  Fair  Language:  Good  Akathisia:  No  Handed:    AIMS (if indicated):     Assets:  Communication Skills Social Support  ADL's:  Intact  Cognition:  Impaired,  Mild  Sleep:  Number of Hours: 7     Have you used any form of tobacco in the last 30 days? (Cigarettes, Smokeless Tobacco, Cigars, and/or Pipes): No  Has this patient used any form of tobacco in the last 30 days? (Cigarettes, Smokeless Tobacco, Cigars, and/or Pipes) Yes, No  Blood Alcohol level:  Lab Results  Component Value Date   ETH <5 85/46/2703    Metabolic Disorder Labs:  Lab Results  Component Value Date   HGBA1C 5.5 06/20/2017   MPG 111.15 06/20/2017   No results found for: PROLACTIN Lab Results  Component Value Date   CHOL 140 06/20/2017   TRIG 106 06/20/2017   HDL 29 (L) 06/20/2017   CHOLHDL 4.8 06/20/2017   VLDL 21 06/20/2017   [place] 90 06/20/2017   Results for KAYCE, BETTY (MRN [id number]) as of 06/26/2017 11:35  Ref.  Range 06/18/2017 17:52 06/20/2017 07:56 06/22/2017 99:37  BASIC METABOLIC PANEL Unknown   Rpt (A)  COMPREHENSIVE METABOLIC PANEL Unknown Rpt (A)    Sodium Latest Ref Range: 135 - 145 mmol/L 139  141  Potassium Latest Ref Range: 3.5 - 5.1 mmol/L 3.5  4.2  Chloride Latest Ref Range: 101 - 111 mmol/L 100 (L)  103  CO2 Latest Ref Range: 22 - 32 mmol/L 29  29  Glucose Latest Ref Range: 65 - 99 mg/dL 130 (H)  69  Mean Plasma Glucose Latest Units: mg/dL  111.15   BUN Latest Ref Range: 6 - 20 mg/dL 12  27 (H)  Creatinine Latest Ref Range: 0.61 - 1.24 mg/dL 1.26 (H)  1.19  Calcium Latest Ref Range: 8.9 - 10.3 mg/dL 9.5  9.6  Anion gap Latest Ref Range: 5 - 15  10  9   Alkaline Phosphatase Latest Ref Range: 38 - 126 U/L 100    Albumin Latest Ref Range: 3.5 - 5.0 g/dL 4.5    AST Latest Ref Range: 15 - 41 U/L 19    ALT Latest Ref Range: 17 - 63 U/L 19    Total Protein Latest Ref Range: 6.5 - 8.1 g/dL 8.2 (H)    Ammonia Latest Ref Range: 9 - 35 umol/L   31  Total Bilirubin Latest Ref Range: 0.3 - 1.2 mg/dL 1.1    GFR, Est African American Latest Ref Range: >60 mL/min >60  >60  GFR, Est Non African American Latest Ref Range: >60 mL/min 58 (L)  >60  Total CHOL/HDL Ratio Latest Units: RATIO  4.8   Cholesterol Latest Ref Range: 0 - 200 mg/dL  140   HDL Cholesterol Latest Ref Range: >40 mg/dL  29 (L)   LDL (calc) Latest Ref Range: 0 - 99 mg/dL  90   Triglycerides Latest Ref Range: <150 mg/dL  106   VLDL Latest Ref Range: 0 - 40 mg/dL  21   Vitamin B12 Latest Ref Range: 180 - 914 pg/mL   472  WBC Latest Ref Range: 4.0 - 10.5 K/uL 10.6 (H)    RBC Latest Ref Range: 4.22 - 5.81 MIL/uL 5.16    Hemoglobin Latest Ref Range: 13.0 - 17.0 g/dL 15.4    HCT Latest Ref Range: 39.0 - 52.0 % 44.8    MCV Latest Ref Range: 78.0 - 100.0 fL 86.8    MCH Latest Ref Range: 26.0 - 34.0 pg 29.8    MCHC Latest Ref Range: 30.0 - 36.0 g/dL 34.4    RDW Latest Ref Range: 11.5 - 15.5 % 13.5  Platelets Latest Ref Range: 150 - 400  K/uL 244    Acetaminophen (Tylenol), S Latest Ref Range: 10 - 30 ug/mL <16 (L)    Salicylate Lvl Latest Ref Range: 2.8 - 30.0 mg/dL <7.0    Hemoglobin A1C Latest Ref Range: 4.8 - 5.6 %  5.5   TSH Latest Ref Range: 0.350 - 4.500 uIU/mL  0.830    CLINICAL DATA:  Severe memory deficit.  Confusion.  EXAM: CT HEAD WITHOUT AND WITH CONTRAST  TECHNIQUE: Contiguous axial images were obtained from the base of the skull through the vertex without and with intravenous contrast  CONTRAST:  52mL ISOVUE-300 IOPAMIDOL (ISOVUE-300) INJECTION 61%  COMPARISON:  None.  FINDINGS: Brain: No evidence of acute infarction, hemorrhage, hydrocephalus, extra-axial collection or mass lesion/mass effect. Patchy low-density in the cerebral white matter, usually chronic small vessel ischemia. Patient has history of hypertension. No abnormal intracranial enhancement. No significant or focal atrophy.  Vascular: Major vessels are patent. Somewhat ectatic and elongated basilar, also correlating with hypertension history. Atherosclerotic calcifications.  Skull: No acute or aggressive finding.  Sinuses/Orbits: Mild to moderate anterior ethmoid and inferior frontal mucosal thickening.  IMPRESSION: 1. No acute or reversible finding. No specific explanation for memory loss. 2. Moderate chronic microvascular ischemic type changes in the cerebral white matter.  See Psychiatric Specialty Exam and Suicide Risk Assessment completed by Attending Physician prior to discharge.  Discharge destination:  Home  Is patient on multiple antipsychotic therapies at discharge:  No   Has Patient had three or more failed trials of antipsychotic monotherapy by history:  No  Recommended Plan for Multiple Antipsychotic Therapies: NA   Allergies as of 06/26/2017   No Known Allergies     Medication List    STOP taking these medications   ALPRAZolam 0.25 MG tablet Commonly known as:  XANAX   B-complex with  vitamin C tablet   buPROPion 150 MG 24 hr tablet Commonly known as:  WELLBUTRIN XL   hydroxypropyl methylcellulose / hypromellose 2.5 % ophthalmic solution Commonly known as:  ISOPTO TEARS / GONIOVISC   multivitamin with minerals Tabs tablet     TAKE these medications     Indication  amitriptyline 50 MG tablet Commonly known as:  ELAVIL Take 1 tablet (50 mg total) by mouth at bedtime. What changed:  medication strength  how much to take  Indication:  insomnia/pain   amLODipine 10 MG tablet Commonly known as:  NORVASC Take 10 mg by mouth daily.  Indication:  High Blood Pressure Disorder   clonazePAM 0.5 MG tablet Commonly known as:  KLONOPIN Take 1 tablet (0.5 mg total) by mouth at bedtime.  Indication:  insomnia   cyclobenzaprine 10 MG tablet Commonly known as:  FLEXERIL Take 10 mg by mouth at bedtime.  Indication:  Muscle Spasm   DULoxetine 60 MG capsule Commonly known as:  CYMBALTA Take 1 capsule (60 mg total) by mouth daily.  Indication:  Major Depressive Disorder   hydrochlorothiazide 25 MG tablet Commonly known as:  HYDRODIURIL Take 1 tablet (25 mg total) by mouth daily.  Indication:  High Blood Pressure Disorder   HYDROcodone-acetaminophen 7.5-325 MG tablet Commonly known as:  NORCO Take 1 tablet by mouth 3 (three) times daily.  Indication:  Moderate to Moderately Severe Pain   lisinopril 40 MG tablet Commonly known as:  PRINIVIL,ZESTRIL Take 40 mg by mouth daily.  Indication:  High Blood Pressure Disorder   potassium chloride SA 20 MEQ tablet Commonly known as:  K-DUR,KLOR-CON Take 20 mEq  by mouth 2 (two) times daily.  Indication:  Low Amount of Potassium in the Blood   pravastatin 20 MG tablet Commonly known as:  PRAVACHOL Take 20 mg by mouth daily.  Indication:  High Amount of Fats in the Blood   pregabalin 200 MG capsule Commonly known as:  LYRICA Take 1 capsule (200 mg total) by mouth 3 (three) times daily. What changed:  medication  strength  how much to take  Indication:  Neuropathic Pain   risperiDONE 1 MG tablet Commonly known as:  RISPERDAL Take 1 tablet (1 mg total) by mouth at bedtime.  Indication:  Major Depressive Disorder, Psychosis      Follow-up Information    Ronita Hipps, MD. Go on 07/03/2017.   Specialty:  Family Medicine Why:  Your appointment is at 11:30. Please bring your insurance card and discharge papaerwork with you. Contact information: [place]. Go on 06/26/2017.   Why:  Please follow-up with [facility] for your initial therapy session on August 24th at [place]. Please bring discharge paperwork to this appointment. If you have questions or concerns, contact office directly. Contact information: Address: [address] [place], [place] [address] Phone: [phone] Fax: [phone]       [place]. Go on 08/24/2017.   Why:  Please follow-up with Dr. Daron Offer on October 22nd at [facility] for medication management. Contact information: [address] [place], [place] [address] Phone: [phone] Fax: [phone]       [facility] [place] Go on 06/29/2017.   Why:  Please follow-up with [facility] on August 27th at [facility] for your intake assessment. If you have questions or concerns, contact office directly.  Contact information: [place] [address] [phone]          >30 minutes. >50 % of the time was spent in coordination of care.  Signed: Hildred Priest, MD 06/26/2017, 11:38 AM
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
## 2017-06-26 NOTE — Progress Notes (Signed)
  [facility] Adult Case Management Discharge Plan :  Will you be returning to the same living situation after discharge:  Yes,  returning home. At discharge, do you have transportation home?: Yes,  wife picked patient up. Do you have the ability to pay for your medications: Yes,  [employer] Medicare.  Release of information consent forms completed and in the chart;  Patient's signature needed at discharge.  Patient to Follow up at: Follow-up Information    Ronita Hipps, MD. Go on 07/03/2017.   Specialty:  Family Medicine Why:  Your appointment is at 11:30. Please bring your insurance card and discharge papaerwork with you. Contact information: Portal. Go on 06/26/2017.   Why:  Please follow-up with [facility] for your initial therapy session on August 24th at [place]. Please bring discharge paperwork to this appointment. If you have questions or concerns, contact office directly. Contact information: Address: [address] [place], [place] [address] Phone: [phone] Fax: [phone]       [place]. Go on 08/24/2017.   Why:  Please follow-up with Dr. Daron Offer on October 22nd at [facility] for medication management. Contact information: [address] [place], [place] [address] Phone: [phone] Fax: [phone]       [facility]. Go on 06/29/2017.   Why:  Please follow-up with [facility] on August 27th at [facility] for your intake assessment. If you have questions or concerns, contact office directly.  Contact information: [place] [address] [phone]           Next level of care provider has access to [place] and Suicide Prevention discussed: Yes,  SPE completed with patient and wife.  Have you used any form of tobacco in the last 30 days? (Cigarettes, Smokeless Tobacco, Cigars, and/or Pipes):  No  Has patient been referred to the Quitline?: N/A patient is not a smoker  Patient has been referred for addiction treatment: [facility], [place] 06/26/2017, 9:23 AM

## 2017-06-26 NOTE — Plan of Care (Signed)
Problem: [facility] Participation in Recreation Therapeutic Interventions Goal: STG-Patient will demonstrate improved self esteem by identif STG: Self-Esteem - Within 4 treatment sessions, patient will verbalize at least 5 positive affirmation statements in each of 2 treatment sessions to increase self-esteem.  Outcome: Completed/Met Date Met: 06/26/17 Treatment Session 2; Completed 2 out of 2: At approximately 8:45 am, LRT met with patient in patient room. Patient verbalized 5 positive affirmation statements. Patient reported it felt "pretty good". LRT encouraged patient to continue saying positive affirmation statements.  Leonette Monarch, LRT/CTRS 08.24.18 2:19 pm Goal: STG-Other Recreation Therapy Goal (Specify) STG: Stress Management - Within 4 treatment sessions, patient will verbalize understanding of the stress management techniques in each of 2 treatment sessions to increase stress management skills.  Outcome: Completed/Met Date Met: 06/26/17 Treatment Session 2; Completed 2 out of 2: At approximately 8:45 am, LRT met with patient in patient room. Patient reported he read over the stress management techniques. Patient verbalized understanding. LRT encouraged patient to practice the stress management techniques.  Leonette Monarch, LRT/CTRS 08.24.18 2:20 pm

## 2017-06-26 NOTE — Progress Notes (Signed)
Recreation Therapy Notes  INPATIENT RECREATION TR PLAN  Patient Details Name: Joel Gross MRN: [id number] DOB: Dec 22, 1950 Today's Date: 06/26/2017  Rec Therapy Plan Is patient appropriate for Therapeutic Recreation?: Yes Treatment times per week: At least once a week TR Treatment/Interventions: 1:1 session, Group participation (Comment) (Appropriate participation in daily recreational therapy tx)  Discharge Criteria Pt will be discharged from therapy if:: Treatment goals are met, Discharged Treatment plan/goals/alternatives discussed and agreed upon by:: Patient/family  Discharge Summary Short term goals set: See Care Plan Short term goals met: Complete Progress toward goals comments: One-to-one attended Which groups?: Self-esteem, Leisure education, Other (Comment) (Self-expression) One-to-one attended: Self-esteem, stress management Reason goals not met: N/A Therapeutic equipment acquired: None Reason patient discharged from therapy: Discharge from hospital Pt/family agrees with progress & goals achieved: Yes Date patient discharged from therapy: 06/26/17   Leonette Monarch, LRT/CTRS 06/26/2017, 2:22 PM

## 2017-07-01 ENCOUNTER — Other Ambulatory Visit (HOSPITAL_COMMUNITY): Payer: Medicare HMO | Admitting: Psychiatry

## 2017-07-02 ENCOUNTER — Other Ambulatory Visit (HOSPITAL_COMMUNITY): Payer: Medicare HMO

## 2017-07-03 ENCOUNTER — Other Ambulatory Visit (HOSPITAL_COMMUNITY): Payer: Medicare HMO

## 2017-07-03 DIAGNOSIS — I10 Essential (primary) hypertension: Secondary | ICD-10-CM | POA: Diagnosis not present

## 2017-07-03 DIAGNOSIS — Z6829 Body mass index (BMI) 29.0-29.9, adult: Secondary | ICD-10-CM | POA: Diagnosis not present

## 2017-07-03 DIAGNOSIS — R69 Illness, unspecified: Secondary | ICD-10-CM | POA: Diagnosis not present

## 2017-07-07 ENCOUNTER — Other Ambulatory Visit (HOSPITAL_COMMUNITY): Payer: Medicare HMO

## 2017-07-08 ENCOUNTER — Other Ambulatory Visit (HOSPITAL_COMMUNITY): Payer: Medicare HMO

## 2017-07-08 DIAGNOSIS — M5090 Cervical disc disorder, unspecified, unspecified cervical region: Secondary | ICD-10-CM | POA: Diagnosis not present

## 2017-07-08 DIAGNOSIS — G629 Polyneuropathy, unspecified: Secondary | ICD-10-CM | POA: Diagnosis not present

## 2017-07-08 DIAGNOSIS — G894 Chronic pain syndrome: Secondary | ICD-10-CM | POA: Diagnosis not present

## 2017-07-08 DIAGNOSIS — Z79891 Long term (current) use of opiate analgesic: Secondary | ICD-10-CM | POA: Diagnosis not present

## 2017-07-08 DIAGNOSIS — R69 Illness, unspecified: Secondary | ICD-10-CM | POA: Diagnosis not present

## 2017-07-08 DIAGNOSIS — F1120 Opioid dependence, uncomplicated: Secondary | ICD-10-CM | POA: Diagnosis not present

## 2017-07-09 ENCOUNTER — Other Ambulatory Visit (HOSPITAL_COMMUNITY): Payer: Medicare HMO

## 2017-07-10 ENCOUNTER — Other Ambulatory Visit (HOSPITAL_COMMUNITY): Payer: Medicare HMO

## 2017-07-13 ENCOUNTER — Other Ambulatory Visit (HOSPITAL_COMMUNITY): Payer: Medicare HMO

## 2017-07-14 ENCOUNTER — Other Ambulatory Visit (HOSPITAL_COMMUNITY): Payer: Self-pay | Admitting: Psychiatry

## 2017-07-14 ENCOUNTER — Other Ambulatory Visit (HOSPITAL_COMMUNITY): Payer: Medicare HMO

## 2017-07-14 ENCOUNTER — Telehealth (HOSPITAL_COMMUNITY): Payer: Self-pay | Admitting: Psychiatry

## 2017-07-14 MED ORDER — CLONAZEPAM 0.5 MG PO TABS: 0.5000 mg | ORAL_TABLET | Freq: Every day | ORAL | 1 refills | Status: DC

## 2017-07-14 MED ORDER — DULOXETINE HCL 60 MG PO CPEP: 60.0000 mg | ORAL_CAPSULE | Freq: Every day | ORAL | 1 refills | Status: DC

## 2017-07-14 MED ORDER — RISPERIDONE 1 MG PO TABS: ORAL_TABLET | ORAL | 1 refills | Status: DC

## 2017-07-14 NOTE — Telephone Encounter (Signed)
Mr Seelman came earlier for evaluation for IOP but that did not seem the best treatment for him so he did not stay.  His wife called for refills as his doctor from [facility] has since left that practice and he has yet to see the new doctor here. I called in refills to [facility] for duloxetine 60 mg, clonazepam 0.5 mg and increased the risperidone to 2 mg hsas we had discussed on his visit.

## 2017-07-15 ENCOUNTER — Other Ambulatory Visit (HOSPITAL_COMMUNITY): Payer: Medicare HMO

## 2017-07-16 ENCOUNTER — Other Ambulatory Visit (HOSPITAL_COMMUNITY): Payer: Medicare HMO

## 2017-07-17 ENCOUNTER — Other Ambulatory Visit (HOSPITAL_COMMUNITY): Payer: Medicare HMO

## 2017-07-20 ENCOUNTER — Other Ambulatory Visit (HOSPITAL_COMMUNITY): Payer: Medicare HMO

## 2017-07-21 ENCOUNTER — Other Ambulatory Visit (HOSPITAL_COMMUNITY): Payer: Medicare HMO

## 2017-07-22 ENCOUNTER — Other Ambulatory Visit (HOSPITAL_COMMUNITY): Payer: Medicare HMO

## 2017-07-23 ENCOUNTER — Other Ambulatory Visit (HOSPITAL_COMMUNITY): Payer: Medicare HMO

## 2017-07-23 ENCOUNTER — Telehealth (HOSPITAL_COMMUNITY): Payer: Self-pay | Admitting: *Deleted

## 2017-07-24 ENCOUNTER — Other Ambulatory Visit (HOSPITAL_COMMUNITY): Payer: Medicare HMO

## 2017-07-27 ENCOUNTER — Other Ambulatory Visit (HOSPITAL_COMMUNITY): Payer: Medicare HMO

## 2017-07-28 ENCOUNTER — Other Ambulatory Visit (HOSPITAL_COMMUNITY): Payer: Medicare HMO

## 2017-07-29 ENCOUNTER — Other Ambulatory Visit (HOSPITAL_COMMUNITY): Payer: Medicare HMO

## 2017-07-30 ENCOUNTER — Other Ambulatory Visit (HOSPITAL_COMMUNITY): Payer: Medicare HMO

## 2017-07-31 ENCOUNTER — Other Ambulatory Visit (HOSPITAL_COMMUNITY): Payer: Medicare HMO

## 2017-08-03 ENCOUNTER — Other Ambulatory Visit (HOSPITAL_COMMUNITY): Payer: Medicare HMO

## 2017-08-03 ENCOUNTER — Ambulatory Visit (HOSPITAL_COMMUNITY): Payer: Self-pay | Admitting: Licensed Clinical Social Worker

## 2017-08-04 ENCOUNTER — Other Ambulatory Visit (HOSPITAL_COMMUNITY): Payer: Medicare HMO

## 2017-08-04 DIAGNOSIS — M5090 Cervical disc disorder, unspecified, unspecified cervical region: Secondary | ICD-10-CM | POA: Diagnosis not present

## 2017-08-04 DIAGNOSIS — G629 Polyneuropathy, unspecified: Secondary | ICD-10-CM | POA: Diagnosis not present

## 2017-08-04 DIAGNOSIS — Z79891 Long term (current) use of opiate analgesic: Secondary | ICD-10-CM | POA: Diagnosis not present

## 2017-08-04 DIAGNOSIS — G894 Chronic pain syndrome: Secondary | ICD-10-CM | POA: Diagnosis not present

## 2017-08-04 DIAGNOSIS — R69 Illness, unspecified: Secondary | ICD-10-CM | POA: Diagnosis not present

## 2017-08-05 ENCOUNTER — Other Ambulatory Visit (HOSPITAL_COMMUNITY): Payer: Medicare HMO

## 2017-08-06 ENCOUNTER — Other Ambulatory Visit (HOSPITAL_COMMUNITY): Payer: Medicare HMO

## 2017-08-07 ENCOUNTER — Other Ambulatory Visit (HOSPITAL_COMMUNITY): Payer: Medicare HMO

## 2017-08-07 DIAGNOSIS — L02512 Cutaneous abscess of left hand: Secondary | ICD-10-CM | POA: Diagnosis not present

## 2017-08-07 DIAGNOSIS — L03012 Cellulitis of left finger: Secondary | ICD-10-CM | POA: Diagnosis not present

## 2017-08-10 ENCOUNTER — Other Ambulatory Visit (HOSPITAL_COMMUNITY): Payer: Medicare HMO

## 2017-08-11 ENCOUNTER — Other Ambulatory Visit (HOSPITAL_COMMUNITY): Payer: Medicare HMO

## 2017-08-11 ENCOUNTER — Ambulatory Visit (HOSPITAL_COMMUNITY): Payer: Self-pay | Admitting: Licensed Clinical Social Worker

## 2017-08-12 ENCOUNTER — Other Ambulatory Visit (HOSPITAL_COMMUNITY): Payer: Medicare HMO

## 2017-08-13 ENCOUNTER — Other Ambulatory Visit (HOSPITAL_COMMUNITY): Payer: Medicare HMO

## 2017-08-14 ENCOUNTER — Other Ambulatory Visit (HOSPITAL_COMMUNITY): Payer: Medicare HMO

## 2017-08-17 ENCOUNTER — Other Ambulatory Visit (HOSPITAL_COMMUNITY): Payer: Medicare HMO

## 2017-08-18 ENCOUNTER — Other Ambulatory Visit (HOSPITAL_COMMUNITY): Payer: Medicare HMO

## 2017-08-19 ENCOUNTER — Other Ambulatory Visit (HOSPITAL_COMMUNITY): Payer: Medicare HMO

## 2017-08-20 ENCOUNTER — Other Ambulatory Visit (HOSPITAL_COMMUNITY): Payer: Medicare HMO

## 2017-08-21 ENCOUNTER — Other Ambulatory Visit (HOSPITAL_COMMUNITY): Payer: Medicare HMO

## 2017-08-24 ENCOUNTER — Encounter (HOSPITAL_COMMUNITY): Payer: Self-pay | Admitting: Psychiatry

## 2017-08-24 ENCOUNTER — Ambulatory Visit (INDEPENDENT_AMBULATORY_CARE_PROVIDER_SITE_OTHER): Payer: Medicare HMO | Admitting: Psychiatry

## 2017-08-24 ENCOUNTER — Other Ambulatory Visit (HOSPITAL_COMMUNITY): Payer: Medicare HMO

## 2017-08-24 VITALS — BP 182/98 | HR 87 | Ht 73.0 in | Wt 234.8 lb

## 2017-08-24 DIAGNOSIS — G4710 Hypersomnia, unspecified: Secondary | ICD-10-CM

## 2017-08-24 DIAGNOSIS — F410 Panic disorder [episodic paroxysmal anxiety] without agoraphobia: Secondary | ICD-10-CM | POA: Diagnosis not present

## 2017-08-24 DIAGNOSIS — M2550 Pain in unspecified joint: Secondary | ICD-10-CM | POA: Diagnosis not present

## 2017-08-24 DIAGNOSIS — R4584 Anhedonia: Secondary | ICD-10-CM | POA: Diagnosis not present

## 2017-08-24 DIAGNOSIS — F22 Delusional disorders: Secondary | ICD-10-CM

## 2017-08-24 DIAGNOSIS — R4582 Worries: Secondary | ICD-10-CM | POA: Diagnosis not present

## 2017-08-24 DIAGNOSIS — M791 Myalgia, unspecified site: Secondary | ICD-10-CM

## 2017-08-24 DIAGNOSIS — R5383 Other fatigue: Secondary | ICD-10-CM

## 2017-08-24 DIAGNOSIS — R45851 Suicidal ideations: Secondary | ICD-10-CM

## 2017-08-24 DIAGNOSIS — R450 Nervousness: Secondary | ICD-10-CM

## 2017-08-24 DIAGNOSIS — R69 Illness, unspecified: Secondary | ICD-10-CM | POA: Diagnosis not present

## 2017-08-24 DIAGNOSIS — F333 Major depressive disorder, recurrent, severe with psychotic symptoms: Secondary | ICD-10-CM

## 2017-08-24 DIAGNOSIS — F411 Generalized anxiety disorder: Secondary | ICD-10-CM

## 2017-08-24 DIAGNOSIS — G894 Chronic pain syndrome: Secondary | ICD-10-CM

## 2017-08-24 DIAGNOSIS — M549 Dorsalgia, unspecified: Secondary | ICD-10-CM | POA: Diagnosis not present

## 2017-08-24 DIAGNOSIS — Z818 Family history of other mental and behavioral disorders: Secondary | ICD-10-CM

## 2017-08-24 MED ORDER — HYDROXYZINE PAMOATE 25 MG PO CAPS: 25.0000 mg | ORAL_CAPSULE | Freq: Every day | ORAL | 0 refills | Status: DC | PRN

## 2017-08-24 MED ORDER — DULOXETINE HCL 40 MG PO CPEP: 80.0000 mg | ORAL_CAPSULE | Freq: Every day | ORAL | 1 refills | Status: DC

## 2017-08-24 MED ORDER — RISPERIDONE 2 MG PO TABS: 2.0000 mg | ORAL_TABLET | Freq: Every day | ORAL | 1 refills | Status: DC

## 2017-08-24 NOTE — Patient Instructions (Addendum)
   Tardive Dyskinesia Tardive dyskinesia is a disorder that causes uncontrollable body movements. It occurs in some people who are taking a neuroleptic medicine or have taken this type of medicine in the past. These medicines block the effects of the brain chemical dopamine. In some cases, tardive dyskinesia starts months or years after taking the medicine. Not everyone who takes a neuroleptic medicine will get tardive dyskinesia. What are the causes? Tardive dyskinesia is caused by changes in your brain associated with taking a neuroleptic medicine. What increases the risk? If you are taking one of these medicines, your risk of tardive dyskinesia may be higher if you:  Are taking an older type of neuroleptic medicine.  Have been taking the medicine for a long time at a high dose.  Are a woman past the age of menopause.  Are older than 60.  Have a history of alcohol or drug abuse.  What are the signs or symptoms? Abnormal, uncontrollable movements are the main symptom of tardive dyskinesia. These types of movements may include:  Grimacing.  Sticking out or twisting your tongue.  Making chewing or sucking sounds.  Making grunting or sighing noises.  Blinking your eyes.  Twisting, swaying, or thrusting your body.  Foot tapping or finger waving.  Rapid movements of your arms or legs.  How is this diagnosed? Your health care provider may suspect that you have tardive dyskinesia if:  You have been taking neuroleptic medicines.  You have uncontrolled abnormal movements.  If you are taking a medicine that can cause tardive dyskinesia, your health care provider may screen you for early signs of the condition. This may include:  Observing your body movements.  Using a specific rating scale called the Abnormal Involuntary Movement Scale (AIMS).  You may also have tests to rule out other conditions that cause abnormal body movements, including:  Parkinson  disease.  Huntington disease.  Stroke.  How is this treated? The best treatment for tardive dyskinesia is to lower the dosage of your medicine or switch to a different medicine at the first sign of abnormal and uncontrolled movements. There is no cure for long-term (chronic) tardive dyskinesia. Some medicines may help control the movements. These include:  Clozapine, a medicine used to treat mental illness (antipsychotic).  Some muscle relaxants.  Some antiseizure medicines.  Some medicines used to treat high blood pressure.  Some tranquilizers (sedatives).  Follow these instructions at home:  Take medicines only as directed by your health care provider. Do not stop or start taking any medicines without talking to your health care provider first.  Do not abuse drugs or alcohol.  Keep all follow-up visits as directed by your health care provider. This is important. Contact a health care provider if:  You are unable to eat or drink.  You have had a fall.  Your symptoms worsen. This information is not intended to replace advice given to you by your health care provider. Make sure you discuss any questions you have with your health care provider. Document Released: 10/10/2002 Document Revised: 06/19/2016 Document Reviewed: 12/23/2013 Elsevier Interactive Patient Education  [employer].

## 2017-08-24 NOTE — Progress Notes (Signed)
Psychiatric Initial Adult Assessment   Patient Identification: Joel Gross MRN:  [id number] Date of Evaluation:  08/24/2017 Referral Source: [facility] discharged Chief Complaint:  Depressed, anhedonia Chief Complaint    Panic Attack; Depression     Visit Diagnosis:    ICD-10-CM   1. 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 Chronic pain syndrome G89.4   3. GAD (generalized anxiety disorder) F41.1   4. MDD (major depressive disorder), recurrent, severe, with psychosis ([place]) F33.3     History of Present Illness:  Joel Gross is a 66 year old male with a history of severe major depressive disorder, with a recent psychiatric hospitalization in August 2018 at [facility].  He had initially presented with severe depression in the setting of his mother's recent passing, and severe depressive symptoms, including vague visual hallucinations, significant decrease in appetite, excessive sleep, melancholy, and suicidal thoughts with a plan of shooting himself.  He reports that the Cymbalta and risperidone that he was started on the been helpful.  He is currently taking risperidone 2 mg at night, and Cymbalta 60 mg.  He is not engaging in any individual therapy agreeable to referral in this office.  I spent time with him learning about some of his current mood symptoms, ongoing difficulty with enjoying activities, wanting to stay away from others and decreased socialization, poor energy, fatigue, diminished appetite (better than during hospitalization), and ongoing thoughts about death and dying.  He denies any plan to harm himself, because he reports that he knows he needs to be there for his wife and children and grandchildren.  He reports that he also has a strong faith in God and he reads the Bible every morning.  He goes to church nearly every weekend.  He denies any intentions to harm  himself.  He talks about some of the grief he has with not having a job anymore, and being retired.  He used to feel very good about himself and have more self-esteem when he was working, and now he spends most of his days just doing chores, and does not tend to feel too good about himself.  I spent time with him exploring his ideas of how he might be able to increase his activation and get involved in community activities, volunteering, and hobbies.  He has been retired for 5 years, has had significant adjustment difficulties to retirement.  He reports that this is complicated by his chronic pain.  I spent time with him reviewing the risks and benefits of risperidone, and the risk of antipsychotics in the elderly, and the risk of tardive dyskinesia with prolonged use.  We completed an aims in office, he scored a 0.  We agreed continue 2 mg nightly of risperidone, and increase Cymbalta to 80 mg daily.  For acute anxiety or panic, which he has approximately 1-2 times per week, we agreed on Vistaril 25 mg, and I cautioned him against the sedation and dry mouth.  He is agreeable to follow-up in 8 weeks and to establish individual therapy in office.  Associated Signs/Symptoms: Depression Symptoms:  depressed mood, anhedonia, hypersomnia, fatigue, feelings of worthlessness/guilt, difficulty concentrating, recurrent thoughts of death, anxiety, panic attacks, (Hypo) Manic Symptoms:  none Anxiety Symptoms:  Excessive Worry, Psychotic Symptoms:  Paranoia, PTSD Symptoms: Negative  Past Psychiatric History: Psychiatric hospitalization at [facility] for MDD with psychotic features  Previous Psychotropic Medications: Yes  Substance Abuse History in the last 12 months:  No.  Consequences of Substance Abuse: Negative  Past Medical History:  Past Medical History:  Diagnosis Date  . Hypertension   . Neuropathy    History reviewed. No pertinent surgical history.  Family  Psychiatric History: Family history of anxiety and depression in his mom  Family History:  Family History  Problem Relation Age of Onset  . Anxiety disorder Mother   . Depression Mother     Social History:   Social History   Social History  . Marital status: Married    Spouse name: N/A  . Number of children: N/A  . Years of education: N/A   Social History Main Topics  . Smoking status: Never Smoker  . Smokeless tobacco: Never Used  . Alcohol use No  . Drug use: No  . Sexual activity: Yes    Birth control/ protection: None   Other Topics Concern  . None   Social History Narrative  . None    Additional Social History: Retired from working as a Furniture conservator/restorer, married for over 81 years, has 3 children and multiple grandchildren.  He is very close with his family who lives in [place].  He has a dog.  Does not smoke.  Denies any drug use.  Allergies:  No Known Allergies  Metabolic Disorder Labs: Lab Results  Component Value Date   HGBA1C 5.5 06/20/2017   MPG 111.15 06/20/2017   No results found for: PROLACTIN Lab Results  Component Value Date   CHOL 140 06/20/2017   TRIG 106 06/20/2017   HDL 29 (L) 06/20/2017   CHOLHDL 4.8 06/20/2017   VLDL 21 06/20/2017   LDLCALC 90 06/20/2017     Current Medications: Current Outpatient Prescriptions  Medication Sig Dispense Refill  . amitriptyline (ELAVIL) 50 MG tablet Take 1 tablet (50 mg total) by mouth at bedtime. 30 tablet 0  . amLODipine (NORVASC) 10 MG tablet Take 10 mg by mouth daily.    . cyclobenzaprine (FLEXERIL) 10 MG tablet Take 10 mg by mouth at bedtime.    . DULoxetine 40 MG CPEP Take 80 mg by mouth daily. 180 capsule 1  . hydrochlorothiazide (HYDRODIURIL) 25 MG tablet Take 1 tablet (25 mg total) by mouth daily. 30 tablet 0  . HYDROcodone-acetaminophen (NORCO) 7.5-325 MG tablet Take 1 tablet by mouth 3 (three) times daily.    Marland Kitchen lisinopril (PRINIVIL,ZESTRIL) 40 MG tablet Take 40 mg by mouth daily.    . potassium  chloride SA (K-DUR,KLOR-CON) 20 MEQ tablet Take 20 mEq by mouth 2 (two) times daily.    . pravastatin (PRAVACHOL) 20 MG tablet Take 20 mg by mouth daily.    . pregabalin (LYRICA) 200 MG capsule Take 1 capsule (200 mg total) by mouth 3 (three) times daily. 90 capsule 0  . risperiDONE (RISPERDAL) 2 MG tablet Take 1 tablet (2 mg total) by mouth at bedtime. Take one or 2 tablets at bedtime as needed 90 tablet 1  . hydrOXYzine (VISTARIL) 25 MG capsule Take 1 capsule (25 mg total) by mouth daily as needed for anxiety. 30 capsule 0   No current facility-administered medications for this visit.     Neurologic: Headache: Negative Seizure: Negative Paresthesias:Yes  Musculoskeletal: Strength & Muscle Tone: within normal limits Gait & Station: normal Patient leans: N/A  Psychiatric Specialty Exam: Review of Systems  Constitutional: Negative.   HENT: Negative.   Eyes: Negative.   Respiratory: Negative.   Cardiovascular: Negative.   Gastrointestinal: Negative.  Musculoskeletal: Positive for back pain, joint pain and myalgias.  Skin: Negative.   Neurological: Negative.   Psychiatric/Behavioral: Positive for depression. The patient is nervous/anxious.     Blood pressure (!) 182/98, pulse 87, height 6\' 1"  (1.854 m), weight 234 lb 12.8 oz (106.5 kg).Body mass index is 30.98 kg/m.  General Appearance: Casual and Fairly Groomed  Eye Contact:  Fair  Speech:  Clear and Coherent  Volume:  Decreased  Mood:  Depressed and Dysphoric  Affect:  Congruent and Depressed  Thought Process:  Goal Directed and Descriptions of Associations: Intact  Orientation:  Full (Time, Place, and Person)  Thought Content:  Logical  Suicidal Thoughts:  Yes.  without intent/plan  Homicidal Thoughts:  No  Memory:  Immediate;   Fair  Judgement:  Fair  Insight:  Fair  Psychomotor Activity:  Decreased  Concentration:  Concentration: Fair  Recall:  [employer] of Knowledge:Fair  Language: Fair  Akathisia:  Negative   Handed:  Right  AIMS (if indicated):  0 - AIMS completed today  Assets:  Communication Skills Desire for Improvement Financial Resources/Insurance Housing Leisure Time Social Support Transportation  ADL's:  Intact  Cognition: WNL  Sleep:  8-10 hours    Treatment Plan Summary: Joel Gross is a 66 year old male with MDD with psychotic symptoms, recently psychiatrically hospitalized, presents today for an initial intake.  He presents with ongoing anhedonia and dysphoria, melancholic affect, and difficulty with behavioral activation.  He denies any acute suicidal thoughts, but continues to think about death and dying.  He is able to identify multiple protective factors including his family, faith, and sense of responsibility to the young children including grandchildren and great-grandchildren.  He has no prior history of suicide attempts.  We discussed medication management changes as below, and reviewed the risks and benefits of atypical antipsychotic including the risk of tardive dyskinesia.  He presented today with an elevated blood pressure, but did not have any neurologic or cardiopulmonary symptoms, reports that his blood pressure earlier today was normal and he often gets anxious at the doctor's office.  1. Severe recurrent major depression with psychotic features ([place])   2. Chronic pain syndrome   3. GAD (generalized anxiety disorder)   4. MDD (major depressive disorder), recurrent, severe, with psychosis ([place])     Status of current problems: New problems to provider  Labs Ordered: No orders of the defined types were placed in this encounter.   Labs Reviewed: Reviewed the recent laboratory studies from psychiatric hospitalization  Collateral Obtained/Records Reviewed: Reviewed the psychiatric hospitalization record from [facility]:  Increase Cymbalta to 80 mg in the morning Risperidone 2 mg nightly; change the patient to 1 tablet  Vistaril 25  mg once a day as needed for anxiety or panic Continue Elavil qhs for chronic pain/sleep Educated patient on the risk of tardive dyskinesia with antipsychotics Follow-up in 8 weeks Individual therapy referral  I spent 50 minutes with the patient in direct face-to-face clinical care.  Greater than 50% of this time was spent in counseling and coordination of care with the patient.    Aundra Dubin, MD 10/22/20182:19 PM

## 2017-08-25 ENCOUNTER — Other Ambulatory Visit (HOSPITAL_COMMUNITY): Payer: Medicare HMO

## 2017-09-03 DIAGNOSIS — G894 Chronic pain syndrome: Secondary | ICD-10-CM | POA: Diagnosis not present

## 2017-09-03 DIAGNOSIS — R69 Illness, unspecified: Secondary | ICD-10-CM | POA: Diagnosis not present

## 2017-09-03 DIAGNOSIS — G629 Polyneuropathy, unspecified: Secondary | ICD-10-CM | POA: Diagnosis not present

## 2017-09-03 DIAGNOSIS — M5090 Cervical disc disorder, unspecified, unspecified cervical region: Secondary | ICD-10-CM | POA: Diagnosis not present

## 2017-09-03 DIAGNOSIS — Z79891 Long term (current) use of opiate analgesic: Secondary | ICD-10-CM | POA: Diagnosis not present

## 2017-09-17 ENCOUNTER — Telehealth (HOSPITAL_COMMUNITY): Payer: Self-pay

## 2017-09-17 ENCOUNTER — Encounter (HOSPITAL_COMMUNITY): Payer: Self-pay | Admitting: Licensed Clinical Social Worker

## 2017-09-17 ENCOUNTER — Ambulatory Visit (INDEPENDENT_AMBULATORY_CARE_PROVIDER_SITE_OTHER): Payer: Medicare HMO | Admitting: Licensed Clinical Social Worker

## 2017-09-17 DIAGNOSIS — R69 Illness, unspecified: Secondary | ICD-10-CM | POA: Diagnosis not present

## 2017-09-17 DIAGNOSIS — F333 Major depressive disorder, recurrent, severe with psychotic symptoms: Secondary | ICD-10-CM

## 2017-09-17 NOTE — Telephone Encounter (Signed)
I called patient and let him know what the doctor said, patient is in agreement with this plan.

## 2017-09-17 NOTE — Progress Notes (Signed)
   THERAPIST PROGRESS NOTE  Session Time: 10:00am-11:00am  Participation Level: Active  Behavioral Response: Well GroomedAlertDepressed  Type of Therapy: Individual Therapy  Treatment Goals addressed: Anxiety and Diagnosis: Major Depressive Disorder, severe, with psychotic features  Interventions: CBT and Motivational Interviewing  Summary: Joel Gross is a 66 y.o. male who presents with Major Depressive Disorder,severe, with psychotic features and Generalized Anxiety Disorder.   Suicidal/Homicidal: Nowithout intent/plan  Therapist Response: Joel Gross met with clinician for individual therapy. Joel Gross discussed his psychiatric symptoms and current life events. Joel Gross shared  concerns that Vistaril is keying him up, difficulty going to sleep. Joel Gross also rpeorts since his forced retirement due to injury of shoulder, his health and his motivation has gone way down hill. Joel Gross reports his physical health, including neuropathy in his feet, has made his life miserable. He reports feeling good in the mornings, but spending most of his day anticipating pain later in the evenings. Joel Gross reports his relationship with wife is solid and she is "the only joy in his life". Joel Gross reports using meditation to help with pain and mood. However, he identified it only works for a little while. Clinician and Joel Gross worked together to Museum/gallery conservator.   Plan: Return again in 2-3 weeks.  Diagnosis: Axis I:Major Depressive Disorder, severe, with psychotic features and Generalized Anxiety Disorder.  Joel Gross 09/17/2017

## 2017-09-17 NOTE — Telephone Encounter (Signed)
He should stop using the vistaril then - sounds like anticholinergics don't agree with him.  We recently increased cymbalta which should help reduce his need for any anxiety medication anyways.

## 2017-09-17 NOTE — Telephone Encounter (Signed)
Patient came by the office to see Evelena Peat, he stopped by my office because he says his medications are not working correctly. They Hydroxyzine is making him hyper. Please review and advise.

## 2017-10-05 ENCOUNTER — Telehealth (HOSPITAL_COMMUNITY): Payer: Self-pay | Admitting: Psychiatry

## 2017-10-05 ENCOUNTER — Telehealth (HOSPITAL_COMMUNITY): Payer: Self-pay

## 2017-10-05 DIAGNOSIS — M5090 Cervical disc disorder, unspecified, unspecified cervical region: Secondary | ICD-10-CM | POA: Diagnosis not present

## 2017-10-05 DIAGNOSIS — G629 Polyneuropathy, unspecified: Secondary | ICD-10-CM | POA: Diagnosis not present

## 2017-10-05 DIAGNOSIS — Z79891 Long term (current) use of opiate analgesic: Secondary | ICD-10-CM | POA: Diagnosis not present

## 2017-10-05 DIAGNOSIS — R69 Illness, unspecified: Secondary | ICD-10-CM | POA: Diagnosis not present

## 2017-10-05 DIAGNOSIS — G894 Chronic pain syndrome: Secondary | ICD-10-CM | POA: Diagnosis not present

## 2017-10-05 NOTE — Telephone Encounter (Signed)
Patient would like a call back about his medications, patient states he is very depressed and can not get in to see you until 12/20. Please call at [phone]

## 2017-10-05 NOTE — Telephone Encounter (Signed)
Lets get him started in IOP if he is agreable

## 2017-10-05 NOTE — Telephone Encounter (Signed)
Rita, I called patient and he is agreeable to coming to IOP. Please call him and set this up, thank you

## 2017-10-05 NOTE — Telephone Encounter (Signed)
D:  Dr. Daron Offer referred pt to [facility].  A:  Called pt to orient him to [facility].  Pt states he already has upcoming appointments with his therapist Evelena Peat) and Dr. Daron Offer.  "I am not interested in group right now.  I just want to know if I can double up on my Risperdal."  Explained to pt that he's not a current patient of [facility]; so writer redirected pt back to Dr. Joycelyn Schmid office.  Will inform Dr. Daron Offer and [place].  R:  Pt receptive.

## 2017-10-06 ENCOUNTER — Other Ambulatory Visit (HOSPITAL_COMMUNITY): Payer: Self-pay | Admitting: Psychiatry

## 2017-10-06 DIAGNOSIS — F333 Major depressive disorder, recurrent, severe with psychotic symptoms: Secondary | ICD-10-CM

## 2017-10-06 MED ORDER — RISPERIDONE 2 MG PO TABS: 2.0000 mg | ORAL_TABLET | Freq: Two times a day (BID) | ORAL | 1 refills | Status: DC

## 2017-10-06 MED ORDER — AMITRIPTYLINE HCL 50 MG PO TABS: 50.0000 mg | ORAL_TABLET | Freq: Every day | ORAL | 0 refills | Status: DC

## 2017-10-06 NOTE — Progress Notes (Signed)
Patient reports that his mood has been a bit more depressed lately.  No acute safety issues.  He is currently taking risperidone 2 mg at night, we agreed to increase to 2 mg twice daily, continue Cymbalta, continue Elavil.  We will follow-up as scheduled or sooner if needed.

## 2017-10-22 ENCOUNTER — Ambulatory Visit (INDEPENDENT_AMBULATORY_CARE_PROVIDER_SITE_OTHER): Payer: Medicare HMO | Admitting: Psychiatry

## 2017-10-22 ENCOUNTER — Encounter (HOSPITAL_COMMUNITY): Payer: Self-pay | Admitting: Psychiatry

## 2017-10-22 VITALS — BP 128/74 | HR 92 | Ht 73.0 in | Wt 228.2 lb

## 2017-10-22 DIAGNOSIS — F333 Major depressive disorder, recurrent, severe with psychotic symptoms: Secondary | ICD-10-CM

## 2017-10-22 DIAGNOSIS — R69 Illness, unspecified: Secondary | ICD-10-CM | POA: Diagnosis not present

## 2017-10-22 DIAGNOSIS — G212 Secondary parkinsonism due to other external agents: Secondary | ICD-10-CM | POA: Diagnosis not present

## 2017-10-22 DIAGNOSIS — Z79899 Other long term (current) drug therapy: Secondary | ICD-10-CM

## 2017-10-22 DIAGNOSIS — Z818 Family history of other mental and behavioral disorders: Secondary | ICD-10-CM

## 2017-10-22 MED ORDER — DULOXETINE HCL 60 MG PO CPEP: 120.0000 mg | ORAL_CAPSULE | Freq: Every day | ORAL | 1 refills | Status: DC

## 2017-10-22 MED ORDER — QUETIAPINE FUMARATE 100 MG PO TABS: 100.0000 mg | ORAL_TABLET | Freq: Every day | ORAL | 1 refills | Status: DC

## 2017-10-22 NOTE — Progress Notes (Signed)
[facility] MD/PA/NP OP Progress Note  10/22/2017 11:03 AM Joel Gross  MRN:  [id number]  Chief Complaint: No better, feels sluggish HPI: Patient has been taking risperidone 2 mg twice a day along with Cymbalta 80 mg.  Upon bringing him in the room and during her interaction, it is immediately clear that he has some parkinsonism likely secondary to risperidone.  He has a new tremor in both hands, small hand movements, shuffling gait, lower voice.  Wife confirms this change.  I instructed patient to immediately discontinue risperidone, and we will initiate Seroquel to prevent atypical withdrawal, and continue augmentation of antidepressant therapies.  Instructed patient that this may increase his appetite which he was happy to hear given that he struggles with daily nausea and low appetite.  Agreed to start at 100 mg daily, and increase Cymbalta further to 120 mg for pain and ongoing depressive symptoms.  He will be starting weekly therapy in January on a more consistent basis.  We will follow-up in 3 months or sooner if needed.  He has no acute safety issues or suicidality.  Patient and wife notified that I will be out of office for the next 3 weeks and my colleagues at clinic will be covering as I will be on paternity leave.  Visit Diagnosis:    ICD-10-CM   1. 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 Reviewed, with no updates at this time.   Past Medical History:  Past Medical History:  Diagnosis Date  . Hypertension   . Neuropathy    History reviewed. No pertinent surgical history.  Family Psychiatric History: See intake H&P for full details. Reviewed, with no updates at this time.   Family History:  Family History  Problem Relation Age of Onset  . Anxiety  disorder Mother   . Depression Mother     Social History:  Social History   Socioeconomic History  . Marital status: Married    Spouse name: None  . Number of children: None  . Years of education: None  . Highest education level: None  Social Needs  . Financial resource strain: None  . Food insecurity - worry: None  . Food insecurity - inability: None  . Transportation needs - medical: None  . Transportation needs - non-medical: None  Occupational History  . None  Tobacco Use  . Smoking status: Never Smoker  . Smokeless tobacco: Never Used  Substance and Sexual Activity  . Alcohol use: No  . Drug use: No  . Sexual activity: Yes    Birth control/protection: None  Other Topics Concern  . None  Social History Narrative  . None    Allergies:  Allergies  Allergen Reactions  . Risperidone And Related Other (See Comments)    parkinsonism    Metabolic Disorder Labs: Lab Results  Component Value Date   HGBA1C 5.5 06/20/2017   MPG 111.15 06/20/2017   No results found for: PROLACTIN Lab Results  Component Value Date   CHOL 140 06/20/2017   TRIG 106 06/20/2017   HDL 29 (L) 06/20/2017   CHOLHDL 4.8 06/20/2017   VLDL 21 06/20/2017   LDLCALC 90 06/20/2017   Lab Results  Component Value Date   TSH 0.830 06/20/2017    Therapeutic Level Labs: No results found  for: LITHIUM No results found for: VALPROATE No components found for:  CBMZ  Current Medications: Current Outpatient Medications  Medication Sig Dispense Refill  . amitriptyline (ELAVIL) 50 MG tablet Take 1 tablet (50 mg total) by mouth at bedtime. 90 tablet 0  . amLODipine (NORVASC) 10 MG tablet Take 10 mg by mouth daily.    . cyclobenzaprine (FLEXERIL) 10 MG tablet Take 10 mg by mouth at bedtime.    . hydrochlorothiazide (HYDRODIURIL) 25 MG tablet Take 1 tablet (25 mg total) by mouth daily. 30 tablet 0  . HYDROcodone-acetaminophen (NORCO) 7.5-325 MG tablet Take 1 tablet by mouth 3 (three) times daily.     Marland Kitchen lisinopril (PRINIVIL,ZESTRIL) 40 MG tablet Take 40 mg by mouth daily.    . potassium chloride SA (K-DUR,KLOR-CON) 20 MEQ tablet Take 20 mEq by mouth 2 (two) times daily.    . pravastatin (PRAVACHOL) 20 MG tablet Take 20 mg by mouth daily.    . pregabalin (LYRICA) 200 MG capsule Take 1 capsule (200 mg total) by mouth 3 (three) times daily. 90 capsule 0  . DULoxetine (CYMBALTA) 60 MG capsule Take 2 capsules (120 mg total) by mouth daily. 180 capsule 1  . QUEtiapine (SEROQUEL) 100 MG tablet Take 1 tablet (100 mg total) by mouth at bedtime. 90 tablet 1   No current facility-administered medications for this visit.      Musculoskeletal: Strength & Muscle Tone: cogwheel and abnormal Gait & Station: shuffle Patient leans: Front  Psychiatric Specialty Exam: ROS  Blood pressure 128/74, pulse 92, height 6\' 1"  (1.854 m), weight 228 lb 3.2 oz (103.5 kg).Body mass index is 30.11 kg/m.  General Appearance: Casual and Fairly Groomed  Eye Contact:  Fair  Speech:  Normal Rate  Volume:  Decreased  Mood:  Euthymic  Affect:  Flat  Thought Process:  Goal Directed and Descriptions of Associations: Intact  Orientation:  Full (Time, Place, and Person)  Thought Content: Logical   Suicidal Thoughts:  No  Homicidal Thoughts:  No  Memory:  Immediate;   Fair  Judgement:  Fair  Insight:  Fair  Psychomotor Activity:  Decreased and Tremor  Concentration:  Attention Span: Fair  Recall:  [employer] of Knowledge: Fair  Language: Fair  Akathisia:  Negative  Handed:  Right  AIMS (if indicated):  done  Assets:  Communication Skills Desire for Improvement Financial Resources/Insurance Housing Intimacy Leisure Time Physical Health Resilience Social Support Talents/Skills Transportation Vocational/Educational  ADL's:  Intact  Cognition: WNL  Sleep:  Good   Screenings: AUDIT     Admission (Discharged) from 06/19/2017 in [facility]  Alcohol Use Disorder Identification  Test Final Score (AUDIT)  0       Assessment and Plan:  ALIZE ACY presents today for medication management follow-up for severe major depressive disorder with psychotic features.  He has had onset of parkinsonism with risperidone 2 mg twice daily, so we agreed to discontinue.  Given the nature of his depression, I do believe that he will continue to require low-dose antipsychotic for the time being.  We agreed to increase Cymbalta to 120 mg and augment with Seroquel 100 mg nightly.   Given his parkinsonism with risperidone, I am concerned and will continue to be monitoring for any other signs and symptoms of Parkinson's disease.  Unfortunately, this may be possible given that he has had increased pain, new onset of depression over the summer, and accompanying psychotic symptoms.  He shared today that he has had  some difficulty with memory, particularly accidentally doubling up on some of his medicines, so his wife is taken over dispensing his medications.  I educated the patient that parkinsonism response to antipsychotic, increases my sense of vigilance and concern for a possible neurocognitive disorder, but no diagnosis can be definitively made at this time, and the best course of action would be to proceed as below continue to address his mood symptoms and reduce side effects.  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 Severe recurrent major depression with psychotic features ([place])     Status of current problems: new parkinsonism, mood still depressed  Labs Ordered: No orders of the defined types were placed in this encounter.   Labs Reviewed: n/a  Collateral Obtained/Records Reviewed: coordination with wife at today's visit  Plan:  Increase Cymbalta to 120 mg daily Initiate Seroquel 100 mg nightly; if necessary, increase to 200 mg nightly Discontinue risperidone given parkinsonism symptoms We will consider TMS if depression symptoms continue to  persist  I spent 30 minutes with the patient in direct face-to-face clinical care.  Greater than 50% of this time was spent in counseling and coordination of care with the patient.    Aundra Dubin, MD 10/22/2017, 11:03 AM

## 2017-11-02 ENCOUNTER — Ambulatory Visit (INDEPENDENT_AMBULATORY_CARE_PROVIDER_SITE_OTHER): Payer: Medicare HMO | Admitting: Licensed Clinical Social Worker

## 2017-11-02 ENCOUNTER — Encounter (HOSPITAL_COMMUNITY): Payer: Self-pay | Admitting: Licensed Clinical Social Worker

## 2017-11-02 DIAGNOSIS — R69 Illness, unspecified: Secondary | ICD-10-CM | POA: Diagnosis not present

## 2017-11-02 DIAGNOSIS — F333 Major depressive disorder, recurrent, severe with psychotic symptoms: Secondary | ICD-10-CM

## 2017-11-02 DIAGNOSIS — F331 Major depressive disorder, recurrent, moderate: Secondary | ICD-10-CM

## 2017-11-02 NOTE — Progress Notes (Signed)
   THERAPIST PROGRESS NOTE  Session Time: 44:03KV-42:59DG  Participation Level: Active  Behavioral Response: Well GroomedAlertEuthymic  Type of Therapy: Individual Therapy  Treatment Goals addressed: Improve psychiatric symptoms, elevate mood (decreased irritability, increased enjoyment of activities), Improve unhelpful thought patterns, emotional regulation skills (reduce temper outburst), Interpersonal relationship skills  Interventions: Motivational Interviewing and Other: Grounding and Mindfulness techniques  Summary: Joel Gross is a 66 y.o. male who presents with Major Depressive Disorder, moderate, with psychotic features and Generalized Anxiety Disorder.  Suicidal/Homicidal: No without intent/plan  Therapist Response:  Amir met with clinician for individual therapy. [name] discussed his psychiatric symptoms and current life events. [name] shared that since Dr. Daron Offer changed his medication, there have been significant changes in his physical health and his mood. [name] reports he is feeling less anxious and less depressed. He also reports reduction of parkinsonian sxs, which has made a big difference in his mood and life overall. Clinician provided psychoeducation about CBT. Clinician discussed and provided information about the cognitive triangle and explored coping skills. Clinician guided [name] through gratitude meditation and encouraged this to be used several times per day. Clinician also encouraged [name] to be more present in activities of his family, even when they do not interest him, in order to get out of the house and enjoy life.   Plan: Return again in 1-2 weeks.  Diagnosis:     Axis I: Major Depressive Disorder, moderate, with psychotic features and Generalized Anxiety Disorder.   Jobe Marker [place], LCSW 11/02/2017

## 2017-11-04 DIAGNOSIS — G894 Chronic pain syndrome: Secondary | ICD-10-CM | POA: Diagnosis not present

## 2017-11-04 DIAGNOSIS — R69 Illness, unspecified: Secondary | ICD-10-CM | POA: Diagnosis not present

## 2017-11-04 DIAGNOSIS — G629 Polyneuropathy, unspecified: Secondary | ICD-10-CM | POA: Diagnosis not present

## 2017-11-04 DIAGNOSIS — M5090 Cervical disc disorder, unspecified, unspecified cervical region: Secondary | ICD-10-CM | POA: Diagnosis not present

## 2017-11-04 DIAGNOSIS — Z79891 Long term (current) use of opiate analgesic: Secondary | ICD-10-CM | POA: Diagnosis not present

## 2017-11-09 ENCOUNTER — Other Ambulatory Visit (HOSPITAL_COMMUNITY): Payer: Self-pay

## 2017-11-09 ENCOUNTER — Telehealth (HOSPITAL_COMMUNITY): Payer: Self-pay

## 2017-11-09 ENCOUNTER — Encounter (HOSPITAL_COMMUNITY): Payer: Self-pay | Admitting: Licensed Clinical Social Worker

## 2017-11-09 ENCOUNTER — Ambulatory Visit (INDEPENDENT_AMBULATORY_CARE_PROVIDER_SITE_OTHER): Payer: Medicare HMO | Admitting: Licensed Clinical Social Worker

## 2017-11-09 DIAGNOSIS — F411 Generalized anxiety disorder: Secondary | ICD-10-CM | POA: Diagnosis not present

## 2017-11-09 DIAGNOSIS — F323 Major depressive disorder, single episode, severe with psychotic features: Secondary | ICD-10-CM | POA: Diagnosis not present

## 2017-11-09 DIAGNOSIS — R69 Illness, unspecified: Secondary | ICD-10-CM | POA: Diagnosis not present

## 2017-11-09 MED ORDER — BENZTROPINE MESYLATE 1 MG PO TABS: 0.5000 mg | ORAL_TABLET | Freq: Every day | ORAL | 2 refills | Status: DC

## 2017-11-09 NOTE — Telephone Encounter (Signed)
Fair enough. Thank you for update.  [name]

## 2017-11-09 NOTE — Telephone Encounter (Signed)
Patient came to see therapist Evelena Peat today and reported feeling increased anxiety, increased depression, trouble sleeping and feeling like his skin is crawling. I spoke to Dr. Adele Schilder who gave a verbal order to decrease patients dose down to 50 mg and send in Cogentin .5 mg 1 tablet by mouth at bedtime, may increase to 1 mg after one week. This was done and I called the patient to let him know, I gave him my direct number to call with any concerns or questions.

## 2017-11-09 NOTE — Progress Notes (Signed)
   THERAPIST PROGRESS NOTE  Session Time: 11:00am-11:55am  Participation Level: Active  Behavioral Response: Well GroomedAlertAnxious and Depressed  Type of Therapy: Individual Therapy  Treatment Goals addressed: Improve psychiatric symptoms, elevate mood (decreased irritability, increased enjoyment of activities), Improve unhelpful thought patterns, emotional regulation skills (reduce temper outburst), Interpersonal relationship skills  Interventions: Motivational Interviewing and Other: Grounding and Mindfulness techniques  Summary: Joel Gross is a 67 y.o. male who presents with Major Depressive Disorder, severe, with psychotic features and Generalized Anxiety Disorder.  Suicidal/Homicidal: No without intent/plan  Therapist Response:  Joel Gross met with clinician for individual therapy. Joel Gross discussed his psychiatric symptoms and current life events. Joel Gross shared over the past few weeks, his anxiety has been up, he felt he needed to squeeze something, feeling like his skin was crawling, he had been feeling very tense and miserable, as well as increased depression and problems staying asleep for more than an hour or so at a time. Joel Gross reports his medications had been working well for a little while. However, they are no longer helping. Clinician provided contact info to nurse in order to reach out and talk about possible medication side effects.  Clinician explored coping skills for anxiety and noted that being alone increased sxs. However, he also noted that even when wife is home, he has been very quiet around her. Joel Gross reports he has been reading the obituaries daily. He identified that when he sees someone who has died that is his age, he feels like that person is "lucky". Clinician assessed for SI, but Joel Gross denied any active suicidal thoughts.   Plan: Return again in 1-2 weeks.  Diagnosis:     Axis I: Major Depressive Disorder, severe, with psychotic features and Generalized Anxiety  Disorder.    Joel Marker [place], LCSW 11/09/2017

## 2017-11-27 DIAGNOSIS — I10 Essential (primary) hypertension: Secondary | ICD-10-CM | POA: Diagnosis not present

## 2017-11-27 DIAGNOSIS — J690 Pneumonitis due to inhalation of food and vomit: Secondary | ICD-10-CM | POA: Diagnosis not present

## 2017-11-27 DIAGNOSIS — G92 Toxic encephalopathy: Secondary | ICD-10-CM | POA: Diagnosis not present

## 2017-11-27 DIAGNOSIS — R69 Illness, unspecified: Secondary | ICD-10-CM | POA: Diagnosis not present

## 2017-11-27 DIAGNOSIS — S0990XA Unspecified injury of head, initial encounter: Secondary | ICD-10-CM | POA: Diagnosis not present

## 2017-11-27 DIAGNOSIS — F418 Other specified anxiety disorders: Secondary | ICD-10-CM | POA: Diagnosis not present

## 2017-11-27 DIAGNOSIS — R0902 Hypoxemia: Secondary | ICD-10-CM | POA: Diagnosis not present

## 2017-11-27 DIAGNOSIS — R079 Chest pain, unspecified: Secondary | ICD-10-CM | POA: Diagnosis not present

## 2017-11-27 DIAGNOSIS — R4182 Altered mental status, unspecified: Secondary | ICD-10-CM | POA: Diagnosis not present

## 2017-11-27 DIAGNOSIS — K219 Gastro-esophageal reflux disease without esophagitis: Secondary | ICD-10-CM | POA: Diagnosis not present

## 2017-11-27 DIAGNOSIS — R404 Transient alteration of awareness: Secondary | ICD-10-CM | POA: Diagnosis not present

## 2017-11-27 DIAGNOSIS — N179 Acute kidney failure, unspecified: Secondary | ICD-10-CM | POA: Diagnosis not present

## 2017-11-27 DIAGNOSIS — G8929 Other chronic pain: Secondary | ICD-10-CM | POA: Diagnosis not present

## 2017-11-27 DIAGNOSIS — R531 Weakness: Secondary | ICD-10-CM | POA: Diagnosis not present

## 2017-11-27 DIAGNOSIS — J189 Pneumonia, unspecified organism: Secondary | ICD-10-CM | POA: Diagnosis not present

## 2017-11-27 DIAGNOSIS — E876 Hypokalemia: Secondary | ICD-10-CM | POA: Diagnosis not present

## 2017-11-27 DIAGNOSIS — G629 Polyneuropathy, unspecified: Secondary | ICD-10-CM | POA: Diagnosis not present

## 2017-11-27 DIAGNOSIS — F1120 Opioid dependence, uncomplicated: Secondary | ICD-10-CM | POA: Diagnosis not present

## 2017-12-01 DIAGNOSIS — R69 Illness, unspecified: Secondary | ICD-10-CM | POA: Diagnosis not present

## 2017-12-01 DIAGNOSIS — G609 Hereditary and idiopathic neuropathy, unspecified: Secondary | ICD-10-CM | POA: Diagnosis not present

## 2017-12-01 DIAGNOSIS — M1991 Primary osteoarthritis, unspecified site: Secondary | ICD-10-CM | POA: Diagnosis not present

## 2017-12-01 DIAGNOSIS — G8929 Other chronic pain: Secondary | ICD-10-CM | POA: Diagnosis not present

## 2017-12-01 DIAGNOSIS — I10 Essential (primary) hypertension: Secondary | ICD-10-CM | POA: Diagnosis not present

## 2017-12-01 DIAGNOSIS — G3184 Mild cognitive impairment, so stated: Secondary | ICD-10-CM | POA: Diagnosis not present

## 2017-12-01 DIAGNOSIS — Z9181 History of falling: Secondary | ICD-10-CM | POA: Diagnosis not present

## 2017-12-01 DIAGNOSIS — J189 Pneumonia, unspecified organism: Secondary | ICD-10-CM | POA: Diagnosis not present

## 2017-12-02 DIAGNOSIS — Z79891 Long term (current) use of opiate analgesic: Secondary | ICD-10-CM | POA: Diagnosis not present

## 2017-12-02 DIAGNOSIS — M5090 Cervical disc disorder, unspecified, unspecified cervical region: Secondary | ICD-10-CM | POA: Diagnosis not present

## 2017-12-02 DIAGNOSIS — G629 Polyneuropathy, unspecified: Secondary | ICD-10-CM | POA: Diagnosis not present

## 2017-12-02 DIAGNOSIS — G894 Chronic pain syndrome: Secondary | ICD-10-CM | POA: Diagnosis not present

## 2017-12-02 DIAGNOSIS — R69 Illness, unspecified: Secondary | ICD-10-CM | POA: Diagnosis not present

## 2017-12-04 DIAGNOSIS — G3184 Mild cognitive impairment, so stated: Secondary | ICD-10-CM | POA: Diagnosis not present

## 2017-12-04 DIAGNOSIS — I10 Essential (primary) hypertension: Secondary | ICD-10-CM | POA: Diagnosis not present

## 2017-12-04 DIAGNOSIS — M1991 Primary osteoarthritis, unspecified site: Secondary | ICD-10-CM | POA: Diagnosis not present

## 2017-12-04 DIAGNOSIS — G609 Hereditary and idiopathic neuropathy, unspecified: Secondary | ICD-10-CM | POA: Diagnosis not present

## 2017-12-04 DIAGNOSIS — Z9181 History of falling: Secondary | ICD-10-CM | POA: Diagnosis not present

## 2017-12-04 DIAGNOSIS — R69 Illness, unspecified: Secondary | ICD-10-CM | POA: Diagnosis not present

## 2017-12-04 DIAGNOSIS — G8929 Other chronic pain: Secondary | ICD-10-CM | POA: Diagnosis not present

## 2017-12-04 DIAGNOSIS — J189 Pneumonia, unspecified organism: Secondary | ICD-10-CM | POA: Diagnosis not present

## 2017-12-07 ENCOUNTER — Telehealth (HOSPITAL_COMMUNITY): Payer: Self-pay

## 2017-12-07 DIAGNOSIS — G629 Polyneuropathy, unspecified: Secondary | ICD-10-CM | POA: Diagnosis not present

## 2017-12-07 DIAGNOSIS — J189 Pneumonia, unspecified organism: Secondary | ICD-10-CM | POA: Diagnosis not present

## 2017-12-07 DIAGNOSIS — I10 Essential (primary) hypertension: Secondary | ICD-10-CM | POA: Diagnosis not present

## 2017-12-07 DIAGNOSIS — Z6830 Body mass index (BMI) 30.0-30.9, adult: Secondary | ICD-10-CM | POA: Diagnosis not present

## 2017-12-07 DIAGNOSIS — E041 Nontoxic single thyroid nodule: Secondary | ICD-10-CM | POA: Diagnosis not present

## 2017-12-07 DIAGNOSIS — R110 Nausea: Secondary | ICD-10-CM | POA: Diagnosis not present

## 2017-12-07 NOTE — Telephone Encounter (Signed)
He can go ahead and discontinue if he is having these side effects. No need to taper as he has been on this for a short period.

## 2017-12-07 NOTE — Telephone Encounter (Signed)
Patient is experiencing tremors from the Seroquel and it is making him nauseous in the mornings. Patient is requesting to come off of this medication, please review and advise, thank you

## 2017-12-08 NOTE — Telephone Encounter (Signed)
Called patient and let him know he could stop.

## 2017-12-09 ENCOUNTER — Encounter: Payer: Self-pay | Admitting: Neurology

## 2017-12-09 DIAGNOSIS — G609 Hereditary and idiopathic neuropathy, unspecified: Secondary | ICD-10-CM | POA: Diagnosis not present

## 2017-12-09 DIAGNOSIS — Z9181 History of falling: Secondary | ICD-10-CM | POA: Diagnosis not present

## 2017-12-09 DIAGNOSIS — J189 Pneumonia, unspecified organism: Secondary | ICD-10-CM | POA: Diagnosis not present

## 2017-12-09 DIAGNOSIS — R69 Illness, unspecified: Secondary | ICD-10-CM | POA: Diagnosis not present

## 2017-12-09 DIAGNOSIS — G3184 Mild cognitive impairment, so stated: Secondary | ICD-10-CM | POA: Diagnosis not present

## 2017-12-09 DIAGNOSIS — I10 Essential (primary) hypertension: Secondary | ICD-10-CM | POA: Diagnosis not present

## 2017-12-09 DIAGNOSIS — G8929 Other chronic pain: Secondary | ICD-10-CM | POA: Diagnosis not present

## 2017-12-09 DIAGNOSIS — M1991 Primary osteoarthritis, unspecified site: Secondary | ICD-10-CM | POA: Diagnosis not present

## 2017-12-10 DIAGNOSIS — J189 Pneumonia, unspecified organism: Secondary | ICD-10-CM | POA: Diagnosis not present

## 2017-12-11 DIAGNOSIS — I10 Essential (primary) hypertension: Secondary | ICD-10-CM | POA: Diagnosis not present

## 2017-12-11 DIAGNOSIS — M1991 Primary osteoarthritis, unspecified site: Secondary | ICD-10-CM | POA: Diagnosis not present

## 2017-12-11 DIAGNOSIS — G8929 Other chronic pain: Secondary | ICD-10-CM | POA: Diagnosis not present

## 2017-12-11 DIAGNOSIS — Z9181 History of falling: Secondary | ICD-10-CM | POA: Diagnosis not present

## 2017-12-11 DIAGNOSIS — G3184 Mild cognitive impairment, so stated: Secondary | ICD-10-CM | POA: Diagnosis not present

## 2017-12-11 DIAGNOSIS — R69 Illness, unspecified: Secondary | ICD-10-CM | POA: Diagnosis not present

## 2017-12-11 DIAGNOSIS — J189 Pneumonia, unspecified organism: Secondary | ICD-10-CM | POA: Diagnosis not present

## 2017-12-11 DIAGNOSIS — G609 Hereditary and idiopathic neuropathy, unspecified: Secondary | ICD-10-CM | POA: Diagnosis not present

## 2017-12-15 DIAGNOSIS — G3184 Mild cognitive impairment, so stated: Secondary | ICD-10-CM | POA: Diagnosis not present

## 2017-12-15 DIAGNOSIS — Z6830 Body mass index (BMI) 30.0-30.9, adult: Secondary | ICD-10-CM | POA: Diagnosis not present

## 2017-12-15 DIAGNOSIS — G8929 Other chronic pain: Secondary | ICD-10-CM | POA: Diagnosis not present

## 2017-12-15 DIAGNOSIS — J189 Pneumonia, unspecified organism: Secondary | ICD-10-CM | POA: Diagnosis not present

## 2017-12-15 DIAGNOSIS — R69 Illness, unspecified: Secondary | ICD-10-CM | POA: Diagnosis not present

## 2017-12-15 DIAGNOSIS — Z9181 History of falling: Secondary | ICD-10-CM | POA: Diagnosis not present

## 2017-12-15 DIAGNOSIS — G609 Hereditary and idiopathic neuropathy, unspecified: Secondary | ICD-10-CM | POA: Diagnosis not present

## 2017-12-15 DIAGNOSIS — R1084 Generalized abdominal pain: Secondary | ICD-10-CM | POA: Diagnosis not present

## 2017-12-15 DIAGNOSIS — I10 Essential (primary) hypertension: Secondary | ICD-10-CM | POA: Diagnosis not present

## 2017-12-15 DIAGNOSIS — M1991 Primary osteoarthritis, unspecified site: Secondary | ICD-10-CM | POA: Diagnosis not present

## 2017-12-16 DIAGNOSIS — R69 Illness, unspecified: Secondary | ICD-10-CM | POA: Diagnosis not present

## 2017-12-16 DIAGNOSIS — G8929 Other chronic pain: Secondary | ICD-10-CM | POA: Diagnosis not present

## 2017-12-16 DIAGNOSIS — M1991 Primary osteoarthritis, unspecified site: Secondary | ICD-10-CM | POA: Diagnosis not present

## 2017-12-16 DIAGNOSIS — Z9181 History of falling: Secondary | ICD-10-CM | POA: Diagnosis not present

## 2017-12-16 DIAGNOSIS — G3184 Mild cognitive impairment, so stated: Secondary | ICD-10-CM | POA: Diagnosis not present

## 2017-12-16 DIAGNOSIS — I10 Essential (primary) hypertension: Secondary | ICD-10-CM | POA: Diagnosis not present

## 2017-12-16 DIAGNOSIS — J189 Pneumonia, unspecified organism: Secondary | ICD-10-CM | POA: Diagnosis not present

## 2017-12-16 DIAGNOSIS — G609 Hereditary and idiopathic neuropathy, unspecified: Secondary | ICD-10-CM | POA: Diagnosis not present

## 2017-12-17 DIAGNOSIS — E041 Nontoxic single thyroid nodule: Secondary | ICD-10-CM | POA: Diagnosis not present

## 2017-12-18 DIAGNOSIS — G3184 Mild cognitive impairment, so stated: Secondary | ICD-10-CM | POA: Diagnosis not present

## 2017-12-18 DIAGNOSIS — G609 Hereditary and idiopathic neuropathy, unspecified: Secondary | ICD-10-CM | POA: Diagnosis not present

## 2017-12-18 DIAGNOSIS — I10 Essential (primary) hypertension: Secondary | ICD-10-CM | POA: Diagnosis not present

## 2017-12-18 DIAGNOSIS — M1991 Primary osteoarthritis, unspecified site: Secondary | ICD-10-CM | POA: Diagnosis not present

## 2017-12-18 DIAGNOSIS — G8929 Other chronic pain: Secondary | ICD-10-CM | POA: Diagnosis not present

## 2017-12-18 DIAGNOSIS — Z9181 History of falling: Secondary | ICD-10-CM | POA: Diagnosis not present

## 2017-12-18 DIAGNOSIS — R69 Illness, unspecified: Secondary | ICD-10-CM | POA: Diagnosis not present

## 2017-12-18 DIAGNOSIS — J189 Pneumonia, unspecified organism: Secondary | ICD-10-CM | POA: Diagnosis not present

## 2017-12-21 DIAGNOSIS — G8929 Other chronic pain: Secondary | ICD-10-CM | POA: Diagnosis not present

## 2017-12-21 DIAGNOSIS — G609 Hereditary and idiopathic neuropathy, unspecified: Secondary | ICD-10-CM | POA: Diagnosis not present

## 2017-12-21 DIAGNOSIS — G3184 Mild cognitive impairment, so stated: Secondary | ICD-10-CM | POA: Diagnosis not present

## 2017-12-21 DIAGNOSIS — Z9181 History of falling: Secondary | ICD-10-CM | POA: Diagnosis not present

## 2017-12-21 DIAGNOSIS — J189 Pneumonia, unspecified organism: Secondary | ICD-10-CM | POA: Diagnosis not present

## 2017-12-21 DIAGNOSIS — I10 Essential (primary) hypertension: Secondary | ICD-10-CM | POA: Diagnosis not present

## 2017-12-21 DIAGNOSIS — R69 Illness, unspecified: Secondary | ICD-10-CM | POA: Diagnosis not present

## 2017-12-21 DIAGNOSIS — M1991 Primary osteoarthritis, unspecified site: Secondary | ICD-10-CM | POA: Diagnosis not present

## 2017-12-23 DIAGNOSIS — G8929 Other chronic pain: Secondary | ICD-10-CM | POA: Diagnosis not present

## 2017-12-23 DIAGNOSIS — Z9181 History of falling: Secondary | ICD-10-CM | POA: Diagnosis not present

## 2017-12-23 DIAGNOSIS — M1991 Primary osteoarthritis, unspecified site: Secondary | ICD-10-CM | POA: Diagnosis not present

## 2017-12-23 DIAGNOSIS — G3184 Mild cognitive impairment, so stated: Secondary | ICD-10-CM | POA: Diagnosis not present

## 2017-12-23 DIAGNOSIS — J189 Pneumonia, unspecified organism: Secondary | ICD-10-CM | POA: Diagnosis not present

## 2017-12-23 DIAGNOSIS — I10 Essential (primary) hypertension: Secondary | ICD-10-CM | POA: Diagnosis not present

## 2017-12-23 DIAGNOSIS — R69 Illness, unspecified: Secondary | ICD-10-CM | POA: Diagnosis not present

## 2017-12-23 DIAGNOSIS — G609 Hereditary and idiopathic neuropathy, unspecified: Secondary | ICD-10-CM | POA: Diagnosis not present

## 2017-12-24 ENCOUNTER — Ambulatory Visit (INDEPENDENT_AMBULATORY_CARE_PROVIDER_SITE_OTHER): Payer: Medicare HMO | Admitting: Licensed Clinical Social Worker

## 2017-12-24 ENCOUNTER — Encounter (HOSPITAL_COMMUNITY): Payer: Self-pay | Admitting: Licensed Clinical Social Worker

## 2017-12-24 DIAGNOSIS — F331 Major depressive disorder, recurrent, moderate: Secondary | ICD-10-CM

## 2017-12-24 DIAGNOSIS — R69 Illness, unspecified: Secondary | ICD-10-CM | POA: Diagnosis not present

## 2017-12-24 DIAGNOSIS — F411 Generalized anxiety disorder: Secondary | ICD-10-CM

## 2017-12-24 DIAGNOSIS — F321 Major depressive disorder, single episode, moderate: Secondary | ICD-10-CM | POA: Diagnosis not present

## 2017-12-24 NOTE — Progress Notes (Signed)
   THERAPIST PROGRESS NOTE  Session Time: 3:30pm-4:30pm  Participation Level: Active  Behavioral Response: Well GroomedAlertAnxious and Depressed  Type of Therapy: Individual Therapy  Treatment Goals addressed: Improve psychiatric symptoms, elevate mood (decreased irritability, increased enjoyment of activities), Improve unhelpful thought patterns, emotional regulation skills (reduce temper outburst), Interpersonal relationship skills  Interventions: Motivational Interviewing and Other: Grounding and Mindfulness techniques  Summary: Joel Gross is a 66 y.o. male who presents with Major Depressive Disorder,moderate and Generalized Anxiety Disorder.  Suicidal/Homicidal: No without intent/plan  Therapist Response:  Joel Gross met with clinician for individual therapy. Joel Gross discussed his psychiatric symptoms and current life events. Joel Gross shared that he had been in the hospital for a few days due to falling out of his bed and also having pneumonia. He reports that while being examined in the hospital, they started noticing what may be the early stages of Dementia. Joel Gross reports he gets a little confused, has some difficulty grasping things, memory fades, and he feels "brain fog". Joel Gross reports he is already working with a neurologist and his wife has taken over his med administration to ensure he is taking everything as prescribed. Joel Gross reports he has been feeling anxious and depressed at times. However, he identified being okay with using a cane and asking for help from others. Joel Gross reports he has started sleeping downstairs in his office, which he likes a lot. He reports he is worried about falling down stairs, so this is a better choice for him now.   Plan: Return again in 3-4 weeks. Communicate with Dr. Eksir about medications for mood stability.   Diagnosis:     Axis I: Major Depressive Disorder,moderate and Generalized Anxiety Disorder.    Jessica R Schlosberg, LCSW 12/24/2017  

## 2017-12-29 DIAGNOSIS — M1991 Primary osteoarthritis, unspecified site: Secondary | ICD-10-CM | POA: Diagnosis not present

## 2017-12-29 DIAGNOSIS — J189 Pneumonia, unspecified organism: Secondary | ICD-10-CM | POA: Diagnosis not present

## 2017-12-29 DIAGNOSIS — Z9181 History of falling: Secondary | ICD-10-CM | POA: Diagnosis not present

## 2017-12-29 DIAGNOSIS — G8929 Other chronic pain: Secondary | ICD-10-CM | POA: Diagnosis not present

## 2017-12-29 DIAGNOSIS — I10 Essential (primary) hypertension: Secondary | ICD-10-CM | POA: Diagnosis not present

## 2017-12-29 DIAGNOSIS — R69 Illness, unspecified: Secondary | ICD-10-CM | POA: Diagnosis not present

## 2017-12-29 DIAGNOSIS — G3184 Mild cognitive impairment, so stated: Secondary | ICD-10-CM | POA: Diagnosis not present

## 2017-12-29 DIAGNOSIS — G609 Hereditary and idiopathic neuropathy, unspecified: Secondary | ICD-10-CM | POA: Diagnosis not present

## 2017-12-30 DIAGNOSIS — R69 Illness, unspecified: Secondary | ICD-10-CM | POA: Diagnosis not present

## 2017-12-30 DIAGNOSIS — Z79891 Long term (current) use of opiate analgesic: Secondary | ICD-10-CM | POA: Diagnosis not present

## 2017-12-30 DIAGNOSIS — G894 Chronic pain syndrome: Secondary | ICD-10-CM | POA: Diagnosis not present

## 2017-12-30 DIAGNOSIS — M5090 Cervical disc disorder, unspecified, unspecified cervical region: Secondary | ICD-10-CM | POA: Diagnosis not present

## 2017-12-30 DIAGNOSIS — G629 Polyneuropathy, unspecified: Secondary | ICD-10-CM | POA: Diagnosis not present

## 2018-01-07 ENCOUNTER — Encounter (HOSPITAL_COMMUNITY): Payer: Self-pay | Admitting: Licensed Clinical Social Worker

## 2018-01-07 ENCOUNTER — Other Ambulatory Visit (HOSPITAL_COMMUNITY): Payer: Self-pay

## 2018-01-07 ENCOUNTER — Ambulatory Visit (INDEPENDENT_AMBULATORY_CARE_PROVIDER_SITE_OTHER): Payer: Medicare HMO | Admitting: Licensed Clinical Social Worker

## 2018-01-07 DIAGNOSIS — F331 Major depressive disorder, recurrent, moderate: Secondary | ICD-10-CM | POA: Diagnosis not present

## 2018-01-07 DIAGNOSIS — F411 Generalized anxiety disorder: Secondary | ICD-10-CM | POA: Diagnosis not present

## 2018-01-07 DIAGNOSIS — R69 Illness, unspecified: Secondary | ICD-10-CM | POA: Diagnosis not present

## 2018-01-07 NOTE — Progress Notes (Signed)
   THERAPIST PROGRESS NOTE  Session Time: 11:00am-11:45am  Participation Level: Active  Behavioral Response: Well GroomedAlertDysphoric  Type of Therapy: Individual Therapy  Treatment Goals addressed: Improve psychiatric symptoms, elevate mood (decreased irritability, increased enjoyment of activities), Improve unhelpful thought patterns, emotional regulation skills (reduce temper outburst), Interpersonal relationship skills  Interventions: Motivational Interviewing and Other: Grounding and Mindfulness techniques  Summary: Joel Gross is a 67 y.o. male who presents with Major Depressive Disorder, moderate and Generalized Anxiety Disorder.  Suicidal/Homicidal: No without intent/plan  Therapist Response:  Joel Gross met with clinician for individual therapy. Joel Gross discussed his psychiatric symptoms and current life events. Joel Gross shared that he has been feeling on edge, very anxious, shaky, unable to sit still, and increased social anxiety. Joel Gross reports he has been diagnosed with early dementia and has been having some confusion and memory loss. He also reports he has been sleeping downstairs in his office in order to reduce risk of falling down the 13 stairs. Joel Gross reports he has been exercising on a stationary bike. Clinician encouraged Joel Gross to stay downstairs and to move his clothing down to the office so there is no reason to worry about stairs. Clinician also encouraged Joel Gross to continue asking for help as needed in order to reduce risks for falls.  Joel Gross discussed sxs of shakiness and movement with nurse and Dr. Daron Offer. Clinician also consulted Dr. Daron Offer about sxs.    Plan: Return again in 2 weeks.  Diagnosis:     Axis I: Major Depressive Disorder, moderate and Generalized Anxiety Disorder.    [place], LCSW 01/07/2018

## 2018-01-20 ENCOUNTER — Ambulatory Visit (HOSPITAL_COMMUNITY): Payer: Self-pay | Admitting: Psychiatry

## 2018-01-21 ENCOUNTER — Encounter (HOSPITAL_COMMUNITY): Payer: Self-pay | Admitting: Licensed Clinical Social Worker

## 2018-01-21 ENCOUNTER — Ambulatory Visit (INDEPENDENT_AMBULATORY_CARE_PROVIDER_SITE_OTHER): Payer: Medicare HMO | Admitting: Licensed Clinical Social Worker

## 2018-01-21 DIAGNOSIS — F411 Generalized anxiety disorder: Secondary | ICD-10-CM | POA: Diagnosis not present

## 2018-01-21 DIAGNOSIS — F330 Major depressive disorder, recurrent, mild: Secondary | ICD-10-CM

## 2018-01-21 DIAGNOSIS — R69 Illness, unspecified: Secondary | ICD-10-CM | POA: Diagnosis not present

## 2018-01-21 NOTE — Progress Notes (Signed)
   THERAPIST PROGRESS NOTE  Session Time: 11:00am-12:00pm  Participation Level: Active  Behavioral Response: Well GroomedAlertAnxious  Type of Therapy: Individual Therapy  Treatment Goals addressed: Improve psychiatric symptoms, elevate mood (decreased irritability, increased enjoyment of activities), Improve unhelpful thought patterns, emotional regulation skills (reduce temper outburst), Interpersonal relationship skills  Interventions: Motivational Interviewing and Other: Grounding and Mindfulness techniques  Summary: Joel Gross is a 67 y.o. male who presents with Major Depressive Disorder, mild and Generalized Anxiety Disorder.  Suicidal/Homicidal: No without intent/plan  Therapist Response:  Joel Gross met with clinician for individual therapy. Joel Gross discussed his psychiatric symptoms and current life events. Joel Gross shared he is feeling a bit better over the past few weeks. He reports he has gotten his medications corrected and his wife gives him little individual containers with each day and time for his medication. Joel Gross reports he has been struggling with panic, especially in the car when his wife drives. However, he reports she is a good driver and he does not feel safe driving anymore with early dementia sxs. Joel Gross reports he is sleeping downstairs in his office, which has been good for him. However, his clothes and tv are still up in the bedroom. Clinician explored options for loving him clothes and tv downstairs so he would not have to worry about walking up and down, and decreasing fall risk. Joel Gross reports wife is going to [facility] with grandchildren and daughters for a cheer competition in may. Joel Gross reports he is nervous, but his wife has lined up someone to stay with him over that week so he won't be alone. Clinician discussed the value of acceptance with his limitations, memory, and need to rely on wife now.    Plan: Return again in 3-4 weeks.  Diagnosis:     Axis I: Major Depressive  Disorder, mild and Generalized Anxiety Disorder.  Jobe Marker [place], LCSW 01/21/2018

## 2018-01-27 ENCOUNTER — Encounter (HOSPITAL_COMMUNITY): Payer: Self-pay | Admitting: Psychiatry

## 2018-01-27 ENCOUNTER — Ambulatory Visit (INDEPENDENT_AMBULATORY_CARE_PROVIDER_SITE_OTHER): Payer: Medicare HMO | Admitting: Psychiatry

## 2018-01-27 VITALS — BP 140/86 | HR 94 | Ht 73.0 in | Wt 230.4 lb

## 2018-01-27 DIAGNOSIS — R45851 Suicidal ideations: Secondary | ICD-10-CM | POA: Diagnosis not present

## 2018-01-27 DIAGNOSIS — F330 Major depressive disorder, recurrent, mild: Secondary | ICD-10-CM

## 2018-01-27 DIAGNOSIS — R419 Unspecified symptoms and signs involving cognitive functions and awareness: Secondary | ICD-10-CM | POA: Diagnosis not present

## 2018-01-27 DIAGNOSIS — Z818 Family history of other mental and behavioral disorders: Secondary | ICD-10-CM

## 2018-01-27 DIAGNOSIS — G212 Secondary parkinsonism due to other external agents: Secondary | ICD-10-CM | POA: Diagnosis not present

## 2018-01-27 DIAGNOSIS — R69 Illness, unspecified: Secondary | ICD-10-CM | POA: Diagnosis not present

## 2018-01-27 DIAGNOSIS — F411 Generalized anxiety disorder: Secondary | ICD-10-CM | POA: Diagnosis not present

## 2018-01-27 NOTE — Progress Notes (Signed)
[facility] MD/PA/NP OP Progress Note  01/27/2018 1:04 PM Joel Gross  MRN:  [id number]  Chief Complaint: not doing well  HPI: Joel Gross reports that he is continuing to struggle with depressed mood.  He does take the Elavil and Cymbalta consistently as prescribed.  He reports that he feels easily flustered by things that he used to know how to do, and feels frustrated with his memory and cognitive abilities.  He reports that he feels like he is deteriorating more and more and he does not understand why.  He shares that he has a cousin who was hospitalized for dementia at the age of 12, and rapidly deteriorated and eventually passed away within a few years.  Patient also notes that his gait has changed over the past 2 years, in addition to noticing more twitching and neuromuscular changes, in addition to difficulty with speech.  This is complicated by his multiple dental surgeries and dentures that are ill fitting.  I spent time with him expressing my concerns about his cognitive symptoms, discussing pseudodementia as a possible differential diagnosis, but also expressed my concern that there could be neurocognitive symptoms at play.  His wife joined [name] and I provided som education about  diagnostic testing.  They were agreeable for neuropsych testing and EEG.  I educated them about some of the rapidly progressing dementia as and expressed concern that we need to rule out other medical etiologies contributing to his depressive symptoms.    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 GAD (generalized anxiety disorder) F41.1 Ambulatory referral to Psychology  3. MDD (major depressive disorder), recurrent episode, mild ([place]) F33.0 Ambulatory referral to Psychology  4. Neurocognitive disorder R41.9 EEG    Ambulatory referral to Psychology    Past Psychiatric History: See intake H&P for full details.  Reviewed, with no updates at this time.   Past Medical History:  Past Medical History:  Diagnosis Date  . Hypertension   . Neuropathy    History reviewed. No pertinent surgical history.  Family Psychiatric History: See intake H&P for full details. Reviewed, with no updates at this time.   Family History:  Family History  Problem Relation Age of Onset  . Anxiety disorder Mother   . Depression Mother     Social History:  Social History   Socioeconomic History  . Marital status: Married    Spouse name: Not on file  . Number of children: Not on file  . Years of education: Not on file  . Highest education level: Not on file  Occupational History  . Not on file  Social Needs  . Financial resource strain: Not on file  . Food insecurity:    Worry: Not on file    Inability: Not on file  . Transportation needs:    Medical: Not on file    Non-medical: Not on file  Tobacco Use  . Smoking status: Never Smoker  . Smokeless tobacco: Never Used  Substance and Sexual Activity  . Alcohol use: No  . Drug use: No  . Sexual activity: Yes    Birth control/protection: None  Lifestyle  . Physical activity:    Days per week: Not on file    Minutes per session: Not on file  . Stress: Not on file  Relationships  . Social connections:    Talks on phone: Not on file  Gets together: Not on file    Attends religious service: Not on file    Active member of club or organization: Not on file    Attends meetings of clubs or organizations: Not on file    Relationship status: Not on file  Other Topics Concern  . Not on file  Social History Narrative  . Not on file    Allergies:  Allergies  Allergen Reactions  . Risperidone And Related Other (See Comments)    parkinsonism    Metabolic Disorder Labs: Lab Results  Component Value Date   HGBA1C 5.5 06/20/2017   MPG 111.15 06/20/2017   No results found for: PROLACTIN Lab Results  Component Value Date   CHOL 140 06/20/2017    TRIG 106 06/20/2017   HDL 29 (L) 06/20/2017   CHOLHDL 4.8 06/20/2017   VLDL 21 06/20/2017   LDLCALC 90 06/20/2017   Lab Results  Component Value Date   TSH 0.830 06/20/2017    Therapeutic Level Labs: No results found for: LITHIUM No results found for: VALPROATE No components found for:  CBMZ  Current Medications: Current Outpatient Medications  Medication Sig Dispense Refill  . amitriptyline (ELAVIL) 50 MG tablet Take 1 tablet (50 mg total) by mouth at bedtime. 90 tablet 0  . amLODipine (NORVASC) 10 MG tablet Take 10 mg by mouth daily.    . cyclobenzaprine (FLEXERIL) 10 MG tablet Take 10 mg by mouth at bedtime.    . DULoxetine (CYMBALTA) 60 MG capsule Take 2 capsules (120 mg total) by mouth daily. 180 capsule 1  . HYDROcodone-acetaminophen (NORCO) 7.5-325 MG tablet Take 1 tablet by mouth 3 (three) times daily.    Marland Kitchen lisinopril (PRINIVIL,ZESTRIL) 40 MG tablet Take 40 mg by mouth daily.    . potassium chloride SA (K-DUR,KLOR-CON) 20 MEQ tablet Take 20 mEq by mouth 2 (two) times daily.    . pravastatin (PRAVACHOL) 20 MG tablet Take 20 mg by mouth daily.    . pregabalin (LYRICA) 200 MG capsule Take 1 capsule (200 mg total) by mouth 3 (three) times daily. 90 capsule 0   No current facility-administered medications for this visit.      Musculoskeletal: Strength & Muscle Tone: spastic and abnormal Gait & Station: unsteady Patient leans: N/A  Psychiatric Specialty Exam: ROS  Blood pressure 140/86, pulse 94, height 6\' 1"  (1.854 m), weight 230 lb 6.4 oz (104.5 kg).Body mass index is 30.4 kg/m.  General Appearance: Disheveled  Eye Contact:  Fair  Speech:  Garbled and dentures loose  Volume:  Decreased  Mood:  Anxious and Dysphoric  Affect:  Congruent  Thought Process:  Goal Directed and Descriptions of Associations: Circumstantial  Orientation:  Full (Time, Place, and Person)  Thought Content: Logical, Delusions and vague paranoia, sense of dejavu   Suicidal Thoughts:  Yes.   without intent/plan  Homicidal Thoughts:  No  Memory:  Immediate;   Poor  Judgement:  Fair  Insight:  Present  Psychomotor Activity:  Tremor  Concentration:  Concentration: Fair  Recall:  [employer] of Knowledge: Fair  Language: Fair  Akathisia:  Negative  Handed:  Right  AIMS (if indicated): not done  Assets:  Communication Skills Desire for Improvement Financial Resources/Insurance Housing Intimacy Social Support  ADL's:  Intact  Cognition: Impaired,  Mild  Sleep:  Good   Screenings: AUDIT     Admission (Discharged) from 06/19/2017 in [facility]  Alcohol Use Disorder Identification Test Final Score (AUDIT)  0  Assessment and Plan:  Joel Gross is a 67 year old male with rapid changes to his mood over the past 6-8 months.  He was psychiatrically hospitalized in August 2018 after reporting severe depression symptoms for approximately 3 months and a plan to kill himself with his gun.  He no longer has access to his firearm, and is also committed himself to staying alive for his family.  He has excellent support from his wife and family.  Until August 2018, he has never had any past psychiatric hospitalizations or need to see a psychiatrist.  He was taking amitriptyline for chronic nerve pain and sleep, but has otherwise not struggled with any lifelong psychiatric symptoms.  Is a gotten to know the patient over the past 5 months, I have been increasingly concerned about a primary neurocognitive disorder contributing to his presentation.  He has been incredibly sensitive to atypical antipsychotics, experiencing EPS quite easily with low doses.  In addition I have noticed that his gait has changed, in addition to his speech patterns, and he has experienced a subjective worsening of his memory.  His wife corroborates these changes and reports that she is quite concerned about her husband.  He has a family history of rapidly progressing dementia and his  cousin, but they are unclear about the details of this.  The patient has a prior CT scan from last year which showed microvascular ischemic changes.  Frankly I am concerned about a possible prion disease and have made a referral for the patient to see neurology, and also for EEG testing.  I would also like neuropsychological testing to further explore contributing factors to his depression and cognitive changes, and clarify if there is any element of pseudodementia contributing to his depression symptoms.  We will continue Cymbalta and amitriptyline as prescribed.  I would like to have more information and data before we consider any further interventions to address his mood symptoms.  He may ultimately be an appropriate candidate for [place] for treatment of severe major depressive disorder, but we would need to rule out other contributing medical factors before we made this determination.   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 GAD (generalized anxiety disorder)   3. MDD (major depressive disorder), recurrent episode, mild ([place])   4. Neurocognitive disorder     Status of current problems: gradually worsening  Labs Ordered: Orders Placed This Encounter  Procedures  . Ambulatory referral to Psychology    Referral Priority:   Routine    Referral Type:   Psychiatric    Referral Reason:   Specialty Services Required    Requested Specialty:   Psychology    Number of Visits Requested:   1  . EEG    Please schedule asap, patient with rapid cognitive changes, concern for possible prion disease    Standing Status:   Future    Standing Expiration Date:   01/28/2019    Order Specific Question:   Where should this test be performed?    Answer:   [facility]    Labs Reviewed: n/a  Collateral Obtained/Records Reviewed: wife is present, corroborates significant worsening of his memory and gait in addition to neuromuscular abilities over the past 6 months  Plan:  Consultation with  neurology pending, appreciate their input and evaluation Neuropsychological testing EEG ordered and patient/wife are on board with testing Continue Cymbalta and amitriptyline as prescribed Return to clinic in 3 weeks as scheduled  I spent 30 minutes with the patient  in direct face-to-face clinical care.  Greater than 50% of this time was spent in counseling and coordination of care with the patient.    Aundra Dubin, MD 01/27/2018, 1:04 PM

## 2018-02-01 ENCOUNTER — Ambulatory Visit (INDEPENDENT_AMBULATORY_CARE_PROVIDER_SITE_OTHER): Payer: Medicare HMO | Admitting: Neurology

## 2018-02-01 DIAGNOSIS — G629 Polyneuropathy, unspecified: Secondary | ICD-10-CM | POA: Diagnosis not present

## 2018-02-01 DIAGNOSIS — G212 Secondary parkinsonism due to other external agents: Secondary | ICD-10-CM | POA: Diagnosis not present

## 2018-02-01 DIAGNOSIS — Z79891 Long term (current) use of opiate analgesic: Secondary | ICD-10-CM | POA: Diagnosis not present

## 2018-02-01 DIAGNOSIS — F1120 Opioid dependence, uncomplicated: Secondary | ICD-10-CM | POA: Diagnosis not present

## 2018-02-01 DIAGNOSIS — R69 Illness, unspecified: Secondary | ICD-10-CM | POA: Diagnosis not present

## 2018-02-01 DIAGNOSIS — G894 Chronic pain syndrome: Secondary | ICD-10-CM | POA: Diagnosis not present

## 2018-02-01 DIAGNOSIS — R419 Unspecified symptoms and signs involving cognitive functions and awareness: Secondary | ICD-10-CM

## 2018-02-01 DIAGNOSIS — M5090 Cervical disc disorder, unspecified, unspecified cervical region: Secondary | ICD-10-CM | POA: Diagnosis not present

## 2018-02-02 NOTE — Procedures (Signed)
ELECTROENCEPHALOGRAM REPORT  Date of Study: 02/01/2018  Patient's Name: Joel Gross MRN: [id number] Date of Birth: [birth year]  Referring Provider: Dr. Lulu Riding  Clinical History: This is a 67 year old man with rapid mood changes, concern for prion disease.  Medications: LYRICA 200 MG capsule ELAVIL 50 MG tablet NORVASC 10 MG tablet  FLEXERIL 10 MG tablet   CYMBALTA 60 MG capsule  NORCO 7.5-325 MG tablet   PRINIVIL,ZESTRIL 40 MG tablet  K-DUR,KLOR-CON 20 MEQ tablet   PRAVACHOL) 20 MG tablet   Technical Summary: A multichannel digital EEG recording measured by the international 10-20 system with electrodes applied with paste and impedances below 5000 ohms performed in our laboratory with EKG monitoring in an awake and asleep patient.  Hyperventilation and photic stimulation were performed.  The digital EEG was referentially recorded, reformatted, and digitally filtered in a variety of bipolar and referential montages for optimal display.    Description: The patient is awake and asleep during the recording.  During maximal wakefulness, there is a symmetric, medium voltage 8-8.5 Hz posterior dominant rhythm that attenuates with eye opening.  The record is symmetric.  During drowsiness and sleep, there is an increase in theta slowing of the background.  Vertex waves and poorly formed sleep spindles were seen.  Hyperventilation and photic stimulation did not elicit any abnormalities.  There were no epileptiform discharges or electrographic seizures seen.    EKG lead was unremarkable.  Impression: This awake and asleep EEG is normal.    Clinical Correlation: A normal EEG does not exclude a clinical diagnosis of epilepsy.  If further clinical questions remain, prolonged EEG may be helpful.  Clinical correlation is advised.   Ellouise Newer, M.D.

## 2018-02-03 ENCOUNTER — Telehealth (HOSPITAL_COMMUNITY): Payer: Self-pay | Admitting: Psychiatry

## 2018-02-03 NOTE — Telephone Encounter (Signed)
Excellent thank yoU!

## 2018-02-03 NOTE — Telephone Encounter (Signed)
I spoke with patient and his wife regarding the EEG results.  Spent time discussing my concern that many of his memory and cognitive symptoms are secondary to severe depression, such as a pseudodementia process with severity of depression.   I do think he would still benefit from a neurology consultation to rule out any other underlying neurocognitive processes, particularly given his neuromotor symptoms.  I continue to be concerned that there could be some element of idiopathic parkinsonism given his profound sensitivity to neuroleptic agents.  Given the severity of depression and multiple medication trials, we agreed to proceed with [place].  I educated the patient and his wife on the risks of [place] including 1 out of 1000 patient risk of seizure, pain at the treatment site, loud noise, and the 36-day commitment to treatment that is required for clinical benefit.  I educated them on the modality of effect for [place].  Patient and his wife were both very receptive to this and wish to proceed with treatment as soon as possible.   No acute safety issues or change in his mental status recently, continues to struggle with the same severity of depression as reflected on in our past visit.   -Proceed with [place] mapping and treatment at next available mapping appointment

## 2018-02-03 NOTE — Telephone Encounter (Signed)
Sounds good! I will get started

## 2018-02-04 ENCOUNTER — Ambulatory Visit (HOSPITAL_COMMUNITY): Payer: Medicare HMO | Admitting: Licensed Clinical Social Worker

## 2018-02-18 ENCOUNTER — Ambulatory Visit (INDEPENDENT_AMBULATORY_CARE_PROVIDER_SITE_OTHER): Payer: Medicare HMO | Admitting: Psychiatry

## 2018-02-18 ENCOUNTER — Encounter (HOSPITAL_COMMUNITY): Payer: Self-pay | Admitting: Psychiatry

## 2018-02-18 DIAGNOSIS — F333 Major depressive disorder, recurrent, severe with psychotic symptoms: Secondary | ICD-10-CM | POA: Diagnosis not present

## 2018-02-18 DIAGNOSIS — Z818 Family history of other mental and behavioral disorders: Secondary | ICD-10-CM | POA: Diagnosis not present

## 2018-02-18 DIAGNOSIS — R69 Illness, unspecified: Secondary | ICD-10-CM | POA: Diagnosis not present

## 2018-02-18 MED ORDER — AMITRIPTYLINE HCL 50 MG PO TABS: 50.0000 mg | ORAL_TABLET | Freq: Every day | ORAL | 0 refills | Status: DC

## 2018-02-18 MED ORDER — BUSPIRONE HCL 5 MG PO TABS: 5.0000 mg | ORAL_TABLET | Freq: Three times a day (TID) | ORAL | 0 refills | Status: DC

## 2018-02-18 NOTE — Progress Notes (Signed)
[place] MD/PA/NP OP Progress Note  02/18/2018 9:50 AM Joel Gross  MRN:  [id number]  Chief Complaint: med management  HPI: Joel Gross presents with no change in his depressive symptoms, ongoing anhedonia, anxiety, dysphoria, apathy.  No acute safety concerns, he understands that we are scheduling him for [place] next Friday morning at 11 AM, and he is ready to move forward with this.  He has some generalized anxiety and has become a little bit more nervous about driving.  I suggested a trial of BuSpar on an as-needed basis and he was receptive to this.  He has been on Xanax in the past for anxiety with good effect, but I expressed my concern given his age and risk of falls.  Ultimately if he does need to have some Xanax on hand, we may proceed with a low-dose of 0.25 mg daily as needed.  Visit Diagnosis:    ICD-10-CM   1. 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 Reviewed, with no updates at this time.   Past Medical History:  Past Medical History:  Diagnosis Date  . Hypertension   . Neuropathy    History reviewed. No pertinent surgical history.  Family Psychiatric History: See intake H&P for full details. Reviewed, with no updates at this time.   Family History:  Family History  Problem Relation Age of Onset  . Anxiety disorder Mother   . Depression Mother     Social History:  Social History   Socioeconomic History  . Marital status: Married    Spouse name: Not on file  . Number of children: Not on file  . Years of education: Not on file  . Highest education level: Not on file  Occupational History  . Not on file  Social Needs  . Financial resource strain: Not on file  . Food insecurity:    Worry: Not on file    Inability: Not on file  . Transportation needs:    Medical: Not on file    Non-medical: Not on file  Tobacco Use  . Smoking status:  Never Smoker  . Smokeless tobacco: Never Used  Substance and Sexual Activity  . Alcohol use: No  . Drug use: No  . Sexual activity: Yes    Birth control/protection: None  Lifestyle  . Physical activity:    Days per week: Not on file    Minutes per session: Not on file  . Stress: Not on file  Relationships  . Social connections:    Talks on phone: Not on file    Gets together: Not on file    Attends religious service: Not on file    Active member of club or organization: Not on file    Attends meetings of clubs or organizations: Not on file    Relationship status: Not on file  Other Topics Concern  . Not on file  Social History Narrative  . Not on file    Allergies:  Allergies  Allergen Reactions  . Risperidone And Related Other (See Comments)    parkinsonism    Metabolic Disorder Labs: Lab Results  Component Value Date   HGBA1C 5.5 06/20/2017   MPG 111.15 06/20/2017   No results found for: PROLACTIN Lab Results  Component Value Date   CHOL 140 06/20/2017   TRIG 106 06/20/2017   HDL 29 (L) 06/20/2017   CHOLHDL 4.8 06/20/2017  VLDL 21 06/20/2017   LDLCALC 90 06/20/2017   Lab Results  Component Value Date   TSH 0.830 06/20/2017    Therapeutic Level Labs: No results found for: LITHIUM No results found for: VALPROATE No components found for:  CBMZ  Current Medications: Current Outpatient Medications  Medication Sig Dispense Refill  . amitriptyline (ELAVIL) 50 MG tablet Take 1 tablet (50 mg total) by mouth at bedtime. 90 tablet 0  . amLODipine (NORVASC) 10 MG tablet Take 10 mg by mouth daily.    . cyclobenzaprine (FLEXERIL) 10 MG tablet Take 10 mg by mouth at bedtime.    . DULoxetine (CYMBALTA) 60 MG capsule Take 2 capsules (120 mg total) by mouth daily. 180 capsule 1  . HYDROcodone-acetaminophen (NORCO) 7.5-325 MG tablet Take 1 tablet by mouth 3 (three) times daily.    Marland Kitchen lisinopril (PRINIVIL,ZESTRIL) 40 MG tablet Take 40 mg by mouth daily.    .  potassium chloride SA (K-DUR,KLOR-CON) 20 MEQ tablet Take 20 mEq by mouth 2 (two) times daily.    . pravastatin (PRAVACHOL) 20 MG tablet Take 20 mg by mouth daily.    . pregabalin (LYRICA) 200 MG capsule Take 1 capsule (200 mg total) by mouth 3 (three) times daily. 90 capsule 0  . busPIRone (BUSPAR) 5 MG tablet Take 1 tablet (5 mg total) by mouth 3 (three) times daily. 90 tablet 0   No current facility-administered medications for this visit.      Musculoskeletal: Strength & Muscle Tone: within normal limits Gait & Station: normal Patient leans: N/A  Psychiatric Specialty Exam: ROS  Blood pressure (!) 142/80, pulse 90, height 6\' 1"  (1.854 m), weight 234 lb (106.1 kg).Body mass index is 30.87 kg/m.  General Appearance: Casual and Fairly Groomed  Eye Contact:  Good  Speech:  Clear and Coherent  Volume:  Normal  Mood:  Anxious and Euthymic  Affect:  Appropriate and Congruent  Thought Process:  Coherent, Goal Directed and Descriptions of Associations: Intact  Orientation:  Full (Time, Place, and Person)  Thought Content: Logical   Suicidal Thoughts:  No  Homicidal Thoughts:  No  Memory:  Immediate;   Fair  Judgement:  Fair  Insight:  Fair  Psychomotor Activity:  Normal  Concentration:  Concentration: Good  Recall:  Good  Fund of Knowledge: Good  Language: Good  Akathisia:  Negative  Handed:  Right  AIMS (if indicated): not done  Assets:  Communication Skills Desire for Improvement Financial Resources/Insurance Housing Social Support Vocational/Educational  ADL's:  Intact  Cognition: WNL  Sleep:  Fair   Screenings: AUDIT     Admission (Discharged) from 06/19/2017 in [place]  Alcohol Use Disorder Identification Test Final Score (AUDIT)  0       Assessment and Plan:  Joel Gross presents with no change in his depression symptoms.  We will be proceeding with [place] next week on Friday, April 26 at 11 AM for initial mapping.  We agreed to a  low-dose of BuSpar to use as needed for anxiety symptoms.  We will follow-up in 10-12 weeks after he is completed his treatment with [facility].  I have already reviewed the EEG results with him and his wife, and he is still scheduled to see neurology next month.  1. Severe recurrent major depression with psychotic features ([place])     Status of current problems: unchanged  Labs Ordered: No orders of the defined types were placed in this encounter.   Labs Reviewed: n/a  Collateral  Obtained/Records Reviewed: n/a  Plan:  buspar 5 mg TID prn cymbalta 120 mg daily Elavil 50 mg nightly for sleep rtc 10 weeks  I spent 15 minutes with the patient in direct face-to-face clinical care.  Greater than 50% of this time was spent in counseling and coordination of care with the patient.    Aundra Dubin, MD 02/18/2018, 9:50 AM

## 2018-02-26 ENCOUNTER — Other Ambulatory Visit (HOSPITAL_COMMUNITY): Payer: Medicare HMO | Attending: Psychiatry | Admitting: Emergency Medicine

## 2018-02-26 DIAGNOSIS — F332 Major depressive disorder, recurrent severe without psychotic features: Secondary | ICD-10-CM | POA: Diagnosis not present

## 2018-02-26 DIAGNOSIS — R69 Illness, unspecified: Secondary | ICD-10-CM | POA: Diagnosis not present

## 2018-02-26 NOTE — Progress Notes (Signed)
Pt reported to [facility] for cortical mapping and motor threshold determination for Repetitive Transcranial Magnetic Stimulation treatment for Major Depressive Disorder. Pt was accompanied to tx session by his wife. Pt completed a PHQ-9 with a score of 22 (severe depression). Pt also completed a Beck's Depression Inventory with a score of 30 (severe depression). Prior to procedure, pt signed an informed consent agreement for [place] treatment. Pt's treatment area was found by applying single pulses to her left motor cortex, hunting along the anterior/posterior plane and along the superior oblique angle until the best motor response was elicited from the pt's right thumb. The best response was observed at 7.2cm A/P and 30 degrees SOA, with a coil angle of 0 degrees. Pt's motor threshold was calculated using the [employer]'s proprietary MT Assist algorithm, which produced a calculated motor threshold of 1.8 SMT. Per these findings, pt's treatment parameters are as follows: A/P -- 12.7 cm, SOA -- 30 degrees, Coil Angle --  +15 degrees, Motor Threshold -- 1.29 SMT. With these parameters, the pt will receive 36 sessions of TMS according to the following protocol: 3000 pulses per session, with stimulation in bursts of pulses lasting 4 seconds at a frequency of 10 Hz, separated by 26 seconds of rest. After determining pt's tx parameters, coil was moved to the treatment location, and the first burst of pulses was applied at a reduced power of 80%MT. Power level was titrated to 105%. Increasing to optimal power level of 120% will be conducted by the end of next week. Pt reported no complaints, and stated that the stimulation was tolerable. Upon completion of mapping, pt completed a few treatment intervals for observation of side effects. Pt tolerated tx well. Pt and wife departed from clinic without issue.

## 2018-03-01 ENCOUNTER — Other Ambulatory Visit (INDEPENDENT_AMBULATORY_CARE_PROVIDER_SITE_OTHER): Payer: Medicare HMO | Admitting: Emergency Medicine

## 2018-03-01 DIAGNOSIS — F332 Major depressive disorder, recurrent severe without psychotic features: Secondary | ICD-10-CM | POA: Diagnosis not present

## 2018-03-01 DIAGNOSIS — R69 Illness, unspecified: Secondary | ICD-10-CM | POA: Diagnosis not present

## 2018-03-01 NOTE — Progress Notes (Signed)
Patient reported to [facility] for Repetitive Transcranial Magnetic Stimulation treatment for severe episode of recurrent major depressive disorder, without psychotic features. Patient presented with appropriate affect, level mood and denied any suicidal or homicidal ideations. Patient denies any other current symptoms and remains optimistic with continued [place] treatment. Patient reported no change in alcohol/substane use, caffeine consumption, sleep pattern or metal implant status since previous treatment. Ptpresented withpleasantaffect to txsession accompanied by his wife.He reported that he did not experience any headaches during the weekend. He watched TV during tx session and periodically spoke to wife and Probation officer. The power level has been titrated to the optimal measure of 120%. He did not express any discomfort.Power levelwas at 120%for the duration of the tx.Patient reported nocomplaints anddiscomfort during tx.Patientand wife departed post-treatment with no majorreported concerns.

## 2018-03-02 ENCOUNTER — Other Ambulatory Visit (INDEPENDENT_AMBULATORY_CARE_PROVIDER_SITE_OTHER): Payer: Medicare HMO | Admitting: Emergency Medicine

## 2018-03-02 DIAGNOSIS — F332 Major depressive disorder, recurrent severe without psychotic features: Secondary | ICD-10-CM | POA: Diagnosis not present

## 2018-03-02 DIAGNOSIS — R69 Illness, unspecified: Secondary | ICD-10-CM | POA: Diagnosis not present

## 2018-03-02 NOTE — Progress Notes (Signed)
Patient reported to [facility] for Repetitive Transcranial Magnetic Stimulation treatment for severe episode of recurrent major depressive disorder, without psychotic features. Patient presented with appropriate affect, level mood and denied any suicidal or homicidal ideations. Patient denies any other current symptoms and remains optimistic with continued [place] treatment. Patient reported no change in alcohol/substane use, caffeine consumption, sleep pattern or metal implant status since previous treatment. Ptpresented withpleasantaffect to txsession accompanied by his wife.Hereported that he has been seeing positive changes in his behavior. He is Power levelwas at 120%for the duration of the tx.Patient reported nocomplaints anddiscomfort during tx.Patientand wife departed post-treatment with no majorreported concerns.

## 2018-03-03 ENCOUNTER — Other Ambulatory Visit (HOSPITAL_COMMUNITY): Payer: Medicare HMO | Attending: Psychiatry | Admitting: Emergency Medicine

## 2018-03-03 DIAGNOSIS — M5090 Cervical disc disorder, unspecified, unspecified cervical region: Secondary | ICD-10-CM | POA: Diagnosis not present

## 2018-03-03 DIAGNOSIS — G894 Chronic pain syndrome: Secondary | ICD-10-CM | POA: Diagnosis not present

## 2018-03-03 DIAGNOSIS — F332 Major depressive disorder, recurrent severe without psychotic features: Secondary | ICD-10-CM

## 2018-03-03 DIAGNOSIS — G629 Polyneuropathy, unspecified: Secondary | ICD-10-CM | POA: Diagnosis not present

## 2018-03-03 DIAGNOSIS — Z79891 Long term (current) use of opiate analgesic: Secondary | ICD-10-CM | POA: Diagnosis not present

## 2018-03-03 DIAGNOSIS — R69 Illness, unspecified: Secondary | ICD-10-CM | POA: Diagnosis not present

## 2018-03-03 NOTE — Progress Notes (Addendum)
Patient reported to [facility] for Repetitive Transcranial Magnetic Stimulation treatment for severe episode of recurrent major depressive disorder, without psychotic features. Patient presented with appropriate affect, level mood and denied any suicidal or homicidal ideations. Patient denies any other current symptoms and remains optimistic with continued [place] treatment. Patient reported no change in alcohol/substane use, caffeine consumption, sleep pattern or metal implant status since previous treatment. Ptpresented withpleasantaffect to txsession accompanied by his wife.  He shared that he had a great evening yesterday. He spent time with his dogs and cats. His main coping skill is utilizing pet therapy. He shared stories about his pets during tx session and periodically watched TV.Power levelwas at 120%for the duration of the tx.Patient reported nocomplaints anddiscomfort during tx.Patientand wife departed post-treatment with no majorreported concerns.

## 2018-03-04 ENCOUNTER — Other Ambulatory Visit (INDEPENDENT_AMBULATORY_CARE_PROVIDER_SITE_OTHER): Payer: Medicare HMO | Admitting: Emergency Medicine

## 2018-03-04 ENCOUNTER — Ambulatory Visit (INDEPENDENT_AMBULATORY_CARE_PROVIDER_SITE_OTHER): Payer: Managed Care, Other (non HMO) | Admitting: Licensed Clinical Social Worker

## 2018-03-04 ENCOUNTER — Encounter (HOSPITAL_COMMUNITY): Payer: Self-pay | Admitting: Licensed Clinical Social Worker

## 2018-03-04 DIAGNOSIS — F332 Major depressive disorder, recurrent severe without psychotic features: Secondary | ICD-10-CM

## 2018-03-04 DIAGNOSIS — R69 Illness, unspecified: Secondary | ICD-10-CM | POA: Diagnosis not present

## 2018-03-04 NOTE — Progress Notes (Signed)
THERAPIST PROGRESS NOTE  Session Time: 2:30pm-3:30pm  Participation Level: Active  Behavioral Response: Well GroomedAlertEuthymic  Type of Therapy: Individual Therapy  Treatment Goals addressed: Improve psychiatric symptoms, elevate mood (decreased irritability, increased enjoyment of activities), Improve unhelpful thought patterns, emotional regulation skills (reduce temper outburst), Interpersonal relationship skills  Interventions: Motivational Interviewing and Other: Grounding and Mindfulness techniques  Summary: [name] is a 67 y.o. male who presents with Major Depressive Disorder, severe, with psychotic features and Generalized Anxiety Disorder.  Suicidal/Homicidal: No without intent/plan  Therapist Response:  Ziyan met with clinician for individual therapy. Albin discussed his psychiatric symptoms and current life events. Dolph shared that he has been feeling pretty good lately. He reports TMS is going well, although it's "not not painful". Nicklos reports that he feels hopeful that the [place] will help him feel less stressed and more able to cope with life. Clinician explored depressive sxs and current status of memory and other dementia sxs. Matteus reports he feels things are improving and he has not had any major episodes over the past several weeks. Shakim reports his wife will be going to [facility] for a week and he will be home alone. Clinician discussed the safety plan and ensured that family members would be coming by to check on him, feed him, and be on call for him.  Ruthvik discussed experiences in his youth with being drafted into the [employer]. Clinician utilized MI to reflect and summarize thoughts and feelings about his experiences.    Plan: Return again in 4-6 weeks.  Diagnosis:     Axis I: Major Depressive Disorder, severe, with psychotic features and Generalized Anxiety Disorder.  [place]ell, LCSW 03/04/2018

## 2018-03-04 NOTE — Progress Notes (Signed)
Patient reported to [facility] for Repetitive Transcranial Magnetic Stimulation treatment for severe episode of recurrent major depressive disorder, without psychotic features. Patient presented with appropriate affect, level mood and denied any suicidal or homicidal ideations. Patient denies any other current symptoms and remains optimistic with continued [place] treatment. Patient reported no change in alcohol/substane use, caffeine consumption, sleep pattern or metal implant status since previous treatment. Ptpresented withpleasantaffect to txsessionaccompanied by his wife. The married couple talked about their marriage. He shared stories about his kids. He expressed gratitude to his wife being present during his battle with depression. He periodically watched TV during tx session.Power levelwas at 120%for the duration of the tx.Patient reported nocomplaints anddiscomfort during tx.Patientand wifedeparted post-treatment with no majorreported concerns.

## 2018-03-05 ENCOUNTER — Other Ambulatory Visit (INDEPENDENT_AMBULATORY_CARE_PROVIDER_SITE_OTHER): Payer: Medicare HMO | Admitting: Emergency Medicine

## 2018-03-05 DIAGNOSIS — F332 Major depressive disorder, recurrent severe without psychotic features: Secondary | ICD-10-CM | POA: Diagnosis not present

## 2018-03-05 DIAGNOSIS — R69 Illness, unspecified: Secondary | ICD-10-CM | POA: Diagnosis not present

## 2018-03-05 NOTE — Progress Notes (Signed)
Patient reported to [facility] for Repetitive Transcranial Magnetic Stimulation treatment for severe episode of recurrent major depressive disorder, without psychotic features. Patient presented with appropriate affect, level mood and denied any suicidal or homicidal ideations. Patient denies any other current symptoms and remains optimistic with continued [place] treatment. Patient reported no change in alcohol/substane use, caffeine consumption, sleep pattern or metal implant status since previous treatment. Ptpresented withpleasantaffect to txsessionaccompanied by his wife. He shared that his appointment with the LSW, Anselmo Rod went well. He shared that he will be relaxing this weekend. He watched TV during tx session.Power levelwas at 120%for the duration of the tx.Patient reported nocomplaints anddiscomfort during tx.Patientand wifedeparted post-treatment with no majorreported concerns.

## 2018-03-08 ENCOUNTER — Other Ambulatory Visit (INDEPENDENT_AMBULATORY_CARE_PROVIDER_SITE_OTHER): Payer: Medicare HMO

## 2018-03-08 DIAGNOSIS — R69 Illness, unspecified: Secondary | ICD-10-CM | POA: Diagnosis not present

## 2018-03-08 DIAGNOSIS — F332 Major depressive disorder, recurrent severe without psychotic features: Secondary | ICD-10-CM | POA: Diagnosis not present

## 2018-03-08 NOTE — Progress Notes (Signed)
Patient reported to [facility] for Repetitive Transcranial Magnetic Stimulation treatment for severe episode of recurrent major depressive disorder, without psychotic features. Patient presented with appropriate affect, level mood and denied any suicidal or homicidal ideations. Patient denies any other current symptoms and remains optimistic with continued [place] tx. Patient reported no changed in alcohol/substance use, caffeine consumption, sleep pattern or metal implant status since previous tx. Patient arrived for tx with his wife. Patient reported the tx has been tolerable and not as painful as before. Patient stated that he has been feeling a little better since starting [place]. Patient reported no complaints or discomfort during the duration of tx. Patient watched television and engaged in conversation for the duration of tx. Patient departed post-treatment with no reported concerns.

## 2018-03-09 ENCOUNTER — Encounter (HOSPITAL_COMMUNITY): Payer: Self-pay

## 2018-03-09 ENCOUNTER — Other Ambulatory Visit (INDEPENDENT_AMBULATORY_CARE_PROVIDER_SITE_OTHER): Payer: Medicare HMO

## 2018-03-09 DIAGNOSIS — R69 Illness, unspecified: Secondary | ICD-10-CM | POA: Diagnosis not present

## 2018-03-09 DIAGNOSIS — F332 Major depressive disorder, recurrent severe without psychotic features: Secondary | ICD-10-CM

## 2018-03-09 NOTE — Progress Notes (Signed)
Patient reported to [facility] for Repetitive Transcranial Magnetic Stimulation treatment for severe episode of recurrent major depressive disorder, without psychotic features. Patient presented with appropriate affect, level mood and denied any suicidal or homicidal ideations. Patient denies any other current symptoms and remains optimistic with continued [place] tx. Patient reported no changed in alcohol/substance use, caffeine consumption, sleep pattern or metal implant status since previous tx. Patient arrived for tx with wife. Patient reported no complaints or discomfort during the duration of tx. Patient watched television and talked during tx. Patient departed post-treatment with no reported concerns.

## 2018-03-10 ENCOUNTER — Other Ambulatory Visit (INDEPENDENT_AMBULATORY_CARE_PROVIDER_SITE_OTHER): Payer: Medicare HMO | Admitting: Emergency Medicine

## 2018-03-10 DIAGNOSIS — R69 Illness, unspecified: Secondary | ICD-10-CM | POA: Diagnosis not present

## 2018-03-10 DIAGNOSIS — F332 Major depressive disorder, recurrent severe without psychotic features: Secondary | ICD-10-CM

## 2018-03-10 NOTE — Progress Notes (Signed)
Patient reported to [facility] for Repetitive Transcranial Magnetic Stimulation treatment for severe episode of recurrent major depressive disorder, without psychotic features. Patient presented with appropriate affect, level mood and denied any suicidal or homicidal ideations. Patient denies any other current symptoms and remains optimistic with continued [place] treatment. Patient reported no change in alcohol/substane use, caffeine consumption, sleep pattern or metal implant status since previous treatment. Ptpresented withpleasantaffect to txsessionaccompanied by his wife. He shared that his wife will be going on vacation. He does not feel comfortable driving by himself, so one of his other relatives will be bringing him for the next couple of days. He watched TV during tx session and spoke with wife and Probation officer. Power levelwas at 120%for the duration of the tx.Patient reported nocomplaints anddiscomfort during tx.Patientand wifedeparted post-treatment with no majorreported concerns.

## 2018-03-11 ENCOUNTER — Other Ambulatory Visit (INDEPENDENT_AMBULATORY_CARE_PROVIDER_SITE_OTHER): Payer: Medicare HMO | Admitting: Emergency Medicine

## 2018-03-11 DIAGNOSIS — F332 Major depressive disorder, recurrent severe without psychotic features: Secondary | ICD-10-CM

## 2018-03-11 DIAGNOSIS — R69 Illness, unspecified: Secondary | ICD-10-CM | POA: Diagnosis not present

## 2018-03-11 NOTE — Progress Notes (Signed)
Patient reported to [facility] for Repetitive Transcranial Magnetic Stimulation treatment for severe episode of recurrent major depressive disorder, without psychotic features. Patient presented with appropriate affect, level mood and denied any suicidal or homicidal ideations. Patient denies any other current symptoms and remains optimistic with continued [place] treatment. Patient reported no change in alcohol/substane use, caffeine consumption, sleep pattern or metal implant status since previous treatment. Ptpresented withpleasantaffect to txsessionaccompanied by brother in- law. He shared that he will be spending time with his dogs and cats while his wife is gone on vacation. He watched TV during tx session.Power levelwas at 120%for the duration of the tx.Patient reported nocomplaints anddiscomfort during tx.Patientand brother in-lawdeparted post-treatment with no majorreported concerns.

## 2018-03-12 ENCOUNTER — Encounter (HOSPITAL_COMMUNITY): Payer: Self-pay

## 2018-03-15 ENCOUNTER — Encounter (HOSPITAL_COMMUNITY): Payer: Self-pay

## 2018-03-15 ENCOUNTER — Other Ambulatory Visit (INDEPENDENT_AMBULATORY_CARE_PROVIDER_SITE_OTHER): Payer: Medicare HMO | Admitting: Emergency Medicine

## 2018-03-15 DIAGNOSIS — R69 Illness, unspecified: Secondary | ICD-10-CM | POA: Diagnosis not present

## 2018-03-15 DIAGNOSIS — F332 Major depressive disorder, recurrent severe without psychotic features: Secondary | ICD-10-CM | POA: Diagnosis not present

## 2018-03-15 NOTE — Progress Notes (Addendum)
Patient reported to [facility] for Repetitive Transcranial Magnetic Stimulation treatment for severe episode of recurrent major depressive disorder, without psychotic features. Patient presented with appropriate affect, level mood and denied any suicidal or homicidal ideations. Patient denies any other current symptoms and remains optimistic with continued [place] treatment. Patient reported no change in alcohol/substane use, caffeine consumption, sleep pattern or metal implant status since previous treatment. Ptpresented withpleasantaffect to txsessionaccompanied by sister in-law. He shared that he has been able to see a difference in his energy level. He was able to complete household chores and leave the house to run errands. He watched TV during tx session and periodically talked to Probation officer. Power levelwas at 120%for the duration of the tx.Patient reported nocomplaints anddiscomfort during tx.Patientand sister in-law departed post-treatment with no majorreported concerns.

## 2018-03-16 ENCOUNTER — Encounter (HOSPITAL_COMMUNITY): Payer: Self-pay

## 2018-03-16 ENCOUNTER — Other Ambulatory Visit (INDEPENDENT_AMBULATORY_CARE_PROVIDER_SITE_OTHER): Payer: Medicare HMO | Admitting: Emergency Medicine

## 2018-03-16 DIAGNOSIS — F332 Major depressive disorder, recurrent severe without psychotic features: Secondary | ICD-10-CM

## 2018-03-16 DIAGNOSIS — R69 Illness, unspecified: Secondary | ICD-10-CM | POA: Diagnosis not present

## 2018-03-16 NOTE — Progress Notes (Signed)
Patient reported to [facility] for Repetitive Transcranial Magnetic Stimulation treatment for severe episode of recurrent major depressive disorder, without psychotic features. Patient presented with appropriate affect, level mood and denied any suicidal or homicidal ideations. Patient denies any other current symptoms and remains optimistic with continued [place] treatment. Patient reported no change in alcohol/substane use, caffeine consumption, sleep pattern or metal implant status since previous treatment. Ptpresented withpleasantaffect to txsessionaccompanied by sister in- law.He shared that he is happy that his wife is back home. He woke up at 5 am to clean the house. He watched tv during tx session and talked periodically to Probation officer and sister in-law.  Power levelwas at 120%for the duration of the tx.Patient reported nocomplaints anddiscomfort during tx.Patientand brother in-lawdeparted post-treatment with no majorreported concerns.

## 2018-03-17 ENCOUNTER — Other Ambulatory Visit (HOSPITAL_COMMUNITY): Payer: Self-pay | Admitting: Psychiatry

## 2018-03-17 ENCOUNTER — Encounter (HOSPITAL_COMMUNITY): Payer: Self-pay

## 2018-03-17 DIAGNOSIS — K5900 Constipation, unspecified: Secondary | ICD-10-CM | POA: Diagnosis not present

## 2018-03-17 DIAGNOSIS — Z79891 Long term (current) use of opiate analgesic: Secondary | ICD-10-CM | POA: Diagnosis not present

## 2018-03-17 DIAGNOSIS — M1990 Unspecified osteoarthritis, unspecified site: Secondary | ICD-10-CM | POA: Diagnosis not present

## 2018-03-17 DIAGNOSIS — F419 Anxiety disorder, unspecified: Secondary | ICD-10-CM | POA: Diagnosis not present

## 2018-03-17 DIAGNOSIS — I10 Essential (primary) hypertension: Secondary | ICD-10-CM | POA: Diagnosis not present

## 2018-03-17 DIAGNOSIS — G4700 Insomnia, unspecified: Secondary | ICD-10-CM | POA: Diagnosis not present

## 2018-03-17 DIAGNOSIS — G8929 Other chronic pain: Secondary | ICD-10-CM | POA: Diagnosis not present

## 2018-03-17 DIAGNOSIS — E785 Hyperlipidemia, unspecified: Secondary | ICD-10-CM | POA: Diagnosis not present

## 2018-03-17 DIAGNOSIS — G629 Polyneuropathy, unspecified: Secondary | ICD-10-CM | POA: Diagnosis not present

## 2018-03-17 DIAGNOSIS — R69 Illness, unspecified: Secondary | ICD-10-CM | POA: Diagnosis not present

## 2018-03-18 ENCOUNTER — Other Ambulatory Visit (INDEPENDENT_AMBULATORY_CARE_PROVIDER_SITE_OTHER): Payer: Medicare HMO | Admitting: Emergency Medicine

## 2018-03-18 DIAGNOSIS — F332 Major depressive disorder, recurrent severe without psychotic features: Secondary | ICD-10-CM | POA: Diagnosis not present

## 2018-03-18 DIAGNOSIS — R69 Illness, unspecified: Secondary | ICD-10-CM | POA: Diagnosis not present

## 2018-03-18 NOTE — Progress Notes (Signed)
Patient reported to [facility] for Repetitive Transcranial Magnetic Stimulation treatment for severe episode of recurrent major depressive disorder, without psychotic features. Patient presented with appropriate affect, level mood and denied any suicidal or homicidal ideations. Patient denies any other current symptoms and remains optimistic with continued [place] treatment. Patient reported no change in alcohol/substane use, caffeine consumption, sleep pattern or metal implant status since previous treatment. Ptpresented withpleasantaffect to txsessionaccompanied by his wife.He shared that he has been seeing positive behavioral changes such as driving by himself, completing household chores and attending social events. He watched tv during tx session and wife spoke with Probation officer.  Power levelwas at 120%for the duration of the tx.Patient reported nocomplaints anddiscomfort during tx.Patientandbrother in-lawdeparted post-treatment with no majorreported concerns.

## 2018-03-19 ENCOUNTER — Ambulatory Visit: Payer: Managed Care, Other (non HMO) | Admitting: Neurology

## 2018-03-19 ENCOUNTER — Encounter: Payer: Self-pay | Admitting: Neurology

## 2018-03-19 ENCOUNTER — Other Ambulatory Visit (INDEPENDENT_AMBULATORY_CARE_PROVIDER_SITE_OTHER): Payer: Medicare HMO | Admitting: Emergency Medicine

## 2018-03-19 ENCOUNTER — Encounter

## 2018-03-19 VITALS — BP 140/90 | HR 80 | Ht 73.0 in | Wt 236.1 lb

## 2018-03-19 DIAGNOSIS — F332 Major depressive disorder, recurrent severe without psychotic features: Secondary | ICD-10-CM | POA: Diagnosis not present

## 2018-03-19 DIAGNOSIS — G894 Chronic pain syndrome: Secondary | ICD-10-CM | POA: Diagnosis not present

## 2018-03-19 DIAGNOSIS — G609 Hereditary and idiopathic neuropathy, unspecified: Secondary | ICD-10-CM

## 2018-03-19 DIAGNOSIS — F329 Major depressive disorder, single episode, unspecified: Secondary | ICD-10-CM | POA: Diagnosis not present

## 2018-03-19 DIAGNOSIS — G3184 Mild cognitive impairment, so stated: Secondary | ICD-10-CM | POA: Diagnosis not present

## 2018-03-19 DIAGNOSIS — R69 Illness, unspecified: Secondary | ICD-10-CM | POA: Diagnosis not present

## 2018-03-19 NOTE — Patient Instructions (Signed)
Great to see you today.  Follow-up with pain management for your neuropathic pain  Daily foot inspection recommend  For any uneven ground, take extra precautions and use a hiking stick as needed  Return to clinic as needed

## 2018-03-19 NOTE — Progress Notes (Signed)
[facility] Clinic Note - Initial Visit   Date: 03/19/18  LONZIE SIMMER MRN: [id number] DOB: 07-Aug-1951   Dear Dr.  Lin Landsman:  Thank you for your kind referral of Joel Gross for consultation of neuropathy and memory changes. Although his history is well known to you, please allow [name] to reiterate it for the purpose of our medical record. The patient was accompanied to the clinic by wife who also provides collateral information.     History of Present Illness: Joel Gross is a 67 y.o. right-handed Caucasian male with obesity, depression, hypertension, former smoker presenting for evaluation of peripheral neuropathy and memory changes.  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   He has 10 year history of peripheral neuropathy involving the feet which was intermittent at first and since 2014, symptoms have become constant.  He was evaluated at [facility] and had NCS/EMG of the legs which was consistent with neuropathy.  He has severe painful peripheral neuropathy with spinal cord stimulator and is followed by pain management.  He takes Lyrica 200mg  TID, amitriptyline 50mg  at bedtime (depression and pain), Cymbalta 120mg  (depression and pain), hydrocodone 7.5mg  4 times daily.  His pain is well-controlled on this regimen.  He has mild imbalance and had three falls over the past year.  He does not use gait assist device.  He denies weakness of the legs. No paresthesia of the hands.   He has long history of depression and sees Dr. Daron Offer. Patient's follow-up visit in March shows progressive memory and behavior changes.  Following Christmas, he stopped driving because of generalized weakness and fatigue which was found to be secondary to hypokalemia. Since this time, he became very depressed and had increased dependency on his wife for IADLs, including driving.  His psychiatrist is concerned about prion disease because of changes in cognition, gait, and poor  response to medical therapy for depression. Routine EEG was normal.  He started transcranial magnetic stimulation in April and feels that his memory and mood is doing much better, which wife also agrees with.  He started to drive again 2 weeks ago.  He is able to manage money.  He has some memory loss and tends to repeat himself. His wife has noticed that he perseverates "alright now".  He denies muscle twitches or lack of coordination.  Out-side paper records, electronic medical record, and images have been reviewed where available and summarized as:  CT head 06/22/2017:  1. No acute or reversible finding. No specific explanation for memory loss. 2. Moderate chronic microvascular ischemic type changes in the cerebral white matter.  Labs 06/20/2017:  RPR neg, HIV neg, vitamin B12 472, HbA1c 5.5, TSH 0.8  Labs 01/26/2017:  Vitamin B12 584, TSH 1.66  Routine EEG 02/01/2018:  Normal  Past Medical History:  Diagnosis Date  . Hypertension   . Neuropathy     History reviewed. No pertinent surgical history.   Medications:  Outpatient Encounter Medications as of 03/19/2018  Medication Sig  . amitriptyline (ELAVIL) 50 MG tablet Take 1 tablet (50 mg total) by mouth at bedtime.  Marland Kitchen amLODipine (NORVASC) 10 MG tablet Take 10 mg by mouth daily.  . busPIRone (BUSPAR) 5 MG tablet TAKE 1 TABLET BY MOUTH THREE TIMES DAILY  . cyclobenzaprine (FLEXERIL) 10 MG tablet Take 10 mg by mouth at bedtime.  . DULoxetine (CYMBALTA) 60 MG capsule Take 2 capsules (120 mg total) by mouth daily.  Marland Kitchen HYDROcodone-acetaminophen (NORCO) 7.5-325 MG  tablet Take 1 tablet by mouth 3 (three) times daily.  Marland Kitchen lisinopril (PRINIVIL,ZESTRIL) 40 MG tablet Take 40 mg by mouth daily.  . potassium chloride SA (K-DUR,KLOR-CON) 20 MEQ tablet Take 20 mEq by mouth 2 (two) times daily.  . pravastatin (PRAVACHOL) 20 MG tablet Take 20 mg by mouth daily.  . pregabalin (LYRICA) 200 MG capsule Take 1 capsule (200 mg total) by mouth 3 (three) times  daily.   No facility-administered encounter medications on file as of 03/19/2018.      Allergies:  Allergies  Allergen Reactions  . 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 Hctz [Hydrochlorothiazide]     Family History: Family History  Problem Relation Age of Onset  . Anxiety disorder Mother   . Depression Mother     Social History: Social History   Tobacco Use  . Smoking status: Never Smoker  . Smokeless tobacco: Never Used  Substance Use Topics  . Alcohol use: No  . Drug use: No   Social History   Social History Narrative   Lives with wife in a 2 story home.  Has 3 children.  Retired Furniture conservator/restorer.  Education: Media planner.     Review of Systems:  CONSTITUTIONAL: No fevers, chills, night sweats, or weight loss.   EYES: No visual changes or eye pain ENT: No hearing changes.  No history of nose bleeds.   RESPIRATORY: No cough, wheezing and shortness of breath.   CARDIOVASCULAR: Negative for chest pain, and palpitations.   GI: Negative for abdominal discomfort, blood in stools or black stools.  No recent change in bowel habits.   GU:  No history of incontinence.   MUSCLOSKELETAL: No history of joint pain or swelling.  No myalgias.   SKIN: Negative for lesions, rash, and itching.   HEMATOLOGY/ONCOLOGY: Negative for prolonged bleeding, bruising easily, and swollen nodes.  No history of cancer.   ENDOCRINE: Negative for cold or heat intolerance, polydipsia or goiter.   PSYCH:  +depression or anxiety symptoms.   NEURO: As Above.   Vital Signs:  BP 140/90   Pulse 80   Ht 6\' 1"  (1.854 m)   Wt 236 lb 2 oz (107.1 kg)   SpO2 97%   BMI 31.15 kg/m    General Medical Exam:   General:  Well appearing, comfortable.  Blunted affect, smiles appropriately. Eyes/ENT: see cranial nerve examination.   Neck: No masses appreciated.  Full range of motion without tenderness.  No carotid bruits. Respiratory:  Clear to auscultation, good air entry  bilaterally.   Cardiac:  Regular rate and rhythm, no murmur.   Extremities:  No deformities, edema, or skin discoloration.  Skin:  No rashes or lesions.  Neurological Exam: MENTAL STATUS including orientation to time, place, person, recent and remote memory, attention span and concentration, language, and fund of knowledge is normal.  Speech is not dysarthric.  He engages in conversation well, cheerful affect, and has good eye contact.  Montreal Cognitive Assessment  03/19/2018  Visuospatial/ Executive (0/5) 3  *poor effort  Naming (0/3) 3  Attention: Read list of digits (0/2) 2  Attention: Read list of letters (0/1) 1  Attention: Serial 7 subtraction starting at 100 (0/3) 1  Language: Repeat phrase (0/2) 2  Language : Fluency (0/1) 1  Abstraction (0/2) 2  Delayed Recall (0/5) 3  Orientation (0/6) 6  Total 24  Adjusted Score (based on education) 24    CRANIAL NERVES: II:  No visual field defects.  Unremarkable  fundi.   III-IV-VI: Pupils equal round and reactive to light.  Normal conjugate, extra-ocular eye movements in all directions of gaze.  No nystagmus.  No ptosis.   V:  Normal facial sensation.  Jaw jerk is absent.   VII:  Normal facial symmetry and movements.  No pathologic facial reflexes.  VIII:  Normal hearing and vestibular function.   IX-X:  Normal palatal movement.   XI:  Normal shoulder shrug and head rotation.   XII:  Normal tongue strength and range of motion, no deviation or fasciculation.  MOTOR:  No atrophy, fasciculations or abnormal movements.  No pronator drift.  Tone is normal.    Right Upper Extremity:    Left Upper Extremity:    Deltoid  5/5   Deltoid  5/5   Biceps  5/5   Biceps  5/5   Triceps  5/5   Triceps  5/5   Wrist extensors  5/5   Wrist extensors  5/5   Wrist flexors  5/5   Wrist flexors  5/5   Finger extensors  5/5   Finger extensors  5/5   Finger flexors  5/5   Finger flexors  5/5   Dorsal interossei  5/5   Dorsal interossei  5/5   Abductor  pollicis  5/5   Abductor pollicis  5/5   Tone (Ashworth scale)  0  Tone (Ashworth scale)  0   Right Lower Extremity:    Left Lower Extremity:    Hip flexors  5/5   Hip flexors  5/5   Hip extensors  5/5   Hip extensors  5/5   Knee flexors  5/5   Knee flexors  5/5   Knee extensors  5/5   Knee extensors  5/5   Dorsiflexors  5/5   Dorsiflexors  5/5   Plantarflexors  5/5   Plantarflexors  5/5   Toe extensors  5/5   Toe extensors  5/5   Toe flexors  5/5   Toe flexors  5/5   Tone (Ashworth scale)  0  Tone (Ashworth scale)  0   MSRs:  Right                                                                 Left brachioradialis 2+  brachioradialis 2+  biceps 2+  biceps 2+  triceps 2+  triceps 2+  patellar 2+  patellar 2+  ankle jerk 0  ankle jerk 0  Hoffman no  Hoffman no  plantar response down  plantar response down   SENSORY:  Vibration is reduced to 50% at the ankles, absent at the great toe.  Temperature, pin prick, and light touch reduced distal to ankles.  Sensation intact to all modalities in the arms.  Romberg's sign absent.   COORDINATION/GAIT: Normal finger-to- nose-finger.  Reduced amplitude of finger tapping bilaterally, rate is intact.   Able to rise from a chair without using arms.  Gait narrow based and stable. Stressed gait intact. Mild unsteadiness with tandem gait, but able to perform.   IMPRESSION: 1.  Idiopathic peripheral neuropathy.  He has long history of painful neuropathy which has been confirmed by NCS/EMG.  His neurological examination shows a distal predominant small and large fiber peripheral neuropathy. I had extensive discussion with the  patient regarding the pathogenesis, etiology, management, and natural course of neuropathy. Neuropathy tends to be slowly progressive, especially if a treatable etiology is not identified. He had extensive evaluation for treatable causes of neuropathy in the past, which has been unrevealing.  I discussed that in the vast majority of  cases, despite checking for reversible causes, we are unable to find the underlying etiology and management is symptomatic.  For neuropathic pain, he is followed closely by pain management.  From a general neuropathy standpoint, I educated patient on fall precautions and instructed him to inspect his feet daily.  2.  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  Reassured patient that he has no signs progressive neurodegenerative condition such a dementia or prion disease.  His mild cognitive deficits are due to underlying mood disorder, which have improved since getting transcranial magnetic stimulation.  He will continue to follow-up with psychiatry and treatment sessions as scheduled.  No further neurological evaluation is needed.  Return to clinic in 1 year   Thank you for allowing me to participate in patient's care.  If I can answer any additional questions, I would be pleased to do so.    Sincerely,     K. Posey Pronto, DO

## 2018-03-19 NOTE — Progress Notes (Signed)
Patient reported to [facility] for Repetitive Transcranial Magnetic Stimulation treatment for severe episode of recurrent major depressive disorder, without psychotic features. Patient presented with appropriate affect, level mood and denied any suicidal or homicidal ideations. Patient denies any other current symptoms and remains optimistic with continued [place] treatment. Patient reported no change in alcohol/substane use, caffeine consumption, sleep pattern or metal implant status since previous treatment. Ptpresented withpleasantaffect to txsessionaccompanied by his wife.He has an appointment with the neurologist after [place] session. He watched TV during session and periodically spoke to Probation officer and his wife.Power levelwas at 120%for the duration of the tx.Patient reported nocomplaints anddiscomfort during tx.Patientdeparted post-treatment with no majorreported concerns.

## 2018-03-22 ENCOUNTER — Other Ambulatory Visit (INDEPENDENT_AMBULATORY_CARE_PROVIDER_SITE_OTHER): Payer: Medicare HMO | Admitting: Emergency Medicine

## 2018-03-22 DIAGNOSIS — R69 Illness, unspecified: Secondary | ICD-10-CM | POA: Diagnosis not present

## 2018-03-22 DIAGNOSIS — F332 Major depressive disorder, recurrent severe without psychotic features: Secondary | ICD-10-CM | POA: Diagnosis not present

## 2018-03-22 NOTE — Progress Notes (Signed)
Patient reported to [facility] for Repetitive Transcranial Magnetic Stimulation treatment for severe episode of recurrent major depressive disorder, without psychotic features. Patient presented with appropriate affect, level mood and denied any suicidal or homicidal ideations. Patient denies any other current symptoms and remains optimistic with continued [place] treatment. Patient reported no change in alcohol/substane use, caffeine consumption, sleep pattern or metal implant status since previous treatment. Ptpresented withpleasantaffect to txsessionaccompanied byhis wife. He noticed that he is less agitated with others and he is motivated to complete yard work. He watched TV during session and periodically spoke to Probation officer and his wife.Power levelwas at 120%for the duration of the tx.Patient reported nocomplaints anddiscomfort during tx.Patientdeparted post-treatment with no majorreported concerns.

## 2018-03-23 ENCOUNTER — Encounter (HOSPITAL_COMMUNITY): Payer: Self-pay

## 2018-03-23 DIAGNOSIS — Z6831 Body mass index (BMI) 31.0-31.9, adult: Secondary | ICD-10-CM | POA: Diagnosis not present

## 2018-03-23 DIAGNOSIS — R509 Fever, unspecified: Secondary | ICD-10-CM | POA: Diagnosis not present

## 2018-03-24 ENCOUNTER — Other Ambulatory Visit (INDEPENDENT_AMBULATORY_CARE_PROVIDER_SITE_OTHER): Payer: Medicare HMO | Admitting: Emergency Medicine

## 2018-03-24 DIAGNOSIS — F332 Major depressive disorder, recurrent severe without psychotic features: Secondary | ICD-10-CM

## 2018-03-24 DIAGNOSIS — R69 Illness, unspecified: Secondary | ICD-10-CM | POA: Diagnosis not present

## 2018-03-24 NOTE — Progress Notes (Signed)
Patient reported to [facility] for Repetitive Transcranial Magnetic Stimulation treatment for severe episode of recurrent major depressive disorder, without psychotic features. Patient presented with appropriate affect, level mood and denied any suicidal or homicidal ideations. Patient denies any other current symptoms and remains optimistic with continued [place] treatment. Patient reported no change in alcohol/substane use, caffeine consumption, sleep pattern or metal implant status since previous treatment. Ptpresented withpleasantaffect to txsessionaccompanied byhis wife. He did not report to treatment yesterday because he was not feeling well. He found a tick on the back of his head. He watched TV during tx session and periodically spoke to Probation officer.Power levelwas at 120%for the duration of the tx.Patient reported nocomplaints anddiscomfort during tx.Patientdeparted post-treatment with no majorreported concerns.

## 2018-03-25 ENCOUNTER — Other Ambulatory Visit (INDEPENDENT_AMBULATORY_CARE_PROVIDER_SITE_OTHER): Payer: Medicare HMO | Admitting: Emergency Medicine

## 2018-03-25 DIAGNOSIS — F332 Major depressive disorder, recurrent severe without psychotic features: Secondary | ICD-10-CM

## 2018-03-25 DIAGNOSIS — R69 Illness, unspecified: Secondary | ICD-10-CM | POA: Diagnosis not present

## 2018-03-25 NOTE — Progress Notes (Signed)
Patient reported to [facility] for Repetitive Transcranial Magnetic Stimulation treatment for severe episode of recurrent major depressive disorder, without psychotic features. Patient presented with appropriate affect, level mood and denied any suicidal or homicidal ideations. Patient denies any other current symptoms and remains optimistic with continued [place] treatment. Patient reported no change in alcohol/substane use, caffeine consumption, sleep pattern or metal implant status since previous treatment. Ptpresented withpleasantaffect to txsessionaccompanied byhis wife. He reported that he has started to drive again. He feels comfortable to drive himself.  He watched TV during tx session and periodically spoke to Probation officer and wife.Power levelwas at 120%for the duration of the tx.Patient reported nocomplaints anddiscomfort during tx.Patientdeparted post-treatment with no majorreported concerns.

## 2018-03-26 ENCOUNTER — Other Ambulatory Visit (INDEPENDENT_AMBULATORY_CARE_PROVIDER_SITE_OTHER): Payer: Medicare HMO | Admitting: Emergency Medicine

## 2018-03-26 DIAGNOSIS — F332 Major depressive disorder, recurrent severe without psychotic features: Secondary | ICD-10-CM

## 2018-03-26 DIAGNOSIS — R69 Illness, unspecified: Secondary | ICD-10-CM | POA: Diagnosis not present

## 2018-03-26 NOTE — Progress Notes (Signed)
Patient reported to [facility] for Repetitive Transcranial Magnetic Stimulation treatment for severe episode of recurrent major depressive disorder, without psychotic features. Patient presented with appropriate affect, level mood and denied any suicidal or homicidal ideations. Patient denies any other current symptoms and remains optimistic with continued [place] treatment. Patient reported no change in alcohol/substane use, caffeine consumption, sleep pattern or metal implant status since previous treatment. Ptpresented withpleasantaffect to txsessionaccompanied byhis wife. He shared that he attended a softball game last night. He is noticing the positive affects of [place] - attending social events and interacting with people. He watched TV during tx session.Power levelwas at 120%for the duration of the tx.Patient reported nocomplaints anddiscomfort during tx.Patientdeparted post-treatment with no majorreported concerns.

## 2018-03-29 ENCOUNTER — Encounter (HOSPITAL_COMMUNITY): Payer: Self-pay

## 2018-03-30 ENCOUNTER — Other Ambulatory Visit (INDEPENDENT_AMBULATORY_CARE_PROVIDER_SITE_OTHER): Payer: Medicare HMO | Admitting: Emergency Medicine

## 2018-03-30 DIAGNOSIS — F332 Major depressive disorder, recurrent severe without psychotic features: Secondary | ICD-10-CM

## 2018-03-30 DIAGNOSIS — R69 Illness, unspecified: Secondary | ICD-10-CM | POA: Diagnosis not present

## 2018-03-30 NOTE — Progress Notes (Signed)
Patient reported to [facility] for Repetitive Transcranial Magnetic Stimulation treatment for severe episode of recurrent major depressive disorder, without psychotic features. Patient presented with appropriate affect, level mood and denied any suicidal or homicidal ideations. Patient denies any other current symptoms and remains optimistic with continued [place] treatment. Patient reported no change in alcohol/substane use, caffeine consumption, sleep pattern or metal implant status since previous treatment. Ptpresented withpleasantaffect to txsession. He shared that he had a  long day in the yard yesterday. He is extremely tired. He fell asleep 3 times during tx - writer woke him up.Power levelwas at 120%for the duration of the tx.Patient reported nocomplaints anddiscomfort during tx.Patientdeparted post-treatment with no majorreported concerns.

## 2018-03-31 ENCOUNTER — Other Ambulatory Visit (INDEPENDENT_AMBULATORY_CARE_PROVIDER_SITE_OTHER): Payer: Medicare HMO | Admitting: Emergency Medicine

## 2018-03-31 DIAGNOSIS — R69 Illness, unspecified: Secondary | ICD-10-CM | POA: Diagnosis not present

## 2018-03-31 DIAGNOSIS — F332 Major depressive disorder, recurrent severe without psychotic features: Secondary | ICD-10-CM | POA: Diagnosis not present

## 2018-03-31 NOTE — Progress Notes (Signed)
Patient reported to [facility] for Repetitive Transcranial Magnetic Stimulation treatment for severe episode of recurrent major depressive disorder, without psychotic features. Patient presented with appropriate affect, level mood and denied any suicidal or homicidal ideations. Patient denies any other current symptoms and remains optimistic with continued [place] treatment. Patient reported no change in alcohol/substane use, caffeine consumption, sleep pattern or metal implant status since previous treatment. Ptpresented withpleasantaffect to txsession accompanied by his wife. He had a good nights rest and slept for over 8 hours. He watched TV during tx session and spoke with Probation officer and wife.Power levelwas at 120%for the duration of the tx.Patient reported nocomplaints anddiscomfort during tx.Patientdeparted post-treatment with no majorreported concerns.

## 2018-04-01 ENCOUNTER — Other Ambulatory Visit (INDEPENDENT_AMBULATORY_CARE_PROVIDER_SITE_OTHER): Payer: Medicare HMO | Admitting: Emergency Medicine

## 2018-04-01 DIAGNOSIS — F332 Major depressive disorder, recurrent severe without psychotic features: Secondary | ICD-10-CM

## 2018-04-01 DIAGNOSIS — R69 Illness, unspecified: Secondary | ICD-10-CM | POA: Diagnosis not present

## 2018-04-01 NOTE — Progress Notes (Signed)
Patient reported to [facility] for Repetitive Transcranial Magnetic Stimulation treatment for severe episode of recurrent major depressive disorder, without psychotic features. Patient presented with appropriate affect, level mood and denied any suicidal or homicidal ideations. Patient denies any other current symptoms and remains optimistic with continued [place] treatment. Patient reported no change in alcohol/substane use, caffeine consumption, sleep pattern or metal implant status since previous treatment. Ptpresented withpleasantaffect to txsession accompanied by his wife. He was sharing funny stories of his dogs. He also shared that his energy level has increased - spends a lot of time completing yard work. He was talkative this morning. He spoke with wife and Probation officer and periodically watched TV.Power levelwas at 120%for the duration of the tx.Patient reported nocomplaints anddiscomfort during tx.Patientdeparted post-treatment with no majorreported concerns.

## 2018-04-02 ENCOUNTER — Other Ambulatory Visit (INDEPENDENT_AMBULATORY_CARE_PROVIDER_SITE_OTHER): Payer: Medicare HMO | Admitting: Emergency Medicine

## 2018-04-02 DIAGNOSIS — R69 Illness, unspecified: Secondary | ICD-10-CM | POA: Diagnosis not present

## 2018-04-02 DIAGNOSIS — F332 Major depressive disorder, recurrent severe without psychotic features: Secondary | ICD-10-CM | POA: Diagnosis not present

## 2018-04-02 NOTE — Progress Notes (Signed)
Patient reported to [facility] for Repetitive Transcranial Magnetic Stimulation treatment for severe episode of recurrent major depressive disorder, without psychotic features. Patient presented with appropriate affect, level mood and denied any suicidal or homicidal ideations. Patient denies any other current symptoms and remains optimistic with continued [place] treatment. Patient reported no change in alcohol/substane use, caffeine consumption, sleep pattern or metal implant status since previous treatment. Ptpresented withpleasantaffect to txsession accompanied by his wife. He shared stories about his family and dogs. He was full of energy.  He watched TV during tx session and spoke with Probation officer and wife.Power levelwas at 120%for the duration of the tx.Patient reported nocomplaints anddiscomfort during tx.Patientdeparted post-treatment with no majorreported concerns.

## 2018-04-05 ENCOUNTER — Encounter (HOSPITAL_COMMUNITY): Payer: Self-pay

## 2018-04-05 ENCOUNTER — Encounter (HOSPITAL_COMMUNITY): Payer: Self-pay | Admitting: Emergency Medicine

## 2018-04-05 DIAGNOSIS — G894 Chronic pain syndrome: Secondary | ICD-10-CM | POA: Diagnosis not present

## 2018-04-05 DIAGNOSIS — Z79891 Long term (current) use of opiate analgesic: Secondary | ICD-10-CM | POA: Diagnosis not present

## 2018-04-05 DIAGNOSIS — M5090 Cervical disc disorder, unspecified, unspecified cervical region: Secondary | ICD-10-CM | POA: Diagnosis not present

## 2018-04-05 DIAGNOSIS — G629 Polyneuropathy, unspecified: Secondary | ICD-10-CM | POA: Diagnosis not present

## 2018-04-05 DIAGNOSIS — R69 Illness, unspecified: Secondary | ICD-10-CM | POA: Diagnosis not present

## 2018-04-06 ENCOUNTER — Other Ambulatory Visit (HOSPITAL_COMMUNITY): Payer: Medicare HMO | Attending: Psychiatry | Admitting: Emergency Medicine

## 2018-04-06 DIAGNOSIS — R69 Illness, unspecified: Secondary | ICD-10-CM | POA: Diagnosis not present

## 2018-04-06 DIAGNOSIS — F332 Major depressive disorder, recurrent severe without psychotic features: Secondary | ICD-10-CM | POA: Diagnosis not present

## 2018-04-06 NOTE — Progress Notes (Signed)
Patient reported to [facility] for Repetitive Transcranial Magnetic Stimulation treatment for severe episode of recurrent major depressive disorder, without psychotic features. Patient presented with appropriate affect, level mood and denied any suicidal or homicidal ideations. Patient denies any other current symptoms and remains optimistic with continued [place] treatment. Patient reported no change in alcohol/substane use, caffeine consumption, sleep pattern or metal implant status since previous treatment. Ptpresented withpleasantaffect to txsessionaccompanied by his wife.  He shared that he limits his social isolation and has found himself to be more social. He watched TV during tx session and spoke with Probation officer and wife.Power levelwas at 120%for the duration of the tx.Patient reported nocomplaints anddiscomfort during tx.Patientdeparted post-treatment with no majorreported concerns.

## 2018-04-07 ENCOUNTER — Other Ambulatory Visit (INDEPENDENT_AMBULATORY_CARE_PROVIDER_SITE_OTHER): Payer: Medicare HMO | Admitting: Emergency Medicine

## 2018-04-07 DIAGNOSIS — F332 Major depressive disorder, recurrent severe without psychotic features: Secondary | ICD-10-CM

## 2018-04-07 DIAGNOSIS — R69 Illness, unspecified: Secondary | ICD-10-CM | POA: Diagnosis not present

## 2018-04-07 NOTE — Progress Notes (Signed)
Patient reported to [facility] for Repetitive Transcranial Magnetic Stimulation treatment for severe episode of recurrent major depressive disorder, without psychotic features. Patient presented with appropriate affect, level mood and denied any suicidal or homicidal ideations. Patient denies any other current symptoms and remains optimistic with continued [place] treatment. Patient reported no change in alcohol/substane use, caffeine consumption, sleep pattern or metal implant status since previous treatment. Ptpresented withpleasantaffect to txsession. He shared that his wife is not feeling well, so he attended appointment alone. He shared stories about his children. Also, he watched TV during tx session and periodically spoke with Probation officer. Power levelwas at 120%for the duration of the tx.Patient reported nocomplaints anddiscomfort during tx.Patientdeparted post-treatment with no majorreported concerns.

## 2018-04-08 ENCOUNTER — Other Ambulatory Visit (INDEPENDENT_AMBULATORY_CARE_PROVIDER_SITE_OTHER): Payer: Medicare HMO | Admitting: Emergency Medicine

## 2018-04-08 ENCOUNTER — Encounter (HOSPITAL_COMMUNITY): Payer: Self-pay

## 2018-04-08 DIAGNOSIS — R69 Illness, unspecified: Secondary | ICD-10-CM | POA: Diagnosis not present

## 2018-04-08 DIAGNOSIS — F332 Major depressive disorder, recurrent severe without psychotic features: Secondary | ICD-10-CM

## 2018-04-08 NOTE — Progress Notes (Signed)
Patient reported to [facility] for Repetitive Transcranial Magnetic Stimulation treatment for severe episode of recurrent major depressive disorder, without psychotic features. Patient presented with appropriate affect, level mood and denied any suicidal or homicidal ideations. Patient denies any other current symptoms and remains optimistic with continued [place] treatment. Patient reported no change in alcohol/substane use, caffeine consumption, sleep pattern or metal implant status since previous treatment. Ptpresented withpleasantaffect to txsession with his wife. He talked about current events and watched TV during tx session. He periodically talked to his wife and Probation officer. Power levelwas at 120%for the duration of the tx.Patient reported nocomplaints anddiscomfort during tx.Patientdeparted post-treatment with no majorreported concerns.

## 2018-04-09 ENCOUNTER — Encounter (HOSPITAL_COMMUNITY): Payer: Self-pay

## 2018-04-09 ENCOUNTER — Other Ambulatory Visit (INDEPENDENT_AMBULATORY_CARE_PROVIDER_SITE_OTHER): Payer: Medicare HMO | Admitting: Emergency Medicine

## 2018-04-09 DIAGNOSIS — F332 Major depressive disorder, recurrent severe without psychotic features: Secondary | ICD-10-CM | POA: Diagnosis not present

## 2018-04-09 DIAGNOSIS — R69 Illness, unspecified: Secondary | ICD-10-CM | POA: Diagnosis not present

## 2018-04-09 NOTE — Progress Notes (Signed)
Patient reported to [facility] for Repetitive Transcranial Magnetic Stimulation treatment for severe episode of recurrent major depressive disorder, without psychotic features. Patient presented with appropriate affect, level mood and denied any suicidal or homicidal ideations. Patient denies any other current symptoms and remains optimistic with continued [place] treatment. Patient reported no change in alcohol/substane use, caffeine consumption, sleep pattern or metal implant status since previous treatment. Ptpresented withpleasantaffect to txsession.He was quiet today. He reported that he did not sleep well last night. He periodically talked to Probation officer and watched TV. Power levelwas at 120%for the duration of the tx.Patient reported nocomplaints anddiscomfort during tx.Patientdeparted post-treatment with no majorreported concerns.

## 2018-04-12 ENCOUNTER — Other Ambulatory Visit (HOSPITAL_COMMUNITY): Payer: Medicare HMO | Admitting: Emergency Medicine

## 2018-04-12 DIAGNOSIS — F332 Major depressive disorder, recurrent severe without psychotic features: Secondary | ICD-10-CM

## 2018-04-12 NOTE — Progress Notes (Signed)
Patient reported to [facility] for Repetitive Transcranial Magnetic Stimulation treatment for severe episode of recurrent major depressive disorder, without psychotic features. Patient presented with appropriate affect, level mood and denied any suicidal or homicidal ideations. Patient denies any other current symptoms and remains optimistic with continued [place] treatment. Patient reported no change in alcohol/substane use, caffeine consumption, sleep pattern or metal implant status since previous treatment. Ptpresented withpleasantaffect to txsession.He talked about current events and watched TV during tx session. She shared that his wife is going to on vacation and he plans to spend a lot of time with his dogs. He periodically talked to  Probation officer. Power levelwas at 120%for the duration of the tx.Patient reported nocomplaints anddiscomfort during tx.Patientdeparted post-treatment with no majorreported concerns.

## 2018-04-13 ENCOUNTER — Other Ambulatory Visit (INDEPENDENT_AMBULATORY_CARE_PROVIDER_SITE_OTHER): Payer: Medicare HMO | Admitting: Emergency Medicine

## 2018-04-13 DIAGNOSIS — F332 Major depressive disorder, recurrent severe without psychotic features: Secondary | ICD-10-CM

## 2018-04-13 DIAGNOSIS — R69 Illness, unspecified: Secondary | ICD-10-CM | POA: Diagnosis not present

## 2018-04-13 NOTE — Progress Notes (Signed)
Patient reported to [facility] for Repetitive Transcranial Magnetic Stimulation treatment for severe episode of recurrent major depressive disorder, without psychotic features. Patient presented with appropriate affect, level mood and denied any suicidal or homicidal ideations. Patient denies any other current symptoms and remains optimistic with continued [place] treatment. Patient reported no change in alcohol/substane use, caffeine consumption, sleep pattern or metal implant status since previous treatment. Ptpresented withpleasantaffect to txsession with his wife. He shared that he had a great evening watching the [employer]. He plans on working in the yard after treatment session. He watched TV during tx session and periodically spoke with wife and Probation officer.Power levelwas at 120%for the duration of the tx.Patient reported nocomplaints anddiscomfort during tx.Patientdeparted post-treatment with no majorreported concerns.

## 2018-04-14 ENCOUNTER — Other Ambulatory Visit (INDEPENDENT_AMBULATORY_CARE_PROVIDER_SITE_OTHER): Payer: Medicare HMO | Admitting: Emergency Medicine

## 2018-04-14 DIAGNOSIS — F332 Major depressive disorder, recurrent severe without psychotic features: Secondary | ICD-10-CM

## 2018-04-14 NOTE — Progress Notes (Signed)
Patient reported to [facility] for Repetitive Transcranial Magnetic Stimulation treatment for severe episode of recurrent major depressive disorder, without psychotic features. Patient presented with appropriate affect, level mood and denied any suicidal or homicidal ideations. Patient denies any other current symptoms and remains optimistic with continued [place] treatment. Patient reported no change in alcohol/substane use, caffeine consumption, sleep pattern or metal implant status since previous treatment. Ptpresented withpleasantaffect to txsession with his wife.He talked about current events andwatched TV during tx session.She shared that he has been having more energy to complete yard work this afternoon. TMS has been contributing to his energy level. Power levelwas at 120%for the duration of the tx.Patient reported nocomplaints anddiscomfort during tx.Patientdeparted post-treatment with no majorreported concerns.

## 2018-04-15 ENCOUNTER — Other Ambulatory Visit (INDEPENDENT_AMBULATORY_CARE_PROVIDER_SITE_OTHER): Payer: Medicare HMO | Admitting: Emergency Medicine

## 2018-04-15 DIAGNOSIS — F332 Major depressive disorder, recurrent severe without psychotic features: Secondary | ICD-10-CM

## 2018-04-15 DIAGNOSIS — R69 Illness, unspecified: Secondary | ICD-10-CM | POA: Diagnosis not present

## 2018-04-15 NOTE — Progress Notes (Signed)
Patient reported to [facility] for Repetitive Transcranial Magnetic Stimulation treatment for severe episode of recurrent major depressive disorder, without psychotic features. Patient presented with appropriate affect, level mood and denied any suicidal or homicidal ideations. Patient denies any other current symptoms and remains optimistic with continued [place] treatment. Patient reported no change in alcohol/substane use, caffeine consumption, sleep pattern or metal implant status since previous treatment. Ptpresented withpleasantaffect to txsession.He will be spending timw with his dogs while his wife is out of town for the day. He shared that his dogs have a positive impact on his mood. He watched TV during tx session and  periodically talked to  Probation officer.Power levelwas at 120%for the duration of the tx.Patient reported nocomplaints anddiscomfort during tx.Patientdeparted post-treatment with no majorreported concerns.

## 2018-04-19 ENCOUNTER — Other Ambulatory Visit (INDEPENDENT_AMBULATORY_CARE_PROVIDER_SITE_OTHER): Payer: Medicare HMO | Admitting: Emergency Medicine

## 2018-04-19 DIAGNOSIS — F332 Major depressive disorder, recurrent severe without psychotic features: Secondary | ICD-10-CM | POA: Diagnosis not present

## 2018-04-19 DIAGNOSIS — R69 Illness, unspecified: Secondary | ICD-10-CM | POA: Diagnosis not present

## 2018-04-19 NOTE — Progress Notes (Signed)
Patient reported to [facility] for Repetitive Transcranial Magnetic Stimulation treatment for severe episode of recurrent major depressive disorder, without psychotic features. Patient presented with appropriate affect, level mood and denied any suicidal or homicidal ideations. Patient denies any other current symptoms and remains optimistic with continued [place] treatment. Patient reported no change in alcohol/substane use, caffeine consumption, sleep pattern or metal implant status since previous treatment. Ptpresented withpleasantaffect to tx session. He had a great Father's Day weekend. He showed pictures of his family and yard work he has been working on the past couple of weeks. TMS has been contributing to his energy level. He talked to Probation officer and periodically watched TV. Power levelwas at 120%for the duration of the tx.Patient reported nocomplaints anddiscomfort during tx.Patientdeparted post-treatment with no majorreported concerns.

## 2018-04-20 ENCOUNTER — Other Ambulatory Visit (INDEPENDENT_AMBULATORY_CARE_PROVIDER_SITE_OTHER): Payer: Medicare HMO | Admitting: Emergency Medicine

## 2018-04-20 ENCOUNTER — Other Ambulatory Visit (HOSPITAL_COMMUNITY): Payer: Self-pay | Admitting: Psychiatry

## 2018-04-20 DIAGNOSIS — F332 Major depressive disorder, recurrent severe without psychotic features: Secondary | ICD-10-CM

## 2018-04-20 DIAGNOSIS — F333 Major depressive disorder, recurrent, severe with psychotic symptoms: Secondary | ICD-10-CM

## 2018-04-20 DIAGNOSIS — R69 Illness, unspecified: Secondary | ICD-10-CM | POA: Diagnosis not present

## 2018-04-20 NOTE — Progress Notes (Signed)
Patient reported to [facility] for Repetitive Transcranial Magnetic Stimulation treatment for severe episode of recurrent major depressive disorder, without psychotic features. Patient presented with appropriate affect, level mood and denied any suicidal or homicidal ideations. Patient denies any other current symptoms and remains optimistic with continued [place] treatment. Patient reported no change in alcohol/substane use, caffeine consumption, sleep pattern or metal implant status since previous treatment. Ptpresented withpleasantaffect to txsession with wife. He watched TV during tx session and periodically talked to Probation officer and wife.Power levelwas at 120%for the duration of the tx.Patient reported nocomplaints anddiscomfort during tx.Patientdeparted post-treatment with no majorreported concerns.

## 2018-04-21 ENCOUNTER — Other Ambulatory Visit (INDEPENDENT_AMBULATORY_CARE_PROVIDER_SITE_OTHER): Payer: Medicare HMO | Admitting: Emergency Medicine

## 2018-04-21 DIAGNOSIS — F332 Major depressive disorder, recurrent severe without psychotic features: Secondary | ICD-10-CM | POA: Diagnosis not present

## 2018-04-21 DIAGNOSIS — R69 Illness, unspecified: Secondary | ICD-10-CM | POA: Diagnosis not present

## 2018-04-21 NOTE — Progress Notes (Addendum)
Patient reported to [facility] for Repetitive Transcranial Magnetic Stimulation treatment for severe episode of recurrent major depressive disorder, without psychotic features. Patient presented with appropriate affect, level mood and denied any suicidal or homicidal ideations. Patient denies any other current symptoms and remains optimistic with continued [place] treatment. Patient reported no change in alcohol/substane use, caffeine consumption, sleep pattern or metal implant status since previous treatment. Ptpresented withpleasantaffect to tx session. He plans on spending time with his dogs this afternoon. His bond with his dogs has been helping him decrease depression symptoms. He talked to Probation officer and periodically watched TV. Power levelwas at 120%for the duration of the tx.Patient reported nocomplaints anddiscomfort during tx.Patientdeparted post-treatment with no majorreported concerns.

## 2018-04-22 ENCOUNTER — Other Ambulatory Visit (INDEPENDENT_AMBULATORY_CARE_PROVIDER_SITE_OTHER): Payer: Medicare HMO | Admitting: Emergency Medicine

## 2018-04-22 DIAGNOSIS — R69 Illness, unspecified: Secondary | ICD-10-CM | POA: Diagnosis not present

## 2018-04-22 DIAGNOSIS — F332 Major depressive disorder, recurrent severe without psychotic features: Secondary | ICD-10-CM | POA: Diagnosis not present

## 2018-04-22 NOTE — Progress Notes (Signed)
Patient reported to [facility] for Repetitive Transcranial Magnetic Stimulation treatment for severe episode of recurrent major depressive disorder, without psychotic features. Patient presented with appropriate affect, level mood and denied any suicidal or homicidal ideations. Patient denies any other current symptoms and remains optimistic with continued [place] treatment. Patient reported no change in alcohol/substane use, caffeine consumption, sleep pattern or metal implant status since previous treatment. Ptpresented withpleasantaffect totx session. He plans on completing yard work this afternoon. He is excited that he has been having energy to get things done. He talked to Probation officer and periodically watched TV.Power levelwas at 120%for the duration of the tx.Patient reported nocomplaints anddiscomfort during tx.Patientdeparted post-treatment with no majorreported concerns.

## 2018-04-26 ENCOUNTER — Encounter (HOSPITAL_COMMUNITY): Payer: Self-pay

## 2018-04-27 ENCOUNTER — Other Ambulatory Visit (INDEPENDENT_AMBULATORY_CARE_PROVIDER_SITE_OTHER): Payer: Medicare HMO | Admitting: Emergency Medicine

## 2018-04-27 DIAGNOSIS — F332 Major depressive disorder, recurrent severe without psychotic features: Secondary | ICD-10-CM

## 2018-04-27 DIAGNOSIS — R69 Illness, unspecified: Secondary | ICD-10-CM | POA: Diagnosis not present

## 2018-04-27 NOTE — Progress Notes (Signed)
Patient reported to [facility] for Repetitive Transcranial Magnetic Stimulation treatment for severe episode of recurrent major depressive disorder, without psychotic features. Patient presented with appropriate affect, level mood and denied any suicidal or homicidal ideations. Patient denies any other current symptoms and remains optimistic with continued [place] treatment. Patient reported no change in alcohol/substane use, caffeine consumption, sleep pattern or metal implant status since previous treatment. Ptpresented withpleasantaffect totx session. He has been spending time with his dogs and completing yardwork these past couple of day. He talked to Probation officer and periodically watched TV.Power levelwas at 120%for the duration of the tx.Patient reported nocomplaints anddiscomfort during tx.Patientdeparted post-treatment with no majorreported concerns.

## 2018-04-28 ENCOUNTER — Other Ambulatory Visit (INDEPENDENT_AMBULATORY_CARE_PROVIDER_SITE_OTHER): Payer: Medicare HMO | Admitting: Emergency Medicine

## 2018-04-28 DIAGNOSIS — R69 Illness, unspecified: Secondary | ICD-10-CM | POA: Diagnosis not present

## 2018-04-28 DIAGNOSIS — F332 Major depressive disorder, recurrent severe without psychotic features: Secondary | ICD-10-CM

## 2018-04-28 NOTE — Progress Notes (Signed)
Patient reported to [facility] for Repetitive Transcranial Magnetic Stimulation treatment for severe episode of recurrent major depressive disorder, without psychotic features. Patient presented with appropriate affect, level mood and denied any suicidal or homicidal ideations. Patient denies any other current symptoms and remains optimistic with continued [place] treatment. Patient reported no change in alcohol/substane use, caffeine consumption, sleep pattern or metal implant status since previous treatment. Ptpresented withpleasantaffect totx session. He plans on cleaning the house this evening before his wife returns from her vacation. He is glad that [place] has been providing him with energy and increased motivation levels.He talked to Probation officer and periodically watched TV.Power levelwas at 120%for the duration of the tx.Patient reported nocomplaints anddiscomfort during tx.Patientdeparted post-treatment with no majorreported concerns.

## 2018-04-29 ENCOUNTER — Ambulatory Visit (INDEPENDENT_AMBULATORY_CARE_PROVIDER_SITE_OTHER): Payer: Medicare HMO | Admitting: Psychiatry

## 2018-04-29 ENCOUNTER — Encounter (HOSPITAL_COMMUNITY): Payer: Self-pay | Admitting: Psychiatry

## 2018-04-29 DIAGNOSIS — F331 Major depressive disorder, recurrent, moderate: Secondary | ICD-10-CM

## 2018-04-29 DIAGNOSIS — R69 Illness, unspecified: Secondary | ICD-10-CM | POA: Diagnosis not present

## 2018-04-29 DIAGNOSIS — F333 Major depressive disorder, recurrent, severe with psychotic symptoms: Secondary | ICD-10-CM

## 2018-04-29 MED ORDER — AMITRIPTYLINE HCL 50 MG PO TABS: 50.0000 mg | ORAL_TABLET | Freq: Every day | ORAL | 0 refills | Status: DC

## 2018-04-29 MED ORDER — BUSPIRONE HCL 5 MG PO TABS: 5.0000 mg | ORAL_TABLET | Freq: Three times a day (TID) | ORAL | 0 refills | Status: DC

## 2018-04-29 NOTE — Progress Notes (Signed)
[facility] MD/PA/NP OP Progress Note  04/29/2018 10:26 AM Joel Gross  MRN:  [id number]  Chief Complaint: [place], med check  HPI: Joel Gross  feels tremendously improved in terms of mood, energy, and memory, and has tolerated TMS without any significant deleterious effects.  He continues on the psychiatric medication regimen of Cymbalta, amitriptyline, and BuSpar, and feels like he has been able to stay active, tolerate stressors, and sleep well at night.  We agreed to follow-up in 8 weeks or sooner if needed.  Visit Diagnosis:    ICD-10-CM   1. Moderate episode of recurrent major depressive disorder (HCC) F33.1   2. 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 Reviewed, with no updates at this time.   Past Medical History:  Past Medical History:  Diagnosis Date  . Hypertension   . Neuropathy    History reviewed. No pertinent surgical history.  Family Psychiatric History: See intake H&P for full details. Reviewed, with no updates at this time.   Family History:  Family History  Problem Relation Age of Onset  . Anxiety disorder Mother   . Depression Mother     Social History:  Social History   Socioeconomic History  . Marital status: Married    Spouse name: Not on file  . Number of children: 3  . Years of education: 31  . Highest education level: Not on file  Occupational History  . Occupation: retired Aeronautical engineer  . Financial resource strain: Not on file  . Food insecurity:    Worry: Not on file    Inability: Not on file  . Transportation needs:    Medical: Not on file    Non-medical: Not on file  Tobacco Use  . Smoking status: Never Smoker  . Smokeless tobacco: Never Used  Substance and Sexual Activity  . Alcohol use: No  . Drug use: No  . Sexual activity: Yes    Birth control/protection: None   Lifestyle  . Physical activity:    Days per week: Not on file    Minutes per session: Not on file  . Stress: Not on file  Relationships  . Social connections:    Talks on phone: Not on file    Gets together: Not on file    Attends religious service: Not on file    Active member of club or organization: Not on file    Attends meetings of clubs or organizations: Not on file    Relationship status: Not on file  Other Topics Concern  . Not on file  Social History Narrative   Lives with wife in a 2 story home.  Has 3 children.  Retired Furniture conservator/restorer.  Education: Media planner.     Allergies:  Allergies  Allergen Reactions  . 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 Hctz [Hydrochlorothiazide]     Metabolic Disorder Labs: Lab Results  Component Value Date   HGBA1C 5.5 06/20/2017   MPG 111.15 06/20/2017   No results found for: PROLACTIN Lab Results  Component Value Date   CHOL 140 06/20/2017   TRIG 106 06/20/2017   HDL 29 (L) 06/20/2017   CHOLHDL 4.8 06/20/2017   VLDL 21 06/20/2017   LDLCALC 90 06/20/2017   Lab Results  Component Value Date   TSH 0.830 06/20/2017  Therapeutic Level Labs: No results found for: LITHIUM No results found for: VALPROATE No components found for:  CBMZ  Current Medications: Current Outpatient Medications  Medication Sig Dispense Refill  . amitriptyline (ELAVIL) 50 MG tablet Take 1 tablet (50 mg total) by mouth at bedtime. 90 tablet 0  . amLODipine (NORVASC) 10 MG tablet Take 10 mg by mouth daily.    . busPIRone (BUSPAR) 5 MG tablet Take 1 tablet (5 mg total) by mouth 3 (three) times daily. 270 tablet 0  . cyclobenzaprine (FLEXERIL) 10 MG tablet Take 10 mg by mouth at bedtime.    . DULoxetine (CYMBALTA) 60 MG capsule TAKE 2 CAPSULES BY MOUTH DAILY 180 capsule 1  . HYDROcodone-acetaminophen (NORCO) 7.5-325 MG tablet Take 1 tablet by mouth 3 (three) times daily.    Marland Kitchen lisinopril (PRINIVIL,ZESTRIL) 40 MG tablet Take  40 mg by mouth daily.    . potassium chloride SA (K-DUR,KLOR-CON) 20 MEQ tablet Take 20 mEq by mouth 2 (two) times daily.    . pravastatin (PRAVACHOL) 20 MG tablet Take 20 mg by mouth daily.    . pregabalin (LYRICA) 200 MG capsule Take 1 capsule (200 mg total) by mouth 3 (three) times daily. 90 capsule 0   No current facility-administered medications for this visit.      Musculoskeletal: Strength & Muscle Tone: within normal limits Gait & Station: normal Patient leans: N/A  Psychiatric Specialty Exam: ROS  There were no vitals taken for this visit.There is no height or weight on file to calculate BMI.  General Appearance: Casual and Fairly Groomed  Eye Contact:  Good  Speech:  Clear and Coherent  Volume:  Normal  Mood:  Euthymic  Affect:  Appropriate and Congruent  Thought Process:  Coherent, Goal Directed and Descriptions of Associations: Intact  Orientation:  Full (Time, Place, and Person)  Thought Content: Logical   Suicidal Thoughts:  No  Homicidal Thoughts:  No  Memory:  Immediate;   Good  Judgement:  Good  Insight:  Fair  Psychomotor Activity:  Normal  Concentration:  Concentration: Good  Recall:  Good  Fund of Knowledge: Good  Language: Good  Akathisia:  Negative  Handed:  Right  AIMS (if indicated): not done  Assets:  Communication Skills Desire for Improvement Financial Resources/Insurance Housing Social Support Vocational/Educational  ADL's:  Intact  Cognition: WNL  Sleep:  Good   Screenings: AUDIT     Admission (Discharged) from 06/19/2017 in [place]  Alcohol Use Disorder Identification Test Final Score (AUDIT)  0       Assessment and Plan:  Joel Gross  is a 67 year old man with 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  Continues medications as below and we will follow-up in 8 weeks.  1. Moderate episode of  recurrent major depressive disorder ([place])   2. Severe recurrent major depression with psychotic features ([place])     Status of current problems: unchanged  Labs Ordered: No orders of the defined types were placed in this encounter.   Labs Reviewed: n/a  Collateral Obtained/Records Reviewed: n/a  Plan:  buspar 5 mg TID prn cymbalta 120 mg daily Elavil 50 mg nightly for sleep rtc 8 weeks   Aundra Dubin, MD 04/29/2018, 10:26 AM

## 2018-05-03 ENCOUNTER — Other Ambulatory Visit (HOSPITAL_COMMUNITY): Payer: Medicare HMO | Attending: Psychiatry | Admitting: Emergency Medicine

## 2018-05-03 DIAGNOSIS — F332 Major depressive disorder, recurrent severe without psychotic features: Secondary | ICD-10-CM | POA: Diagnosis not present

## 2018-05-03 DIAGNOSIS — R69 Illness, unspecified: Secondary | ICD-10-CM | POA: Diagnosis not present

## 2018-05-03 NOTE — Progress Notes (Signed)
Patient reported to [facility] for Repetitive Transcranial Magnetic Stimulation treatment for severe episode of recurrent major depressive disorder, without psychotic features. Patient presented with appropriate affect, level mood and denied any suicidal or homicidal ideations. Patient denies any other current symptoms and remains optimistic with continued [place] treatment. Patient reported no change in alcohol/substane use, caffeine consumption, sleep pattern or metal implant status since previous treatment. Ptpresented withpleasantaffect totx session.He shared that overall TMS has improved his quality of life. He is grateful of this experience and undergoing this procedure.He talked to Probation officer and periodically watched TV.Power levelwas at 120%for the duration of the tx.Patient reported nocomplaints anddiscomfort during tx.Patientdeparted post-treatment with no majorreported concerns.

## 2018-05-28 DIAGNOSIS — Z79899 Other long term (current) drug therapy: Secondary | ICD-10-CM | POA: Diagnosis not present

## 2018-05-28 DIAGNOSIS — Z1339 Encounter for screening examination for other mental health and behavioral disorders: Secondary | ICD-10-CM | POA: Diagnosis not present

## 2018-05-28 DIAGNOSIS — Z6831 Body mass index (BMI) 31.0-31.9, adult: Secondary | ICD-10-CM | POA: Diagnosis not present

## 2018-05-28 DIAGNOSIS — G629 Polyneuropathy, unspecified: Secondary | ICD-10-CM | POA: Diagnosis not present

## 2018-05-28 DIAGNOSIS — Z125 Encounter for screening for malignant neoplasm of prostate: Secondary | ICD-10-CM | POA: Diagnosis not present

## 2018-05-28 DIAGNOSIS — Z0000 Encounter for general adult medical examination without abnormal findings: Secondary | ICD-10-CM | POA: Diagnosis not present

## 2018-05-28 DIAGNOSIS — Z1331 Encounter for screening for depression: Secondary | ICD-10-CM | POA: Diagnosis not present

## 2018-05-31 DIAGNOSIS — G629 Polyneuropathy, unspecified: Secondary | ICD-10-CM | POA: Diagnosis not present

## 2018-05-31 DIAGNOSIS — G894 Chronic pain syndrome: Secondary | ICD-10-CM | POA: Diagnosis not present

## 2018-05-31 DIAGNOSIS — M5090 Cervical disc disorder, unspecified, unspecified cervical region: Secondary | ICD-10-CM | POA: Diagnosis not present

## 2018-05-31 DIAGNOSIS — Z79891 Long term (current) use of opiate analgesic: Secondary | ICD-10-CM | POA: Diagnosis not present

## 2018-05-31 DIAGNOSIS — R69 Illness, unspecified: Secondary | ICD-10-CM | POA: Diagnosis not present

## 2018-06-03 DIAGNOSIS — D510 Vitamin B12 deficiency anemia due to intrinsic factor deficiency: Secondary | ICD-10-CM | POA: Diagnosis not present

## 2018-06-03 DIAGNOSIS — I6529 Occlusion and stenosis of unspecified carotid artery: Secondary | ICD-10-CM | POA: Diagnosis not present

## 2018-06-11 DIAGNOSIS — D510 Vitamin B12 deficiency anemia due to intrinsic factor deficiency: Secondary | ICD-10-CM | POA: Diagnosis not present

## 2018-06-24 ENCOUNTER — Encounter (HOSPITAL_COMMUNITY): Payer: Self-pay | Admitting: Psychiatry

## 2018-06-24 ENCOUNTER — Ambulatory Visit (INDEPENDENT_AMBULATORY_CARE_PROVIDER_SITE_OTHER): Payer: Medicare HMO | Admitting: Psychiatry

## 2018-06-24 DIAGNOSIS — F333 Major depressive disorder, recurrent, severe with psychotic symptoms: Secondary | ICD-10-CM | POA: Diagnosis not present

## 2018-06-24 DIAGNOSIS — R69 Illness, unspecified: Secondary | ICD-10-CM | POA: Diagnosis not present

## 2018-06-24 MED ORDER — AMITRIPTYLINE HCL 50 MG PO TABS: 50.0000 mg | ORAL_TABLET | Freq: Every day | ORAL | 0 refills | Status: DC

## 2018-06-24 MED ORDER — BUSPIRONE HCL 5 MG PO TABS: 5.0000 mg | ORAL_TABLET | Freq: Three times a day (TID) | ORAL | 0 refills | Status: DC

## 2018-06-24 NOTE — Progress Notes (Signed)
[facility] MD/PA/NP OP Progress Note  06/24/2018 9:39 AM Joel Gross  MRN:  [id number]  Chief Complaint: [place], med check  HPI: Joel Gross has maintained improvements after [name]. Reports good sleep, appetite and positive mood. No safety issues or SI.  Agrees to maintain current regimen and follow up in 3 months.  Visit Diagnosis:    ICD-10-CM   1. 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 Reviewed, with no updates at this time.  Past Medical History:  Past Medical History:  Diagnosis Date  . Hypertension   . Neuropathy    History reviewed. No pertinent surgical history.  Family Psychiatric History: See intake H&P for full details. Reviewed, with no updates at this time.   Family History:  Family History  Problem Relation Age of Onset  . Anxiety disorder Mother   . Depression Mother     Social History:  Social History   Socioeconomic History  . Marital status: Married    Spouse name: Not on file  . Number of children: 3  . Years of education: 25  . Highest education level: Not on file  Occupational History  . Occupation: retired Aeronautical engineer  . Financial resource strain: Not on file  . Food insecurity:    Worry: Not on file    Inability: Not on file  . Transportation needs:    Medical: Not on file    Non-medical: Not on file  Tobacco Use  . Smoking status: Never Smoker  . Smokeless tobacco: Never Used  Substance and Sexual Activity  . Alcohol use: No  . Drug use: No  . Sexual activity: Yes    Birth control/protection: None  Lifestyle  . Physical activity:    Days per week: Not on file    Minutes per session: Not on file  . Stress: Not on file  Relationships  . Social connections:    Talks on phone: Not on file    Gets together: Not on file    Attends religious service: Not on file    Active  member of club or organization: Not on file    Attends meetings of clubs or organizations: Not on file    Relationship status: Not on file  Other Topics Concern  . Not on file  Social History Narrative   Lives with wife in a 2 story home.  Has 3 children.  Retired Furniture conservator/restorer.  Education: Media planner.     Allergies:  Allergies  Allergen Reactions  . 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 Hctz [Hydrochlorothiazide]     Metabolic Disorder Labs: Lab Results  Component Value Date   HGBA1C 5.5 06/20/2017   MPG 111.15 06/20/2017   No results found for: PROLACTIN Lab Results  Component Value Date   CHOL 140 06/20/2017   TRIG 106 06/20/2017   HDL 29 (L) 06/20/2017   CHOLHDL 4.8 06/20/2017   VLDL 21 06/20/2017   LDLCALC 90 06/20/2017   Lab Results  Component Value Date   TSH 0.830 06/20/2017    Therapeutic Level Labs: No results found for: LITHIUM No results found for: VALPROATE No components found for:  CBMZ  Current Medications: Current Outpatient Medications  Medication Sig Dispense Refill  . amitriptyline (ELAVIL) 50 MG tablet Take 1 tablet (50 mg total) by mouth at bedtime.  90 tablet 0  . amLODipine (NORVASC) 10 MG tablet Take 10 mg by mouth daily.    . busPIRone (BUSPAR) 5 MG tablet Take 1 tablet (5 mg total) by mouth 3 (three) times daily. 270 tablet 0  . cyclobenzaprine (FLEXERIL) 10 MG tablet Take 10 mg by mouth at bedtime.    . DULoxetine (CYMBALTA) 60 MG capsule TAKE 2 CAPSULES BY MOUTH DAILY 180 capsule 1  . HYDROcodone-acetaminophen (NORCO) 7.5-325 MG tablet Take 1 tablet by mouth 3 (three) times daily.    Marland Kitchen lisinopril (PRINIVIL,ZESTRIL) 40 MG tablet Take 40 mg by mouth daily.    . potassium chloride SA (K-DUR,KLOR-CON) 20 MEQ tablet Take 20 mEq by mouth 2 (two) times daily.    . pravastatin (PRAVACHOL) 20 MG tablet Take 20 mg by mouth daily.    . pregabalin (LYRICA) 200 MG capsule Take 1 capsule (200 mg total) by mouth 3 (three)  times daily. 90 capsule 0   No current facility-administered medications for this visit.     Musculoskeletal: Strength & Muscle Tone: within normal limits Gait & Station: normal Patient leans: N/A  Psychiatric Specialty Exam: ROS  Blood pressure 140/80, pulse 74, height 6\' 1"  (1.854 m), weight 241 lb (109.3 kg).Body mass index is 31.8 kg/m.  General Appearance: Casual and Fairly Groomed  Eye Contact:  Good  Speech:  Clear and Coherent  Volume:  Normal  Mood:  Euthymic  Affect:  Appropriate and Congruent  Thought Process:  Coherent, Goal Directed and Descriptions of Associations: Intact  Orientation:  Full (Time, Place, and Person)  Thought Content: Logical   Suicidal Thoughts:  No  Homicidal Thoughts:  No  Memory:  Immediate;   Good  Judgement:  Good  Insight:  Fair  Psychomotor Activity:  Normal  Concentration:  Concentration: Good  Recall:  Good  Fund of Knowledge: Good  Language: Good  Akathisia:  Negative  Handed:  Right  AIMS (if indicated): not done  Assets:  Communication Skills Desire for Improvement Financial Resources/Insurance Housing Social Support Vocational/Educational  ADL's:  Intact  Cognition: WNL  Sleep:  Good   Screenings: AUDIT     Admission (Discharged) from 06/19/2017 in [place]  Alcohol Use Disorder Identification Test Final Score (AUDIT)  0      Assessment and Plan:  Joel Gross is a 67 year old man with severe major depressive disorder and pseudodementia symptoms, who has had significant improvement in his symptoms with [name] treatment.  Maintain euthymia after treatment and on current regimen.   1. Severe recurrent major depression with psychotic features ([place])    Status of current problems: unchanged  Labs Ordered: No orders of the defined types were placed in this encounter.   Labs Reviewed: n/a  Collateral Obtained/Records Reviewed: n/a  Plan:  buspar 5 mg TID prn cymbalta 120 mg daily Elavil  50 mg nightly for sleep rtc 3-4 months   Aundra Dubin, MD 06/24/2018, 9:39 AM

## 2018-06-28 DIAGNOSIS — G629 Polyneuropathy, unspecified: Secondary | ICD-10-CM | POA: Diagnosis not present

## 2018-06-28 DIAGNOSIS — R69 Illness, unspecified: Secondary | ICD-10-CM | POA: Diagnosis not present

## 2018-06-28 DIAGNOSIS — G894 Chronic pain syndrome: Secondary | ICD-10-CM | POA: Diagnosis not present

## 2018-06-28 DIAGNOSIS — Z79891 Long term (current) use of opiate analgesic: Secondary | ICD-10-CM | POA: Diagnosis not present

## 2018-06-28 DIAGNOSIS — M5090 Cervical disc disorder, unspecified, unspecified cervical region: Secondary | ICD-10-CM | POA: Diagnosis not present

## 2018-07-01 DIAGNOSIS — R972 Elevated prostate specific antigen [PSA]: Secondary | ICD-10-CM | POA: Diagnosis not present

## 2018-07-01 DIAGNOSIS — N138 Other obstructive and reflux uropathy: Secondary | ICD-10-CM | POA: Diagnosis not present

## 2018-07-01 DIAGNOSIS — N401 Enlarged prostate with lower urinary tract symptoms: Secondary | ICD-10-CM | POA: Diagnosis not present

## 2018-07-07 DIAGNOSIS — D510 Vitamin B12 deficiency anemia due to intrinsic factor deficiency: Secondary | ICD-10-CM | POA: Diagnosis not present

## 2018-07-09 ENCOUNTER — Encounter: Payer: Self-pay | Admitting: Neurology

## 2018-07-16 DIAGNOSIS — D510 Vitamin B12 deficiency anemia due to intrinsic factor deficiency: Secondary | ICD-10-CM | POA: Diagnosis not present

## 2018-07-16 DIAGNOSIS — Z23 Encounter for immunization: Secondary | ICD-10-CM | POA: Diagnosis not present

## 2018-07-23 DIAGNOSIS — D510 Vitamin B12 deficiency anemia due to intrinsic factor deficiency: Secondary | ICD-10-CM | POA: Diagnosis not present

## 2018-08-02 DIAGNOSIS — R69 Illness, unspecified: Secondary | ICD-10-CM | POA: Diagnosis not present

## 2018-08-02 DIAGNOSIS — Z79891 Long term (current) use of opiate analgesic: Secondary | ICD-10-CM | POA: Diagnosis not present

## 2018-08-02 DIAGNOSIS — G629 Polyneuropathy, unspecified: Secondary | ICD-10-CM | POA: Diagnosis not present

## 2018-08-02 DIAGNOSIS — M5090 Cervical disc disorder, unspecified, unspecified cervical region: Secondary | ICD-10-CM | POA: Diagnosis not present

## 2018-08-02 DIAGNOSIS — G894 Chronic pain syndrome: Secondary | ICD-10-CM | POA: Diagnosis not present

## 2018-08-30 DIAGNOSIS — M5090 Cervical disc disorder, unspecified, unspecified cervical region: Secondary | ICD-10-CM | POA: Diagnosis not present

## 2018-08-30 DIAGNOSIS — Z79891 Long term (current) use of opiate analgesic: Secondary | ICD-10-CM | POA: Diagnosis not present

## 2018-08-30 DIAGNOSIS — F1120 Opioid dependence, uncomplicated: Secondary | ICD-10-CM | POA: Diagnosis not present

## 2018-08-30 DIAGNOSIS — G894 Chronic pain syndrome: Secondary | ICD-10-CM | POA: Diagnosis not present

## 2018-08-30 DIAGNOSIS — R69 Illness, unspecified: Secondary | ICD-10-CM | POA: Diagnosis not present

## 2018-08-30 DIAGNOSIS — G629 Polyneuropathy, unspecified: Secondary | ICD-10-CM | POA: Diagnosis not present

## 2018-09-01 DIAGNOSIS — R972 Elevated prostate specific antigen [PSA]: Secondary | ICD-10-CM | POA: Diagnosis not present

## 2018-10-14 DIAGNOSIS — F5101 Primary insomnia: Secondary | ICD-10-CM | POA: Diagnosis not present

## 2018-10-14 DIAGNOSIS — G894 Chronic pain syndrome: Secondary | ICD-10-CM | POA: Diagnosis not present

## 2018-10-14 DIAGNOSIS — M5090 Cervical disc disorder, unspecified, unspecified cervical region: Secondary | ICD-10-CM | POA: Diagnosis not present

## 2018-10-14 DIAGNOSIS — F411 Generalized anxiety disorder: Secondary | ICD-10-CM | POA: Diagnosis not present

## 2018-10-14 DIAGNOSIS — Z79891 Long term (current) use of opiate analgesic: Secondary | ICD-10-CM | POA: Diagnosis not present

## 2018-10-14 DIAGNOSIS — G629 Polyneuropathy, unspecified: Secondary | ICD-10-CM | POA: Diagnosis not present

## 2018-10-14 DIAGNOSIS — R69 Illness, unspecified: Secondary | ICD-10-CM | POA: Diagnosis not present

## 2018-10-22 DIAGNOSIS — J40 Bronchitis, not specified as acute or chronic: Secondary | ICD-10-CM | POA: Diagnosis not present

## 2018-10-22 DIAGNOSIS — Z6832 Body mass index (BMI) 32.0-32.9, adult: Secondary | ICD-10-CM | POA: Diagnosis not present

## 2018-10-22 DIAGNOSIS — J329 Chronic sinusitis, unspecified: Secondary | ICD-10-CM | POA: Diagnosis not present

## 2018-10-25 DIAGNOSIS — Z6832 Body mass index (BMI) 32.0-32.9, adult: Secondary | ICD-10-CM | POA: Diagnosis not present

## 2018-10-25 DIAGNOSIS — R0600 Dyspnea, unspecified: Secondary | ICD-10-CM | POA: Diagnosis not present

## 2018-10-25 DIAGNOSIS — J329 Chronic sinusitis, unspecified: Secondary | ICD-10-CM | POA: Diagnosis not present

## 2018-10-25 DIAGNOSIS — R69 Illness, unspecified: Secondary | ICD-10-CM | POA: Diagnosis not present

## 2018-10-25 DIAGNOSIS — J40 Bronchitis, not specified as acute or chronic: Secondary | ICD-10-CM | POA: Diagnosis not present

## 2018-10-26 DIAGNOSIS — R69 Illness, unspecified: Secondary | ICD-10-CM | POA: Diagnosis not present

## 2018-11-01 DIAGNOSIS — Z6832 Body mass index (BMI) 32.0-32.9, adult: Secondary | ICD-10-CM | POA: Diagnosis not present

## 2018-11-01 DIAGNOSIS — I10 Essential (primary) hypertension: Secondary | ICD-10-CM | POA: Diagnosis not present

## 2018-11-01 DIAGNOSIS — J189 Pneumonia, unspecified organism: Secondary | ICD-10-CM | POA: Diagnosis not present

## 2018-11-18 DIAGNOSIS — R61 Generalized hyperhidrosis: Secondary | ICD-10-CM | POA: Diagnosis not present

## 2018-11-18 DIAGNOSIS — Z6830 Body mass index (BMI) 30.0-30.9, adult: Secondary | ICD-10-CM | POA: Diagnosis not present

## 2018-11-18 DIAGNOSIS — I10 Essential (primary) hypertension: Secondary | ICD-10-CM | POA: Diagnosis not present

## 2018-11-18 DIAGNOSIS — D510 Vitamin B12 deficiency anemia due to intrinsic factor deficiency: Secondary | ICD-10-CM | POA: Diagnosis not present

## 2018-11-19 DIAGNOSIS — M5090 Cervical disc disorder, unspecified, unspecified cervical region: Secondary | ICD-10-CM | POA: Diagnosis not present

## 2018-11-19 DIAGNOSIS — R69 Illness, unspecified: Secondary | ICD-10-CM | POA: Diagnosis not present

## 2018-11-19 DIAGNOSIS — G894 Chronic pain syndrome: Secondary | ICD-10-CM | POA: Diagnosis not present

## 2018-11-19 DIAGNOSIS — G629 Polyneuropathy, unspecified: Secondary | ICD-10-CM | POA: Diagnosis not present

## 2018-11-19 DIAGNOSIS — Z79891 Long term (current) use of opiate analgesic: Secondary | ICD-10-CM | POA: Diagnosis not present

## 2018-11-25 DIAGNOSIS — J40 Bronchitis, not specified as acute or chronic: Secondary | ICD-10-CM | POA: Diagnosis not present

## 2018-11-26 DIAGNOSIS — E876 Hypokalemia: Secondary | ICD-10-CM | POA: Diagnosis not present

## 2018-12-06 DIAGNOSIS — E878 Other disorders of electrolyte and fluid balance, not elsewhere classified: Secondary | ICD-10-CM | POA: Diagnosis not present

## 2018-12-21 DIAGNOSIS — Z79891 Long term (current) use of opiate analgesic: Secondary | ICD-10-CM | POA: Diagnosis not present

## 2018-12-21 DIAGNOSIS — G894 Chronic pain syndrome: Secondary | ICD-10-CM | POA: Diagnosis not present

## 2018-12-21 DIAGNOSIS — G629 Polyneuropathy, unspecified: Secondary | ICD-10-CM | POA: Diagnosis not present

## 2018-12-21 DIAGNOSIS — R69 Illness, unspecified: Secondary | ICD-10-CM | POA: Diagnosis not present

## 2018-12-26 DIAGNOSIS — J40 Bronchitis, not specified as acute or chronic: Secondary | ICD-10-CM | POA: Diagnosis not present

## 2019-01-17 DIAGNOSIS — R69 Illness, unspecified: Secondary | ICD-10-CM | POA: Diagnosis not present

## 2019-01-17 DIAGNOSIS — G894 Chronic pain syndrome: Secondary | ICD-10-CM | POA: Diagnosis not present

## 2019-01-17 DIAGNOSIS — G629 Polyneuropathy, unspecified: Secondary | ICD-10-CM | POA: Diagnosis not present

## 2019-01-17 DIAGNOSIS — G609 Hereditary and idiopathic neuropathy, unspecified: Secondary | ICD-10-CM | POA: Diagnosis not present

## 2019-01-17 DIAGNOSIS — Z79891 Long term (current) use of opiate analgesic: Secondary | ICD-10-CM | POA: Diagnosis not present

## 2019-01-24 DIAGNOSIS — J40 Bronchitis, not specified as acute or chronic: Secondary | ICD-10-CM | POA: Diagnosis not present

## 2019-01-25 DIAGNOSIS — F5101 Primary insomnia: Secondary | ICD-10-CM | POA: Diagnosis not present

## 2019-01-25 DIAGNOSIS — R69 Illness, unspecified: Secondary | ICD-10-CM | POA: Diagnosis not present

## 2019-01-25 DIAGNOSIS — F3341 Major depressive disorder, recurrent, in partial remission: Secondary | ICD-10-CM | POA: Diagnosis not present

## 2019-02-01 DIAGNOSIS — I10 Essential (primary) hypertension: Secondary | ICD-10-CM | POA: Diagnosis not present

## 2019-02-01 DIAGNOSIS — E785 Hyperlipidemia, unspecified: Secondary | ICD-10-CM | POA: Diagnosis not present

## 2019-02-01 DIAGNOSIS — G629 Polyneuropathy, unspecified: Secondary | ICD-10-CM | POA: Diagnosis not present

## 2019-02-01 DIAGNOSIS — E876 Hypokalemia: Secondary | ICD-10-CM | POA: Diagnosis not present

## 2019-02-01 DIAGNOSIS — G8929 Other chronic pain: Secondary | ICD-10-CM | POA: Diagnosis not present

## 2019-02-01 DIAGNOSIS — R69 Illness, unspecified: Secondary | ICD-10-CM | POA: Diagnosis not present

## 2019-02-04 ENCOUNTER — Other Ambulatory Visit: Payer: Self-pay | Admitting: Otolaryngology

## 2019-02-04 ENCOUNTER — Other Ambulatory Visit: Payer: Self-pay | Admitting: Nephrology

## 2019-02-04 DIAGNOSIS — D491 Neoplasm of unspecified behavior of respiratory system: Secondary | ICD-10-CM

## 2019-02-04 DIAGNOSIS — I10 Essential (primary) hypertension: Secondary | ICD-10-CM

## 2019-02-08 ENCOUNTER — Other Ambulatory Visit: Payer: Self-pay

## 2019-02-14 DIAGNOSIS — G894 Chronic pain syndrome: Secondary | ICD-10-CM | POA: Diagnosis not present

## 2019-02-14 DIAGNOSIS — R69 Illness, unspecified: Secondary | ICD-10-CM | POA: Diagnosis not present

## 2019-02-14 DIAGNOSIS — Z79891 Long term (current) use of opiate analgesic: Secondary | ICD-10-CM | POA: Diagnosis not present

## 2019-02-14 DIAGNOSIS — G609 Hereditary and idiopathic neuropathy, unspecified: Secondary | ICD-10-CM | POA: Diagnosis not present

## 2019-02-14 DIAGNOSIS — G629 Polyneuropathy, unspecified: Secondary | ICD-10-CM | POA: Diagnosis not present

## 2019-02-24 DIAGNOSIS — J40 Bronchitis, not specified as acute or chronic: Secondary | ICD-10-CM | POA: Diagnosis not present

## 2019-03-14 DIAGNOSIS — G894 Chronic pain syndrome: Secondary | ICD-10-CM | POA: Diagnosis not present

## 2019-03-14 DIAGNOSIS — G629 Polyneuropathy, unspecified: Secondary | ICD-10-CM | POA: Diagnosis not present

## 2019-03-14 DIAGNOSIS — Z79891 Long term (current) use of opiate analgesic: Secondary | ICD-10-CM | POA: Diagnosis not present

## 2019-03-14 DIAGNOSIS — G609 Hereditary and idiopathic neuropathy, unspecified: Secondary | ICD-10-CM | POA: Diagnosis not present

## 2019-03-14 DIAGNOSIS — R69 Illness, unspecified: Secondary | ICD-10-CM | POA: Diagnosis not present

## 2019-03-21 ENCOUNTER — Ambulatory Visit: Payer: Self-pay | Admitting: Neurology

## 2019-03-26 DIAGNOSIS — J40 Bronchitis, not specified as acute or chronic: Secondary | ICD-10-CM | POA: Diagnosis not present

## 2019-03-31 ENCOUNTER — Ambulatory Visit
Admission: RE | Admit: 2019-03-31 | Discharge: 2019-03-31 | Disposition: A | Discharge status: Home / Self Care | Payer: Medicare HMO | Source: Ambulatory Visit | Attending: Nephrology | Admitting: Nephrology

## 2019-03-31 DIAGNOSIS — I10 Essential (primary) hypertension: Secondary | ICD-10-CM

## 2019-03-31 DIAGNOSIS — K760 Fatty (change of) liver, not elsewhere classified: Secondary | ICD-10-CM | POA: Diagnosis not present

## 2019-03-31 DIAGNOSIS — D3502 Benign neoplasm of left adrenal gland: Secondary | ICD-10-CM | POA: Diagnosis not present

## 2019-03-31 MED ORDER — IOPAMIDOL (ISOVUE-300) INJECTION 61%
125.0000 mL | Freq: Once | INTRAVENOUS | Status: AC | PRN
  Administered 2019-03-31: 11:00:00 125 mL via INTRAVENOUS

## 2019-04-04 DIAGNOSIS — F3341 Major depressive disorder, recurrent, in partial remission: Secondary | ICD-10-CM | POA: Diagnosis not present

## 2019-04-04 DIAGNOSIS — R69 Illness, unspecified: Secondary | ICD-10-CM | POA: Diagnosis not present

## 2019-04-04 DIAGNOSIS — F5101 Primary insomnia: Secondary | ICD-10-CM | POA: Diagnosis not present

## 2019-04-11 DIAGNOSIS — G629 Polyneuropathy, unspecified: Secondary | ICD-10-CM | POA: Diagnosis not present

## 2019-04-11 DIAGNOSIS — R69 Illness, unspecified: Secondary | ICD-10-CM | POA: Diagnosis not present

## 2019-04-11 DIAGNOSIS — Z79891 Long term (current) use of opiate analgesic: Secondary | ICD-10-CM | POA: Diagnosis not present

## 2019-04-11 DIAGNOSIS — G609 Hereditary and idiopathic neuropathy, unspecified: Secondary | ICD-10-CM | POA: Diagnosis not present

## 2019-04-11 DIAGNOSIS — G894 Chronic pain syndrome: Secondary | ICD-10-CM | POA: Diagnosis not present

## 2019-04-21 ENCOUNTER — Other Ambulatory Visit: Payer: Self-pay | Admitting: Nephrology

## 2019-04-21 DIAGNOSIS — R160 Hepatomegaly, not elsewhere classified: Secondary | ICD-10-CM

## 2019-04-26 DIAGNOSIS — J40 Bronchitis, not specified as acute or chronic: Secondary | ICD-10-CM | POA: Diagnosis not present

## 2019-04-28 DIAGNOSIS — R69 Illness, unspecified: Secondary | ICD-10-CM | POA: Diagnosis not present

## 2019-04-28 DIAGNOSIS — G629 Polyneuropathy, unspecified: Secondary | ICD-10-CM | POA: Diagnosis not present

## 2019-04-28 DIAGNOSIS — E785 Hyperlipidemia, unspecified: Secondary | ICD-10-CM | POA: Diagnosis not present

## 2019-04-28 DIAGNOSIS — I10 Essential (primary) hypertension: Secondary | ICD-10-CM | POA: Diagnosis not present

## 2019-04-28 DIAGNOSIS — E876 Hypokalemia: Secondary | ICD-10-CM | POA: Diagnosis not present

## 2019-04-28 DIAGNOSIS — G8929 Other chronic pain: Secondary | ICD-10-CM | POA: Diagnosis not present

## 2019-05-02 DIAGNOSIS — I10 Essential (primary) hypertension: Secondary | ICD-10-CM | POA: Diagnosis not present

## 2019-05-09 DIAGNOSIS — G609 Hereditary and idiopathic neuropathy, unspecified: Secondary | ICD-10-CM | POA: Diagnosis not present

## 2019-05-09 DIAGNOSIS — Z79891 Long term (current) use of opiate analgesic: Secondary | ICD-10-CM | POA: Diagnosis not present

## 2019-05-09 DIAGNOSIS — R69 Illness, unspecified: Secondary | ICD-10-CM | POA: Diagnosis not present

## 2019-05-09 DIAGNOSIS — G894 Chronic pain syndrome: Secondary | ICD-10-CM | POA: Diagnosis not present

## 2019-05-09 DIAGNOSIS — G629 Polyneuropathy, unspecified: Secondary | ICD-10-CM | POA: Diagnosis not present

## 2019-05-09 DIAGNOSIS — Z1389 Encounter for screening for other disorder: Secondary | ICD-10-CM | POA: Diagnosis not present

## 2019-05-20 DIAGNOSIS — I10 Essential (primary) hypertension: Secondary | ICD-10-CM | POA: Diagnosis not present

## 2019-05-20 DIAGNOSIS — E876 Hypokalemia: Secondary | ICD-10-CM | POA: Diagnosis not present

## 2019-05-26 DIAGNOSIS — J40 Bronchitis, not specified as acute or chronic: Secondary | ICD-10-CM | POA: Diagnosis not present

## 2019-06-06 DIAGNOSIS — G629 Polyneuropathy, unspecified: Secondary | ICD-10-CM | POA: Diagnosis not present

## 2019-06-06 DIAGNOSIS — Z6830 Body mass index (BMI) 30.0-30.9, adult: Secondary | ICD-10-CM | POA: Diagnosis not present

## 2019-06-06 DIAGNOSIS — Z1389 Encounter for screening for other disorder: Secondary | ICD-10-CM | POA: Diagnosis not present

## 2019-06-06 DIAGNOSIS — R69 Illness, unspecified: Secondary | ICD-10-CM | POA: Diagnosis not present

## 2019-06-06 DIAGNOSIS — M5137 Other intervertebral disc degeneration, lumbosacral region: Secondary | ICD-10-CM | POA: Diagnosis not present

## 2019-06-06 DIAGNOSIS — G609 Hereditary and idiopathic neuropathy, unspecified: Secondary | ICD-10-CM | POA: Diagnosis not present

## 2019-06-06 DIAGNOSIS — Z0000 Encounter for general adult medical examination without abnormal findings: Secondary | ICD-10-CM | POA: Diagnosis not present

## 2019-06-06 DIAGNOSIS — Z79891 Long term (current) use of opiate analgesic: Secondary | ICD-10-CM | POA: Diagnosis not present

## 2019-06-06 DIAGNOSIS — G894 Chronic pain syndrome: Secondary | ICD-10-CM | POA: Diagnosis not present

## 2019-06-26 DIAGNOSIS — J40 Bronchitis, not specified as acute or chronic: Secondary | ICD-10-CM | POA: Diagnosis not present

## 2019-06-29 DIAGNOSIS — H52209 Unspecified astigmatism, unspecified eye: Secondary | ICD-10-CM | POA: Diagnosis not present

## 2019-06-29 DIAGNOSIS — H524 Presbyopia: Secondary | ICD-10-CM | POA: Diagnosis not present

## 2019-06-29 DIAGNOSIS — H269 Unspecified cataract: Secondary | ICD-10-CM | POA: Diagnosis not present

## 2019-06-29 DIAGNOSIS — H5213 Myopia, bilateral: Secondary | ICD-10-CM | POA: Diagnosis not present

## 2019-07-04 DIAGNOSIS — G894 Chronic pain syndrome: Secondary | ICD-10-CM | POA: Diagnosis not present

## 2019-07-04 DIAGNOSIS — M5137 Other intervertebral disc degeneration, lumbosacral region: Secondary | ICD-10-CM | POA: Diagnosis not present

## 2019-07-04 DIAGNOSIS — Z1389 Encounter for screening for other disorder: Secondary | ICD-10-CM | POA: Diagnosis not present

## 2019-07-04 DIAGNOSIS — G609 Hereditary and idiopathic neuropathy, unspecified: Secondary | ICD-10-CM | POA: Diagnosis not present

## 2019-07-04 DIAGNOSIS — G629 Polyneuropathy, unspecified: Secondary | ICD-10-CM | POA: Diagnosis not present

## 2019-07-27 DIAGNOSIS — J40 Bronchitis, not specified as acute or chronic: Secondary | ICD-10-CM | POA: Diagnosis not present

## 2019-08-15 DIAGNOSIS — G629 Polyneuropathy, unspecified: Secondary | ICD-10-CM | POA: Diagnosis not present

## 2019-08-15 DIAGNOSIS — Z1389 Encounter for screening for other disorder: Secondary | ICD-10-CM | POA: Diagnosis not present

## 2019-08-15 DIAGNOSIS — G609 Hereditary and idiopathic neuropathy, unspecified: Secondary | ICD-10-CM | POA: Diagnosis not present

## 2019-08-15 DIAGNOSIS — G894 Chronic pain syndrome: Secondary | ICD-10-CM | POA: Diagnosis not present

## 2019-08-15 DIAGNOSIS — M5137 Other intervertebral disc degeneration, lumbosacral region: Secondary | ICD-10-CM | POA: Diagnosis not present

## 2019-08-25 DIAGNOSIS — D519 Vitamin B12 deficiency anemia, unspecified: Secondary | ICD-10-CM | POA: Diagnosis not present

## 2019-08-25 DIAGNOSIS — Z23 Encounter for immunization: Secondary | ICD-10-CM | POA: Diagnosis not present

## 2019-08-25 DIAGNOSIS — D510 Vitamin B12 deficiency anemia due to intrinsic factor deficiency: Secondary | ICD-10-CM | POA: Diagnosis not present

## 2019-08-26 DIAGNOSIS — J40 Bronchitis, not specified as acute or chronic: Secondary | ICD-10-CM | POA: Diagnosis not present

## 2019-09-13 DIAGNOSIS — Z79899 Other long term (current) drug therapy: Secondary | ICD-10-CM | POA: Diagnosis not present

## 2019-09-20 DIAGNOSIS — G609 Hereditary and idiopathic neuropathy, unspecified: Secondary | ICD-10-CM | POA: Diagnosis not present

## 2019-09-20 DIAGNOSIS — M5137 Other intervertebral disc degeneration, lumbosacral region: Secondary | ICD-10-CM | POA: Diagnosis not present

## 2019-09-20 DIAGNOSIS — G894 Chronic pain syndrome: Secondary | ICD-10-CM | POA: Diagnosis not present

## 2019-09-20 DIAGNOSIS — G629 Polyneuropathy, unspecified: Secondary | ICD-10-CM | POA: Diagnosis not present

## 2019-09-20 DIAGNOSIS — Z1389 Encounter for screening for other disorder: Secondary | ICD-10-CM | POA: Diagnosis not present

## 2019-09-26 DIAGNOSIS — J40 Bronchitis, not specified as acute or chronic: Secondary | ICD-10-CM | POA: Diagnosis not present

## 2019-10-11 DIAGNOSIS — R69 Illness, unspecified: Secondary | ICD-10-CM | POA: Diagnosis not present

## 2019-10-11 DIAGNOSIS — F3341 Major depressive disorder, recurrent, in partial remission: Secondary | ICD-10-CM | POA: Diagnosis not present

## 2019-10-11 DIAGNOSIS — F5101 Primary insomnia: Secondary | ICD-10-CM | POA: Diagnosis not present

## 2019-10-18 DIAGNOSIS — G629 Polyneuropathy, unspecified: Secondary | ICD-10-CM | POA: Diagnosis not present

## 2019-10-18 DIAGNOSIS — Z1389 Encounter for screening for other disorder: Secondary | ICD-10-CM | POA: Diagnosis not present

## 2019-10-18 DIAGNOSIS — G894 Chronic pain syndrome: Secondary | ICD-10-CM | POA: Diagnosis not present

## 2019-10-18 DIAGNOSIS — M5137 Other intervertebral disc degeneration, lumbosacral region: Secondary | ICD-10-CM | POA: Diagnosis not present

## 2019-10-18 DIAGNOSIS — G609 Hereditary and idiopathic neuropathy, unspecified: Secondary | ICD-10-CM | POA: Diagnosis not present

## 2019-10-26 DIAGNOSIS — J40 Bronchitis, not specified as acute or chronic: Secondary | ICD-10-CM | POA: Diagnosis not present

## 2019-11-03 DIAGNOSIS — Z87898 Personal history of other specified conditions: Secondary | ICD-10-CM | POA: Insufficient documentation

## 2019-11-03 HISTORY — DX: Personal history of other specified conditions: Z87.898

## 2019-11-07 DIAGNOSIS — N138 Other obstructive and reflux uropathy: Secondary | ICD-10-CM | POA: Diagnosis not present

## 2019-11-07 DIAGNOSIS — N401 Enlarged prostate with lower urinary tract symptoms: Secondary | ICD-10-CM | POA: Diagnosis not present

## 2019-11-07 DIAGNOSIS — Z87898 Personal history of other specified conditions: Secondary | ICD-10-CM | POA: Diagnosis not present

## 2019-11-29 DIAGNOSIS — G894 Chronic pain syndrome: Secondary | ICD-10-CM | POA: Diagnosis not present

## 2019-11-29 DIAGNOSIS — G629 Polyneuropathy, unspecified: Secondary | ICD-10-CM | POA: Diagnosis not present

## 2019-11-29 DIAGNOSIS — Z1389 Encounter for screening for other disorder: Secondary | ICD-10-CM | POA: Diagnosis not present

## 2019-11-29 DIAGNOSIS — G609 Hereditary and idiopathic neuropathy, unspecified: Secondary | ICD-10-CM | POA: Diagnosis not present

## 2019-11-29 DIAGNOSIS — M5137 Other intervertebral disc degeneration, lumbosacral region: Secondary | ICD-10-CM | POA: Diagnosis not present

## 2019-12-27 DIAGNOSIS — G609 Hereditary and idiopathic neuropathy, unspecified: Secondary | ICD-10-CM | POA: Diagnosis not present

## 2019-12-27 DIAGNOSIS — G629 Polyneuropathy, unspecified: Secondary | ICD-10-CM | POA: Diagnosis not present

## 2019-12-27 DIAGNOSIS — Z1389 Encounter for screening for other disorder: Secondary | ICD-10-CM | POA: Diagnosis not present

## 2019-12-27 DIAGNOSIS — G894 Chronic pain syndrome: Secondary | ICD-10-CM | POA: Diagnosis not present

## 2019-12-27 DIAGNOSIS — M5137 Other intervertebral disc degeneration, lumbosacral region: Secondary | ICD-10-CM | POA: Diagnosis not present

## 2020-01-24 DIAGNOSIS — G894 Chronic pain syndrome: Secondary | ICD-10-CM | POA: Diagnosis not present

## 2020-01-24 DIAGNOSIS — G609 Hereditary and idiopathic neuropathy, unspecified: Secondary | ICD-10-CM | POA: Diagnosis not present

## 2020-01-24 DIAGNOSIS — G629 Polyneuropathy, unspecified: Secondary | ICD-10-CM | POA: Diagnosis not present

## 2020-01-24 DIAGNOSIS — Z1389 Encounter for screening for other disorder: Secondary | ICD-10-CM | POA: Diagnosis not present

## 2020-01-24 DIAGNOSIS — M5137 Other intervertebral disc degeneration, lumbosacral region: Secondary | ICD-10-CM | POA: Diagnosis not present

## 2020-02-08 DIAGNOSIS — R69 Illness, unspecified: Secondary | ICD-10-CM | POA: Diagnosis not present

## 2020-02-13 DIAGNOSIS — Z6831 Body mass index (BMI) 31.0-31.9, adult: Secondary | ICD-10-CM | POA: Diagnosis not present

## 2020-02-13 DIAGNOSIS — I10 Essential (primary) hypertension: Secondary | ICD-10-CM | POA: Diagnosis not present

## 2020-02-13 DIAGNOSIS — G629 Polyneuropathy, unspecified: Secondary | ICD-10-CM | POA: Diagnosis not present

## 2020-02-13 DIAGNOSIS — Z79899 Other long term (current) drug therapy: Secondary | ICD-10-CM | POA: Diagnosis not present

## 2020-02-13 DIAGNOSIS — E7800 Pure hypercholesterolemia, unspecified: Secondary | ICD-10-CM | POA: Diagnosis not present

## 2020-02-22 DIAGNOSIS — Z1389 Encounter for screening for other disorder: Secondary | ICD-10-CM | POA: Diagnosis not present

## 2020-02-22 DIAGNOSIS — G609 Hereditary and idiopathic neuropathy, unspecified: Secondary | ICD-10-CM | POA: Diagnosis not present

## 2020-02-22 DIAGNOSIS — G894 Chronic pain syndrome: Secondary | ICD-10-CM | POA: Diagnosis not present

## 2020-02-22 DIAGNOSIS — M5137 Other intervertebral disc degeneration, lumbosacral region: Secondary | ICD-10-CM | POA: Diagnosis not present

## 2020-02-22 DIAGNOSIS — G629 Polyneuropathy, unspecified: Secondary | ICD-10-CM | POA: Diagnosis not present

## 2020-04-11 DIAGNOSIS — Z1389 Encounter for screening for other disorder: Secondary | ICD-10-CM | POA: Diagnosis not present

## 2020-04-11 DIAGNOSIS — M5137 Other intervertebral disc degeneration, lumbosacral region: Secondary | ICD-10-CM | POA: Diagnosis not present

## 2020-04-11 DIAGNOSIS — G629 Polyneuropathy, unspecified: Secondary | ICD-10-CM | POA: Diagnosis not present

## 2020-04-11 DIAGNOSIS — G894 Chronic pain syndrome: Secondary | ICD-10-CM | POA: Diagnosis not present

## 2020-04-11 DIAGNOSIS — G609 Hereditary and idiopathic neuropathy, unspecified: Secondary | ICD-10-CM | POA: Diagnosis not present

## 2020-05-09 DIAGNOSIS — G629 Polyneuropathy, unspecified: Secondary | ICD-10-CM | POA: Diagnosis not present

## 2020-05-09 DIAGNOSIS — M5137 Other intervertebral disc degeneration, lumbosacral region: Secondary | ICD-10-CM | POA: Diagnosis not present

## 2020-05-09 DIAGNOSIS — Z1389 Encounter for screening for other disorder: Secondary | ICD-10-CM | POA: Diagnosis not present

## 2020-05-09 DIAGNOSIS — G894 Chronic pain syndrome: Secondary | ICD-10-CM | POA: Diagnosis not present

## 2020-05-09 DIAGNOSIS — G609 Hereditary and idiopathic neuropathy, unspecified: Secondary | ICD-10-CM | POA: Diagnosis not present

## 2020-05-22 DIAGNOSIS — D485 Neoplasm of uncertain behavior of skin: Secondary | ICD-10-CM | POA: Diagnosis not present

## 2020-05-22 DIAGNOSIS — L570 Actinic keratosis: Secondary | ICD-10-CM | POA: Diagnosis not present

## 2020-05-22 DIAGNOSIS — D2239 Melanocytic nevi of other parts of face: Secondary | ICD-10-CM | POA: Diagnosis not present

## 2020-06-07 DIAGNOSIS — G894 Chronic pain syndrome: Secondary | ICD-10-CM | POA: Diagnosis not present

## 2020-06-07 DIAGNOSIS — G609 Hereditary and idiopathic neuropathy, unspecified: Secondary | ICD-10-CM | POA: Diagnosis not present

## 2020-06-07 DIAGNOSIS — M5137 Other intervertebral disc degeneration, lumbosacral region: Secondary | ICD-10-CM | POA: Diagnosis not present

## 2020-06-07 DIAGNOSIS — G629 Polyneuropathy, unspecified: Secondary | ICD-10-CM | POA: Diagnosis not present

## 2020-06-07 DIAGNOSIS — Z1389 Encounter for screening for other disorder: Secondary | ICD-10-CM | POA: Diagnosis not present

## 2020-06-25 ENCOUNTER — Ambulatory Visit: Payer: Medicare HMO | Admitting: Neurology

## 2020-06-25 ENCOUNTER — Other Ambulatory Visit: Payer: Self-pay

## 2020-06-25 ENCOUNTER — Encounter: Payer: Self-pay | Admitting: Neurology

## 2020-06-25 VITALS — BP 132/79 | HR 82 | Ht 73.0 in | Wt 225.6 lb

## 2020-06-25 DIAGNOSIS — G603 Idiopathic progressive neuropathy: Secondary | ICD-10-CM

## 2020-06-25 NOTE — Progress Notes (Signed)
Follow-up Visit   Date: 06/25/20   Joel Gross MRN: [id number] DOB: 16-Aug-1951   Interim History: Joel Gross is a 69 y.o. right-handed Caucasian male with depression, hypertension, and neuropathy returning to the clinic for follow-up of idiopathic neuropathy.  The patient was accompanied to the clinic by wife who also provides collateral information.    History of present illness: He has 10 year history of peripheral neuropathy involving the feet which was intermittent at first and since 2014, symptoms have become constant.  He was evaluated at [facility] and had NCS/EMG of the legs which was consistent with neuropathy.  He has severe painful peripheral neuropathy with spinal cord stimulator and is followed by pain management.  He takes Lyrica 200mg  TID, amitriptyline 50mg  at bedtime (depression and pain), Cymbalta 120mg  (depression and pain), hydrocodone 7.5mg  4 times daily.  His pain is well-controlled on this regimen.  He has mild imbalance and had three falls over the past year.  He does not use gait assist device.  He denies weakness of the legs. No paresthesia of the hands.   UPDATE 06/25/2020: He is here for follow-up with his wife. There has been no significant change in his neuropathy, which continues to involve the feet and lower legs. He has predominantly painful paresthesias and sees pain management for this. He takes Lyrica 200 mg 3 times daily, amitriptyline 50 mg at bedtime, Cymbalta 120 mg, and hydrocodone 7.5 mg 4 times daily. He also has a spinal cord stimulator which needs battery replacement. He denies any weakness. He has suffered a few falls, mostly out of bed. They have seen several advertisements for various alternative treatments for neuropathy and wanted my opinion. They also wanted to see if there were any therapeutic options available.  Medications:  Current Outpatient Medications on File Prior to Visit  Medication Sig Dispense Refill  . amitriptyline  (ELAVIL) 50 MG tablet Take 1 tablet (50 mg total) by mouth at bedtime. 90 tablet 0  . amLODipine (NORVASC) 10 MG tablet Take 10 mg by mouth daily.    . cyclobenzaprine (FLEXERIL) 10 MG tablet Take 10 mg by mouth at bedtime. As needed    . DULoxetine (CYMBALTA) 60 MG capsule TAKE 2 CAPSULES BY MOUTH DAILY 180 capsule 1  . HYDROcodone-acetaminophen (NORCO) 7.5-325 MG tablet Take 1 tablet by mouth 3 (three) times daily.    Marland Kitchen lisinopril (PRINIVIL,ZESTRIL) 40 MG tablet Take 40 mg by mouth daily.    . potassium chloride SA (K-DUR,KLOR-CON) 20 MEQ tablet Take 20 mEq by mouth 2 (two) times daily.    . pravastatin (PRAVACHOL) 20 MG tablet Take 40 mg by mouth daily.     . pregabalin (LYRICA) 200 MG capsule Take 1 capsule (200 mg total) by mouth 3 (three) times daily. 90 capsule 0  . busPIRone (BUSPAR) 5 MG tablet Take 1 tablet (5 mg total) by mouth 3 (three) times daily. (Patient not taking: Reported on 06/25/2020) 270 tablet 0   No current facility-administered medications on file prior to visit.    Allergies:  Allergies  Allergen Reactions  . 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 Hctz [Hydrochlorothiazide]     Vital Signs:  BP 132/79 (BP Location: Left Arm, Patient Position: Sitting, Cuff Size: Normal)   Pulse 82   Ht 6\' 1"  (1.854 m)   Wt 225 lb 9.6 oz (102.3 kg)   SpO2 96%   BMI 29.76 kg/m   Neurological Exam: MENTAL STATUS  including orientation to time, place, person, recent and remote memory, attention span and concentration, language, and fund of knowledge is normal.  Speech is not dysarthric.  CRANIAL NERVES:  No visual field defects.  Pupils equal round and reactive to light.  Normal conjugate, extra-ocular eye movements in all directions of gaze.  No ptosis.  Face is symmetric.   MOTOR:  Motor strength is 5/5 in all extremities, including distally.  No atrophy, fasciculations or abnormal movements.  No pronator drift.  Tone is normal.    MSRs:  Reflexes  are 2+/4 throughout except absent at the ankles bilaterally..  SENSORY: Vibration is reduced at the ankles and absent at the toes bilaterally. Temperature and pinprick is reduced distal to the ankles. Sensation is intact in the arms.Marland Kitchen  COORDINATION/GAIT:  Normal finger-to- nose-finger.  Intact rapid alternating movements bilaterally.  Gait narrow based and stable.   Data: n/a  IMPRESSION/PLAN: Idiopathic peripheral neuropathy, manifested by painful paresthesias of the legs. Clinically stable without signs of progression. Unfortunately, there has been no new advancements for treatment of neuropathy and I do not endorse alternative therapies which are not FDA approved. Patient is welcome to explore this at his on accord. He is most affected by pain which is followed by pain management. I encouraged him to follow-up with pain management for battery replacement of his spinal cord stimulator. Patient has many questions which I answered. Patient educated on daily foot inspection, fall prevention, and safety precautions around the home.  Return to clinic in 1 year.  Total time spent reviewing records, interview, history/exam, documentation, and coordination of care on day of encounter:  30 min     Thank you for allowing me to participate in patient's care.  If I can answer any additional questions, I would be pleased to do so.    Sincerely,    Jaki Steptoe K. Posey Pronto, DO

## 2020-06-25 NOTE — Patient Instructions (Addendum)
Continue to check your feet daily  Take care when walking on uneven ground

## 2020-07-02 DIAGNOSIS — C44319 Basal cell carcinoma of skin of other parts of face: Secondary | ICD-10-CM | POA: Diagnosis not present

## 2020-07-02 DIAGNOSIS — R238 Other skin changes: Secondary | ICD-10-CM | POA: Diagnosis not present

## 2020-07-02 DIAGNOSIS — D1801 Hemangioma of skin and subcutaneous tissue: Secondary | ICD-10-CM | POA: Diagnosis not present

## 2020-07-02 DIAGNOSIS — L570 Actinic keratosis: Secondary | ICD-10-CM | POA: Diagnosis not present

## 2020-07-02 DIAGNOSIS — L814 Other melanin hyperpigmentation: Secondary | ICD-10-CM | POA: Diagnosis not present

## 2020-07-02 DIAGNOSIS — Z85828 Personal history of other malignant neoplasm of skin: Secondary | ICD-10-CM | POA: Diagnosis not present

## 2020-07-02 DIAGNOSIS — L905 Scar conditions and fibrosis of skin: Secondary | ICD-10-CM | POA: Diagnosis not present

## 2020-07-02 DIAGNOSIS — L578 Other skin changes due to chronic exposure to nonionizing radiation: Secondary | ICD-10-CM | POA: Diagnosis not present

## 2020-07-02 DIAGNOSIS — D485 Neoplasm of uncertain behavior of skin: Secondary | ICD-10-CM | POA: Diagnosis not present

## 2020-07-05 DIAGNOSIS — G609 Hereditary and idiopathic neuropathy, unspecified: Secondary | ICD-10-CM | POA: Diagnosis not present

## 2020-07-05 DIAGNOSIS — G629 Polyneuropathy, unspecified: Secondary | ICD-10-CM | POA: Diagnosis not present

## 2020-07-05 DIAGNOSIS — G894 Chronic pain syndrome: Secondary | ICD-10-CM | POA: Diagnosis not present

## 2020-07-05 DIAGNOSIS — M5137 Other intervertebral disc degeneration, lumbosacral region: Secondary | ICD-10-CM | POA: Diagnosis not present

## 2020-07-05 DIAGNOSIS — Z1389 Encounter for screening for other disorder: Secondary | ICD-10-CM | POA: Diagnosis not present

## 2020-07-10 DIAGNOSIS — F5101 Primary insomnia: Secondary | ICD-10-CM | POA: Diagnosis not present

## 2020-07-10 DIAGNOSIS — R69 Illness, unspecified: Secondary | ICD-10-CM | POA: Diagnosis not present

## 2020-07-10 DIAGNOSIS — F3341 Major depressive disorder, recurrent, in partial remission: Secondary | ICD-10-CM | POA: Diagnosis not present

## 2020-07-23 DIAGNOSIS — T2122XA Burn of second degree of abdominal wall, initial encounter: Secondary | ICD-10-CM | POA: Diagnosis not present

## 2020-07-23 DIAGNOSIS — Z1331 Encounter for screening for depression: Secondary | ICD-10-CM | POA: Diagnosis not present

## 2020-07-23 DIAGNOSIS — Z0000 Encounter for general adult medical examination without abnormal findings: Secondary | ICD-10-CM | POA: Diagnosis not present

## 2020-07-23 DIAGNOSIS — Z9181 History of falling: Secondary | ICD-10-CM | POA: Diagnosis not present

## 2020-07-23 DIAGNOSIS — M25552 Pain in left hip: Secondary | ICD-10-CM | POA: Diagnosis not present

## 2020-07-23 DIAGNOSIS — Z6831 Body mass index (BMI) 31.0-31.9, adult: Secondary | ICD-10-CM | POA: Diagnosis not present

## 2020-07-30 DIAGNOSIS — R2689 Other abnormalities of gait and mobility: Secondary | ICD-10-CM | POA: Diagnosis not present

## 2020-07-30 DIAGNOSIS — M25552 Pain in left hip: Secondary | ICD-10-CM | POA: Diagnosis not present

## 2020-07-30 DIAGNOSIS — M62552 Muscle wasting and atrophy, not elsewhere classified, left thigh: Secondary | ICD-10-CM | POA: Diagnosis not present

## 2020-07-30 DIAGNOSIS — M25562 Pain in left knee: Secondary | ICD-10-CM | POA: Diagnosis not present

## 2020-08-02 DIAGNOSIS — M25552 Pain in left hip: Secondary | ICD-10-CM | POA: Diagnosis not present

## 2020-08-02 DIAGNOSIS — R2689 Other abnormalities of gait and mobility: Secondary | ICD-10-CM | POA: Diagnosis not present

## 2020-08-02 DIAGNOSIS — M62552 Muscle wasting and atrophy, not elsewhere classified, left thigh: Secondary | ICD-10-CM | POA: Diagnosis not present

## 2020-08-02 DIAGNOSIS — M25562 Pain in left knee: Secondary | ICD-10-CM | POA: Diagnosis not present

## 2020-08-06 DIAGNOSIS — M25562 Pain in left knee: Secondary | ICD-10-CM | POA: Diagnosis not present

## 2020-08-06 DIAGNOSIS — R2689 Other abnormalities of gait and mobility: Secondary | ICD-10-CM | POA: Diagnosis not present

## 2020-08-06 DIAGNOSIS — M62552 Muscle wasting and atrophy, not elsewhere classified, left thigh: Secondary | ICD-10-CM | POA: Diagnosis not present

## 2020-08-06 DIAGNOSIS — M25552 Pain in left hip: Secondary | ICD-10-CM | POA: Diagnosis not present

## 2020-08-09 DIAGNOSIS — M5137 Other intervertebral disc degeneration, lumbosacral region: Secondary | ICD-10-CM | POA: Diagnosis not present

## 2020-08-09 DIAGNOSIS — Z1389 Encounter for screening for other disorder: Secondary | ICD-10-CM | POA: Diagnosis not present

## 2020-08-09 DIAGNOSIS — R2689 Other abnormalities of gait and mobility: Secondary | ICD-10-CM | POA: Diagnosis not present

## 2020-08-09 DIAGNOSIS — M25562 Pain in left knee: Secondary | ICD-10-CM | POA: Diagnosis not present

## 2020-08-09 DIAGNOSIS — G894 Chronic pain syndrome: Secondary | ICD-10-CM | POA: Diagnosis not present

## 2020-08-09 DIAGNOSIS — G609 Hereditary and idiopathic neuropathy, unspecified: Secondary | ICD-10-CM | POA: Diagnosis not present

## 2020-08-09 DIAGNOSIS — G629 Polyneuropathy, unspecified: Secondary | ICD-10-CM | POA: Diagnosis not present

## 2020-08-09 DIAGNOSIS — M25552 Pain in left hip: Secondary | ICD-10-CM | POA: Diagnosis not present

## 2020-08-09 DIAGNOSIS — M62552 Muscle wasting and atrophy, not elsewhere classified, left thigh: Secondary | ICD-10-CM | POA: Diagnosis not present

## 2020-08-14 DIAGNOSIS — M25562 Pain in left knee: Secondary | ICD-10-CM | POA: Diagnosis not present

## 2020-08-14 DIAGNOSIS — R2689 Other abnormalities of gait and mobility: Secondary | ICD-10-CM | POA: Diagnosis not present

## 2020-08-14 DIAGNOSIS — M62552 Muscle wasting and atrophy, not elsewhere classified, left thigh: Secondary | ICD-10-CM | POA: Diagnosis not present

## 2020-08-14 DIAGNOSIS — M25552 Pain in left hip: Secondary | ICD-10-CM | POA: Diagnosis not present

## 2020-08-16 DIAGNOSIS — R2689 Other abnormalities of gait and mobility: Secondary | ICD-10-CM | POA: Diagnosis not present

## 2020-08-16 DIAGNOSIS — M9904 Segmental and somatic dysfunction of sacral region: Secondary | ICD-10-CM | POA: Diagnosis not present

## 2020-08-16 DIAGNOSIS — M25552 Pain in left hip: Secondary | ICD-10-CM | POA: Diagnosis not present

## 2020-08-16 DIAGNOSIS — M62552 Muscle wasting and atrophy, not elsewhere classified, left thigh: Secondary | ICD-10-CM | POA: Diagnosis not present

## 2020-08-16 DIAGNOSIS — M9903 Segmental and somatic dysfunction of lumbar region: Secondary | ICD-10-CM | POA: Diagnosis not present

## 2020-08-16 DIAGNOSIS — M25562 Pain in left knee: Secondary | ICD-10-CM | POA: Diagnosis not present

## 2020-08-16 DIAGNOSIS — M5442 Lumbago with sciatica, left side: Secondary | ICD-10-CM | POA: Diagnosis not present

## 2020-08-16 DIAGNOSIS — M5137 Other intervertebral disc degeneration, lumbosacral region: Secondary | ICD-10-CM | POA: Diagnosis not present

## 2020-08-16 DIAGNOSIS — M5136 Other intervertebral disc degeneration, lumbar region: Secondary | ICD-10-CM | POA: Diagnosis not present

## 2020-08-20 DIAGNOSIS — M5137 Other intervertebral disc degeneration, lumbosacral region: Secondary | ICD-10-CM | POA: Diagnosis not present

## 2020-08-20 DIAGNOSIS — M9904 Segmental and somatic dysfunction of sacral region: Secondary | ICD-10-CM | POA: Diagnosis not present

## 2020-08-20 DIAGNOSIS — M5136 Other intervertebral disc degeneration, lumbar region: Secondary | ICD-10-CM | POA: Diagnosis not present

## 2020-08-20 DIAGNOSIS — M5442 Lumbago with sciatica, left side: Secondary | ICD-10-CM | POA: Diagnosis not present

## 2020-08-20 DIAGNOSIS — M9903 Segmental and somatic dysfunction of lumbar region: Secondary | ICD-10-CM | POA: Diagnosis not present

## 2020-08-21 DIAGNOSIS — R2689 Other abnormalities of gait and mobility: Secondary | ICD-10-CM | POA: Diagnosis not present

## 2020-08-21 DIAGNOSIS — M25552 Pain in left hip: Secondary | ICD-10-CM | POA: Diagnosis not present

## 2020-08-21 DIAGNOSIS — M62552 Muscle wasting and atrophy, not elsewhere classified, left thigh: Secondary | ICD-10-CM | POA: Diagnosis not present

## 2020-08-21 DIAGNOSIS — M25562 Pain in left knee: Secondary | ICD-10-CM | POA: Diagnosis not present

## 2020-08-22 DIAGNOSIS — M5442 Lumbago with sciatica, left side: Secondary | ICD-10-CM | POA: Diagnosis not present

## 2020-08-22 DIAGNOSIS — M5137 Other intervertebral disc degeneration, lumbosacral region: Secondary | ICD-10-CM | POA: Diagnosis not present

## 2020-08-22 DIAGNOSIS — M5136 Other intervertebral disc degeneration, lumbar region: Secondary | ICD-10-CM | POA: Diagnosis not present

## 2020-08-22 DIAGNOSIS — M9903 Segmental and somatic dysfunction of lumbar region: Secondary | ICD-10-CM | POA: Diagnosis not present

## 2020-08-22 DIAGNOSIS — M9904 Segmental and somatic dysfunction of sacral region: Secondary | ICD-10-CM | POA: Diagnosis not present

## 2020-08-24 DIAGNOSIS — M5137 Other intervertebral disc degeneration, lumbosacral region: Secondary | ICD-10-CM | POA: Diagnosis not present

## 2020-08-24 DIAGNOSIS — M9904 Segmental and somatic dysfunction of sacral region: Secondary | ICD-10-CM | POA: Diagnosis not present

## 2020-08-24 DIAGNOSIS — M5442 Lumbago with sciatica, left side: Secondary | ICD-10-CM | POA: Diagnosis not present

## 2020-08-24 DIAGNOSIS — M5136 Other intervertebral disc degeneration, lumbar region: Secondary | ICD-10-CM | POA: Diagnosis not present

## 2020-08-24 DIAGNOSIS — M9903 Segmental and somatic dysfunction of lumbar region: Secondary | ICD-10-CM | POA: Diagnosis not present

## 2020-08-27 DIAGNOSIS — M5442 Lumbago with sciatica, left side: Secondary | ICD-10-CM | POA: Diagnosis not present

## 2020-08-27 DIAGNOSIS — M9904 Segmental and somatic dysfunction of sacral region: Secondary | ICD-10-CM | POA: Diagnosis not present

## 2020-08-27 DIAGNOSIS — M5136 Other intervertebral disc degeneration, lumbar region: Secondary | ICD-10-CM | POA: Diagnosis not present

## 2020-08-27 DIAGNOSIS — M5137 Other intervertebral disc degeneration, lumbosacral region: Secondary | ICD-10-CM | POA: Diagnosis not present

## 2020-08-27 DIAGNOSIS — M9903 Segmental and somatic dysfunction of lumbar region: Secondary | ICD-10-CM | POA: Diagnosis not present

## 2020-08-29 DIAGNOSIS — M5136 Other intervertebral disc degeneration, lumbar region: Secondary | ICD-10-CM | POA: Diagnosis not present

## 2020-08-29 DIAGNOSIS — M5137 Other intervertebral disc degeneration, lumbosacral region: Secondary | ICD-10-CM | POA: Diagnosis not present

## 2020-08-29 DIAGNOSIS — M5442 Lumbago with sciatica, left side: Secondary | ICD-10-CM | POA: Diagnosis not present

## 2020-08-29 DIAGNOSIS — M9903 Segmental and somatic dysfunction of lumbar region: Secondary | ICD-10-CM | POA: Diagnosis not present

## 2020-08-29 DIAGNOSIS — M9904 Segmental and somatic dysfunction of sacral region: Secondary | ICD-10-CM | POA: Diagnosis not present

## 2020-08-30 DIAGNOSIS — M62552 Muscle wasting and atrophy, not elsewhere classified, left thigh: Secondary | ICD-10-CM | POA: Diagnosis not present

## 2020-08-30 DIAGNOSIS — M25552 Pain in left hip: Secondary | ICD-10-CM | POA: Diagnosis not present

## 2020-08-30 DIAGNOSIS — M5136 Other intervertebral disc degeneration, lumbar region: Secondary | ICD-10-CM | POA: Diagnosis not present

## 2020-08-30 DIAGNOSIS — R2689 Other abnormalities of gait and mobility: Secondary | ICD-10-CM | POA: Diagnosis not present

## 2020-08-30 DIAGNOSIS — M9903 Segmental and somatic dysfunction of lumbar region: Secondary | ICD-10-CM | POA: Diagnosis not present

## 2020-08-30 DIAGNOSIS — M5137 Other intervertebral disc degeneration, lumbosacral region: Secondary | ICD-10-CM | POA: Diagnosis not present

## 2020-08-30 DIAGNOSIS — M25562 Pain in left knee: Secondary | ICD-10-CM | POA: Diagnosis not present

## 2020-08-30 DIAGNOSIS — M9904 Segmental and somatic dysfunction of sacral region: Secondary | ICD-10-CM | POA: Diagnosis not present

## 2020-08-30 DIAGNOSIS — M5442 Lumbago with sciatica, left side: Secondary | ICD-10-CM | POA: Diagnosis not present

## 2020-09-03 DIAGNOSIS — M5442 Lumbago with sciatica, left side: Secondary | ICD-10-CM | POA: Diagnosis not present

## 2020-09-03 DIAGNOSIS — M5137 Other intervertebral disc degeneration, lumbosacral region: Secondary | ICD-10-CM | POA: Diagnosis not present

## 2020-09-03 DIAGNOSIS — M9904 Segmental and somatic dysfunction of sacral region: Secondary | ICD-10-CM | POA: Diagnosis not present

## 2020-09-03 DIAGNOSIS — M9903 Segmental and somatic dysfunction of lumbar region: Secondary | ICD-10-CM | POA: Diagnosis not present

## 2020-09-03 DIAGNOSIS — M5136 Other intervertebral disc degeneration, lumbar region: Secondary | ICD-10-CM | POA: Diagnosis not present

## 2020-09-05 DIAGNOSIS — M5136 Other intervertebral disc degeneration, lumbar region: Secondary | ICD-10-CM | POA: Diagnosis not present

## 2020-09-05 DIAGNOSIS — M9904 Segmental and somatic dysfunction of sacral region: Secondary | ICD-10-CM | POA: Diagnosis not present

## 2020-09-05 DIAGNOSIS — M9903 Segmental and somatic dysfunction of lumbar region: Secondary | ICD-10-CM | POA: Diagnosis not present

## 2020-09-05 DIAGNOSIS — M5137 Other intervertebral disc degeneration, lumbosacral region: Secondary | ICD-10-CM | POA: Diagnosis not present

## 2020-09-05 DIAGNOSIS — M5442 Lumbago with sciatica, left side: Secondary | ICD-10-CM | POA: Diagnosis not present

## 2020-09-06 DIAGNOSIS — M62552 Muscle wasting and atrophy, not elsewhere classified, left thigh: Secondary | ICD-10-CM | POA: Diagnosis not present

## 2020-09-06 DIAGNOSIS — M25562 Pain in left knee: Secondary | ICD-10-CM | POA: Diagnosis not present

## 2020-09-06 DIAGNOSIS — R2689 Other abnormalities of gait and mobility: Secondary | ICD-10-CM | POA: Diagnosis not present

## 2020-09-06 DIAGNOSIS — M25552 Pain in left hip: Secondary | ICD-10-CM | POA: Diagnosis not present

## 2020-09-12 DIAGNOSIS — M5136 Other intervertebral disc degeneration, lumbar region: Secondary | ICD-10-CM | POA: Diagnosis not present

## 2020-09-12 DIAGNOSIS — M5442 Lumbago with sciatica, left side: Secondary | ICD-10-CM | POA: Diagnosis not present

## 2020-09-12 DIAGNOSIS — M9903 Segmental and somatic dysfunction of lumbar region: Secondary | ICD-10-CM | POA: Diagnosis not present

## 2020-09-12 DIAGNOSIS — M5137 Other intervertebral disc degeneration, lumbosacral region: Secondary | ICD-10-CM | POA: Diagnosis not present

## 2020-09-12 DIAGNOSIS — M9904 Segmental and somatic dysfunction of sacral region: Secondary | ICD-10-CM | POA: Diagnosis not present

## 2020-09-13 DIAGNOSIS — M9903 Segmental and somatic dysfunction of lumbar region: Secondary | ICD-10-CM | POA: Diagnosis not present

## 2020-09-13 DIAGNOSIS — M5442 Lumbago with sciatica, left side: Secondary | ICD-10-CM | POA: Diagnosis not present

## 2020-09-13 DIAGNOSIS — M5136 Other intervertebral disc degeneration, lumbar region: Secondary | ICD-10-CM | POA: Diagnosis not present

## 2020-09-13 DIAGNOSIS — M5137 Other intervertebral disc degeneration, lumbosacral region: Secondary | ICD-10-CM | POA: Diagnosis not present

## 2020-09-13 DIAGNOSIS — M9904 Segmental and somatic dysfunction of sacral region: Secondary | ICD-10-CM | POA: Diagnosis not present

## 2020-09-17 DIAGNOSIS — M5442 Lumbago with sciatica, left side: Secondary | ICD-10-CM | POA: Diagnosis not present

## 2020-09-17 DIAGNOSIS — M5136 Other intervertebral disc degeneration, lumbar region: Secondary | ICD-10-CM | POA: Diagnosis not present

## 2020-09-17 DIAGNOSIS — M9904 Segmental and somatic dysfunction of sacral region: Secondary | ICD-10-CM | POA: Diagnosis not present

## 2020-09-17 DIAGNOSIS — M5137 Other intervertebral disc degeneration, lumbosacral region: Secondary | ICD-10-CM | POA: Diagnosis not present

## 2020-09-17 DIAGNOSIS — M9903 Segmental and somatic dysfunction of lumbar region: Secondary | ICD-10-CM | POA: Diagnosis not present

## 2020-09-19 DIAGNOSIS — M5442 Lumbago with sciatica, left side: Secondary | ICD-10-CM | POA: Diagnosis not present

## 2020-09-19 DIAGNOSIS — M9904 Segmental and somatic dysfunction of sacral region: Secondary | ICD-10-CM | POA: Diagnosis not present

## 2020-09-19 DIAGNOSIS — M5136 Other intervertebral disc degeneration, lumbar region: Secondary | ICD-10-CM | POA: Diagnosis not present

## 2020-09-19 DIAGNOSIS — M9903 Segmental and somatic dysfunction of lumbar region: Secondary | ICD-10-CM | POA: Diagnosis not present

## 2020-09-19 DIAGNOSIS — M5137 Other intervertebral disc degeneration, lumbosacral region: Secondary | ICD-10-CM | POA: Diagnosis not present

## 2020-09-20 DIAGNOSIS — M5137 Other intervertebral disc degeneration, lumbosacral region: Secondary | ICD-10-CM | POA: Diagnosis not present

## 2020-09-20 DIAGNOSIS — M5442 Lumbago with sciatica, left side: Secondary | ICD-10-CM | POA: Diagnosis not present

## 2020-09-20 DIAGNOSIS — M5136 Other intervertebral disc degeneration, lumbar region: Secondary | ICD-10-CM | POA: Diagnosis not present

## 2020-09-20 DIAGNOSIS — M9903 Segmental and somatic dysfunction of lumbar region: Secondary | ICD-10-CM | POA: Diagnosis not present

## 2020-09-20 DIAGNOSIS — M9904 Segmental and somatic dysfunction of sacral region: Secondary | ICD-10-CM | POA: Diagnosis not present

## 2020-09-24 DIAGNOSIS — G894 Chronic pain syndrome: Secondary | ICD-10-CM | POA: Diagnosis not present

## 2020-09-24 DIAGNOSIS — Z1389 Encounter for screening for other disorder: Secondary | ICD-10-CM | POA: Diagnosis not present

## 2020-09-24 DIAGNOSIS — M9904 Segmental and somatic dysfunction of sacral region: Secondary | ICD-10-CM | POA: Diagnosis not present

## 2020-09-24 DIAGNOSIS — M5136 Other intervertebral disc degeneration, lumbar region: Secondary | ICD-10-CM | POA: Diagnosis not present

## 2020-09-24 DIAGNOSIS — M9903 Segmental and somatic dysfunction of lumbar region: Secondary | ICD-10-CM | POA: Diagnosis not present

## 2020-09-24 DIAGNOSIS — M5137 Other intervertebral disc degeneration, lumbosacral region: Secondary | ICD-10-CM | POA: Diagnosis not present

## 2020-09-24 DIAGNOSIS — M5442 Lumbago with sciatica, left side: Secondary | ICD-10-CM | POA: Diagnosis not present

## 2020-09-24 DIAGNOSIS — G629 Polyneuropathy, unspecified: Secondary | ICD-10-CM | POA: Diagnosis not present

## 2020-09-24 DIAGNOSIS — G609 Hereditary and idiopathic neuropathy, unspecified: Secondary | ICD-10-CM | POA: Diagnosis not present

## 2020-10-01 DIAGNOSIS — Z125 Encounter for screening for malignant neoplasm of prostate: Secondary | ICD-10-CM | POA: Diagnosis not present

## 2020-10-01 DIAGNOSIS — Z0000 Encounter for general adult medical examination without abnormal findings: Secondary | ICD-10-CM | POA: Diagnosis not present

## 2020-10-01 DIAGNOSIS — Z79899 Other long term (current) drug therapy: Secondary | ICD-10-CM | POA: Diagnosis not present

## 2020-10-01 DIAGNOSIS — Z23 Encounter for immunization: Secondary | ICD-10-CM | POA: Diagnosis not present

## 2020-10-01 DIAGNOSIS — Z6829 Body mass index (BMI) 29.0-29.9, adult: Secondary | ICD-10-CM | POA: Diagnosis not present

## 2020-10-01 DIAGNOSIS — E7800 Pure hypercholesterolemia, unspecified: Secondary | ICD-10-CM | POA: Diagnosis not present

## 2020-10-04 DIAGNOSIS — Z85828 Personal history of other malignant neoplasm of skin: Secondary | ICD-10-CM | POA: Diagnosis not present

## 2020-10-04 DIAGNOSIS — D485 Neoplasm of uncertain behavior of skin: Secondary | ICD-10-CM | POA: Diagnosis not present

## 2020-10-04 DIAGNOSIS — L905 Scar conditions and fibrosis of skin: Secondary | ICD-10-CM | POA: Diagnosis not present

## 2020-10-04 DIAGNOSIS — C44319 Basal cell carcinoma of skin of other parts of face: Secondary | ICD-10-CM | POA: Diagnosis not present

## 2020-10-04 DIAGNOSIS — L570 Actinic keratosis: Secondary | ICD-10-CM | POA: Diagnosis not present

## 2020-10-11 DIAGNOSIS — E876 Hypokalemia: Secondary | ICD-10-CM | POA: Diagnosis not present

## 2020-10-11 DIAGNOSIS — D510 Vitamin B12 deficiency anemia due to intrinsic factor deficiency: Secondary | ICD-10-CM | POA: Diagnosis not present

## 2020-10-16 DIAGNOSIS — K769 Liver disease, unspecified: Secondary | ICD-10-CM | POA: Diagnosis not present

## 2020-11-12 DIAGNOSIS — G629 Polyneuropathy, unspecified: Secondary | ICD-10-CM | POA: Diagnosis not present

## 2020-11-12 DIAGNOSIS — Z1389 Encounter for screening for other disorder: Secondary | ICD-10-CM | POA: Diagnosis not present

## 2020-11-12 DIAGNOSIS — G609 Hereditary and idiopathic neuropathy, unspecified: Secondary | ICD-10-CM | POA: Diagnosis not present

## 2020-11-12 DIAGNOSIS — M5137 Other intervertebral disc degeneration, lumbosacral region: Secondary | ICD-10-CM | POA: Diagnosis not present

## 2020-11-12 DIAGNOSIS — G894 Chronic pain syndrome: Secondary | ICD-10-CM | POA: Diagnosis not present

## 2020-11-22 DIAGNOSIS — R933 Abnormal findings on diagnostic imaging of other parts of digestive tract: Secondary | ICD-10-CM | POA: Diagnosis not present

## 2020-11-28 DIAGNOSIS — J329 Chronic sinusitis, unspecified: Secondary | ICD-10-CM | POA: Diagnosis not present

## 2020-11-28 DIAGNOSIS — Z20828 Contact with and (suspected) exposure to other viral communicable diseases: Secondary | ICD-10-CM | POA: Diagnosis not present

## 2020-11-28 DIAGNOSIS — J310 Chronic rhinitis: Secondary | ICD-10-CM | POA: Diagnosis not present

## 2020-12-02 DIAGNOSIS — D519 Vitamin B12 deficiency anemia, unspecified: Secondary | ICD-10-CM | POA: Diagnosis not present

## 2020-12-02 DIAGNOSIS — G629 Polyneuropathy, unspecified: Secondary | ICD-10-CM | POA: Diagnosis not present

## 2020-12-02 DIAGNOSIS — I10 Essential (primary) hypertension: Secondary | ICD-10-CM | POA: Diagnosis not present

## 2020-12-05 DIAGNOSIS — E7800 Pure hypercholesterolemia, unspecified: Secondary | ICD-10-CM | POA: Diagnosis not present

## 2020-12-05 DIAGNOSIS — Z79899 Other long term (current) drug therapy: Secondary | ICD-10-CM | POA: Diagnosis not present

## 2020-12-05 DIAGNOSIS — D510 Vitamin B12 deficiency anemia due to intrinsic factor deficiency: Secondary | ICD-10-CM | POA: Diagnosis not present

## 2020-12-06 DIAGNOSIS — Z1389 Encounter for screening for other disorder: Secondary | ICD-10-CM | POA: Diagnosis not present

## 2020-12-06 DIAGNOSIS — M5137 Other intervertebral disc degeneration, lumbosacral region: Secondary | ICD-10-CM | POA: Diagnosis not present

## 2020-12-06 DIAGNOSIS — G629 Polyneuropathy, unspecified: Secondary | ICD-10-CM | POA: Diagnosis not present

## 2020-12-06 DIAGNOSIS — G609 Hereditary and idiopathic neuropathy, unspecified: Secondary | ICD-10-CM | POA: Diagnosis not present

## 2020-12-18 DIAGNOSIS — K219 Gastro-esophageal reflux disease without esophagitis: Secondary | ICD-10-CM | POA: Diagnosis not present

## 2020-12-18 DIAGNOSIS — E876 Hypokalemia: Secondary | ICD-10-CM | POA: Diagnosis not present

## 2020-12-18 DIAGNOSIS — Z6829 Body mass index (BMI) 29.0-29.9, adult: Secondary | ICD-10-CM | POA: Diagnosis not present

## 2020-12-18 DIAGNOSIS — I10 Essential (primary) hypertension: Secondary | ICD-10-CM | POA: Diagnosis not present

## 2020-12-18 DIAGNOSIS — R112 Nausea with vomiting, unspecified: Secondary | ICD-10-CM | POA: Diagnosis not present

## 2020-12-18 DIAGNOSIS — R1110 Vomiting, unspecified: Secondary | ICD-10-CM | POA: Diagnosis not present

## 2020-12-18 DIAGNOSIS — R509 Fever, unspecified: Secondary | ICD-10-CM | POA: Diagnosis not present

## 2020-12-18 DIAGNOSIS — K56609 Unspecified intestinal obstruction, unspecified as to partial versus complete obstruction: Secondary | ICD-10-CM | POA: Diagnosis not present

## 2020-12-19 DIAGNOSIS — M1990 Unspecified osteoarthritis, unspecified site: Secondary | ICD-10-CM | POA: Diagnosis not present

## 2020-12-19 DIAGNOSIS — E876 Hypokalemia: Secondary | ICD-10-CM | POA: Diagnosis not present

## 2020-12-19 DIAGNOSIS — K56609 Unspecified intestinal obstruction, unspecified as to partial versus complete obstruction: Secondary | ICD-10-CM | POA: Diagnosis not present

## 2020-12-19 DIAGNOSIS — R509 Fever, unspecified: Secondary | ICD-10-CM | POA: Diagnosis not present

## 2020-12-19 DIAGNOSIS — R112 Nausea with vomiting, unspecified: Secondary | ICD-10-CM | POA: Diagnosis not present

## 2020-12-19 DIAGNOSIS — Z91040 Latex allergy status: Secondary | ICD-10-CM | POA: Diagnosis not present

## 2020-12-19 DIAGNOSIS — I10 Essential (primary) hypertension: Secondary | ICD-10-CM | POA: Diagnosis not present

## 2020-12-19 DIAGNOSIS — R1110 Vomiting, unspecified: Secondary | ICD-10-CM | POA: Diagnosis not present

## 2020-12-19 DIAGNOSIS — G629 Polyneuropathy, unspecified: Secondary | ICD-10-CM | POA: Diagnosis not present

## 2020-12-19 DIAGNOSIS — K219 Gastro-esophageal reflux disease without esophagitis: Secondary | ICD-10-CM | POA: Diagnosis not present

## 2020-12-19 DIAGNOSIS — Z79899 Other long term (current) drug therapy: Secondary | ICD-10-CM | POA: Diagnosis not present

## 2020-12-19 DIAGNOSIS — R69 Illness, unspecified: Secondary | ICD-10-CM | POA: Diagnosis not present

## 2020-12-20 DIAGNOSIS — K219 Gastro-esophageal reflux disease without esophagitis: Secondary | ICD-10-CM | POA: Diagnosis not present

## 2020-12-20 DIAGNOSIS — I10 Essential (primary) hypertension: Secondary | ICD-10-CM | POA: Diagnosis not present

## 2020-12-20 DIAGNOSIS — E876 Hypokalemia: Secondary | ICD-10-CM | POA: Diagnosis not present

## 2020-12-26 DIAGNOSIS — R350 Frequency of micturition: Secondary | ICD-10-CM | POA: Diagnosis not present

## 2020-12-26 DIAGNOSIS — N401 Enlarged prostate with lower urinary tract symptoms: Secondary | ICD-10-CM | POA: Diagnosis not present

## 2020-12-26 DIAGNOSIS — R972 Elevated prostate specific antigen [PSA]: Secondary | ICD-10-CM | POA: Diagnosis not present

## 2020-12-28 DIAGNOSIS — E876 Hypokalemia: Secondary | ICD-10-CM | POA: Diagnosis not present

## 2020-12-28 DIAGNOSIS — I10 Essential (primary) hypertension: Secondary | ICD-10-CM | POA: Diagnosis not present

## 2020-12-28 DIAGNOSIS — Z6829 Body mass index (BMI) 29.0-29.9, adult: Secondary | ICD-10-CM | POA: Diagnosis not present

## 2020-12-31 DIAGNOSIS — D510 Vitamin B12 deficiency anemia due to intrinsic factor deficiency: Secondary | ICD-10-CM | POA: Diagnosis not present

## 2020-12-31 DIAGNOSIS — I10 Essential (primary) hypertension: Secondary | ICD-10-CM | POA: Diagnosis not present

## 2020-12-31 DIAGNOSIS — G629 Polyneuropathy, unspecified: Secondary | ICD-10-CM | POA: Diagnosis not present

## 2021-01-03 DIAGNOSIS — G609 Hereditary and idiopathic neuropathy, unspecified: Secondary | ICD-10-CM | POA: Diagnosis not present

## 2021-01-03 DIAGNOSIS — G629 Polyneuropathy, unspecified: Secondary | ICD-10-CM | POA: Diagnosis not present

## 2021-01-03 DIAGNOSIS — M5137 Other intervertebral disc degeneration, lumbosacral region: Secondary | ICD-10-CM | POA: Diagnosis not present

## 2021-01-03 DIAGNOSIS — G894 Chronic pain syndrome: Secondary | ICD-10-CM | POA: Diagnosis not present

## 2021-01-03 DIAGNOSIS — Z1389 Encounter for screening for other disorder: Secondary | ICD-10-CM | POA: Diagnosis not present

## 2021-01-08 DIAGNOSIS — D510 Vitamin B12 deficiency anemia due to intrinsic factor deficiency: Secondary | ICD-10-CM | POA: Diagnosis not present

## 2021-01-08 DIAGNOSIS — E876 Hypokalemia: Secondary | ICD-10-CM | POA: Diagnosis not present

## 2021-01-09 IMAGING — CT CT ABDOMEN WITHOUT AND WITH CONTRAST
2 of 10 series · 9 of 46 positions shown, 15 images · IV contrast (iopamidol)
Comparison: 01/27/2011 from [facility]

CLINICAL DATA: Hypertension. Evaluate for adrenal
mass/pheochromocytoma.

Creatinine was obtained on site at [HOSPITAL] at [REDACTED].
Results: Creatinine 1.0 mg/dL.
EXAM:
CT ABDOMEN WITHOUT AND WITH CONTRAST
TECHNIQUE: Multidetector CT imaging of the abdomen was performed following the
standard protocol before and following the bolus administration of
intravenous contrast.
CONTRAST:  125mL WAYFWO-4SS IOPAMIDOL (WAYFWO-4SS) INJECTION 61%

[Series 2: adrenals portal venous 3.00 br40 s3 ax axial st · axial · portal-venous · 0.76mm/px · z∈[+1282,+1552]mm · 6 of 127 slices shown, 11 images]
[im 19/127  soft-tissue]
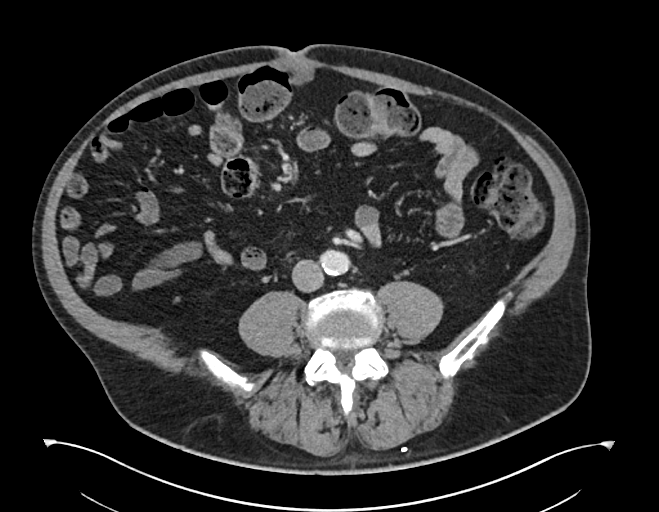
[im 19/127  bone]
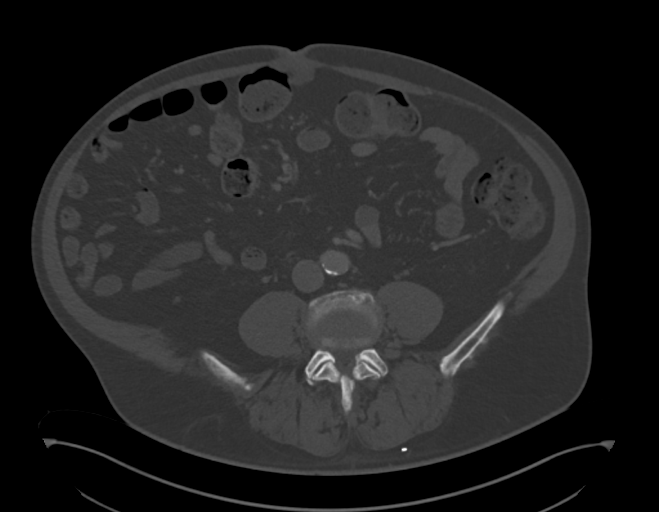
[im 37/127  soft-tissue]
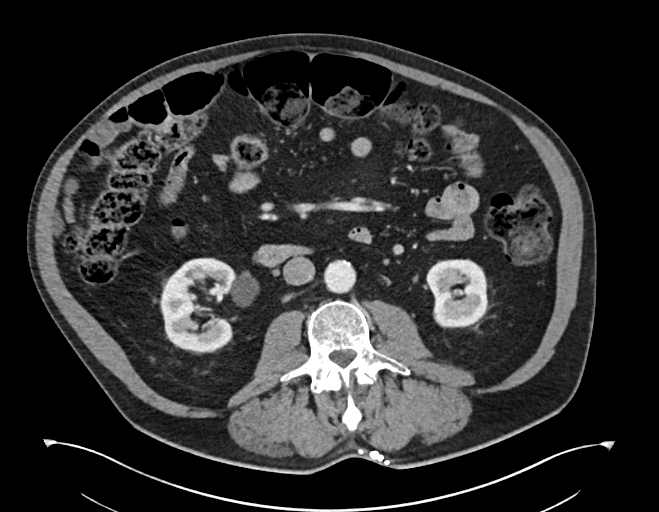
[im 55/127  soft-tissue]
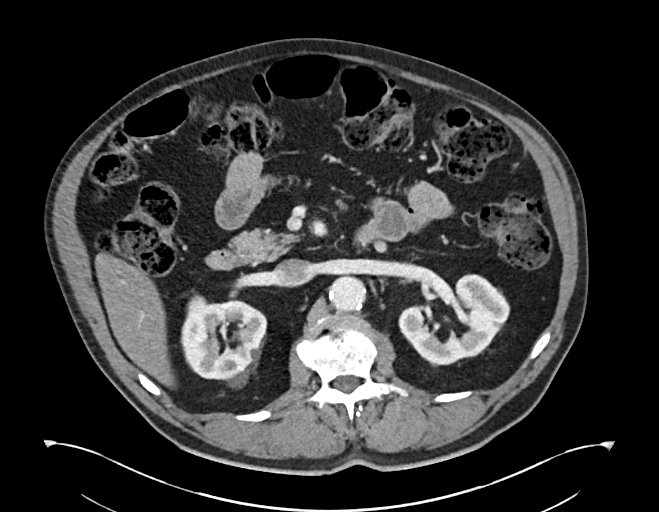
[im 55/127  lung]
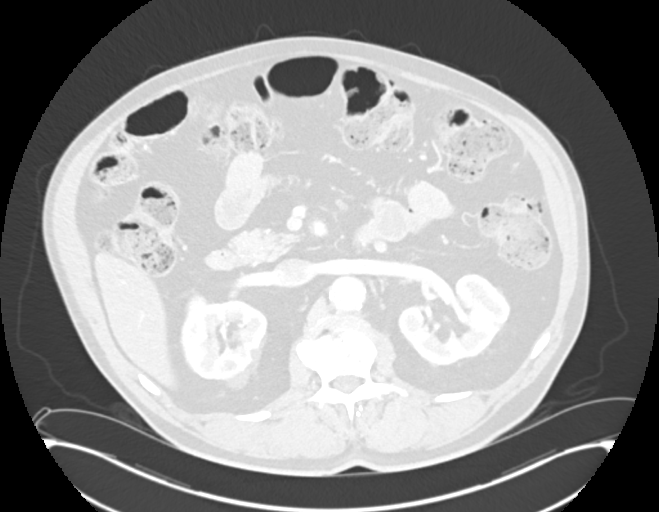
[im 73/127  soft-tissue]
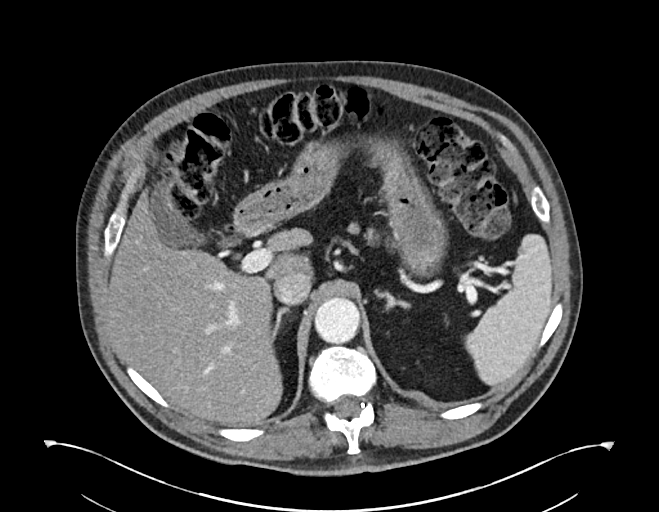
[im 73/127  lung]
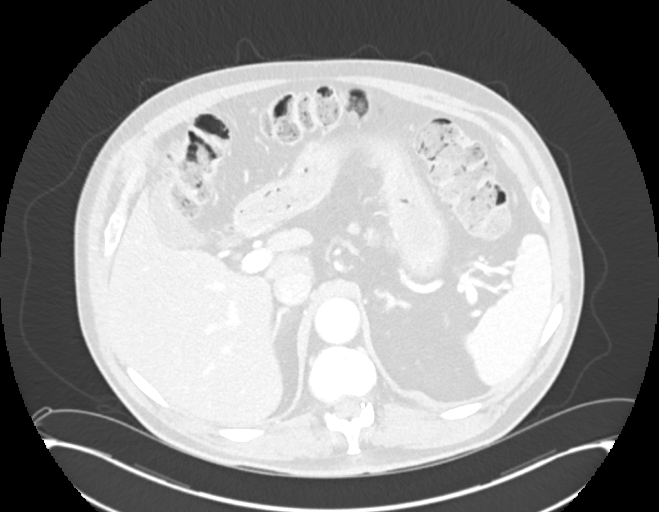
[im 91/127  soft-tissue]
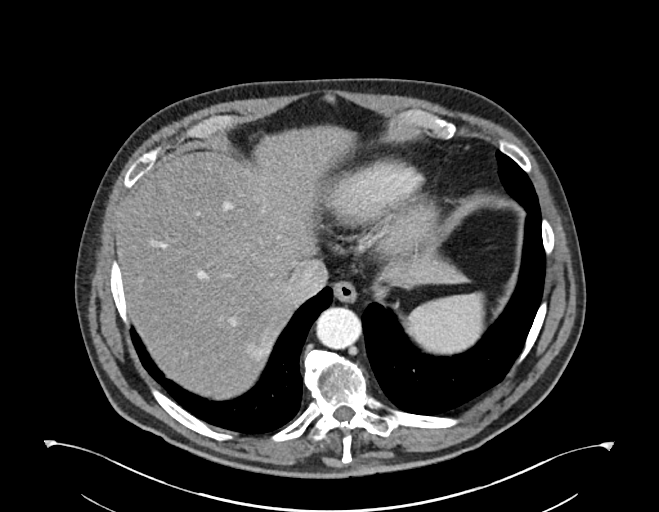
[im 91/127  lung]
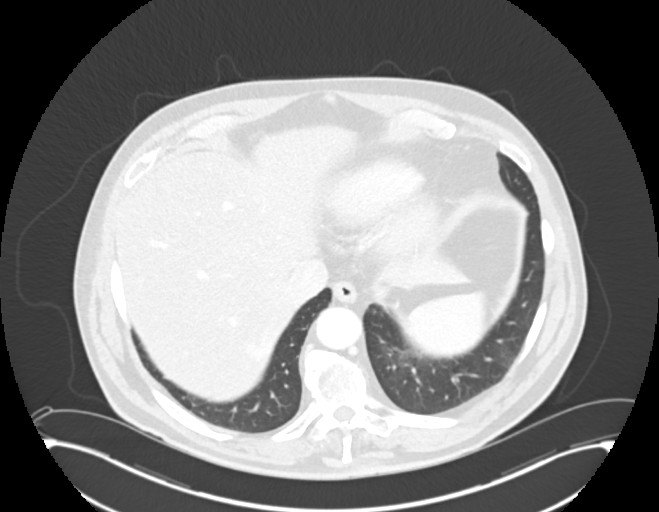
[im 109/127  soft-tissue]
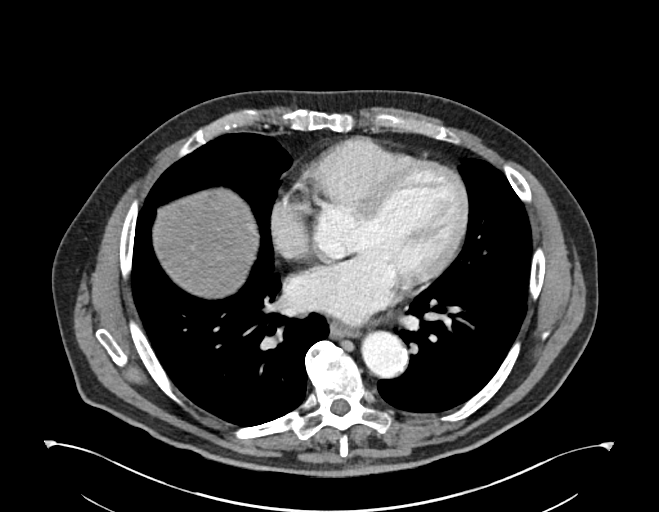
[im 109/127  lung]
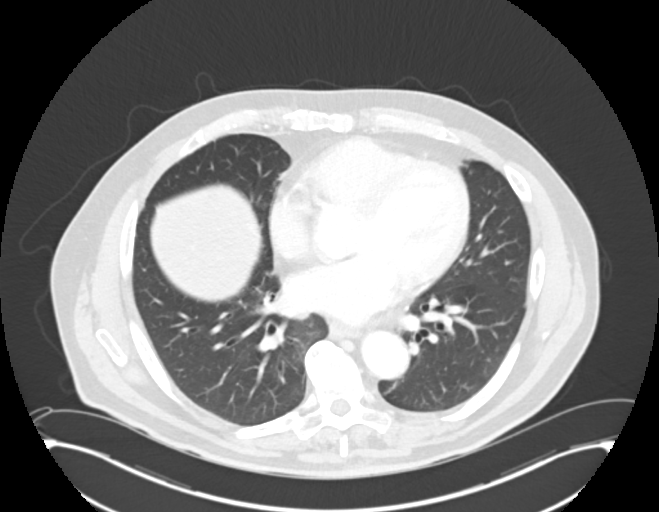

[Series 4: adrenals portal venous 3.00 br40 s3 cor · coronal · portal-venous · 0.75mm/px · 3 of 129 slices shown, 4 images]
[im 33/129  soft-tissue]
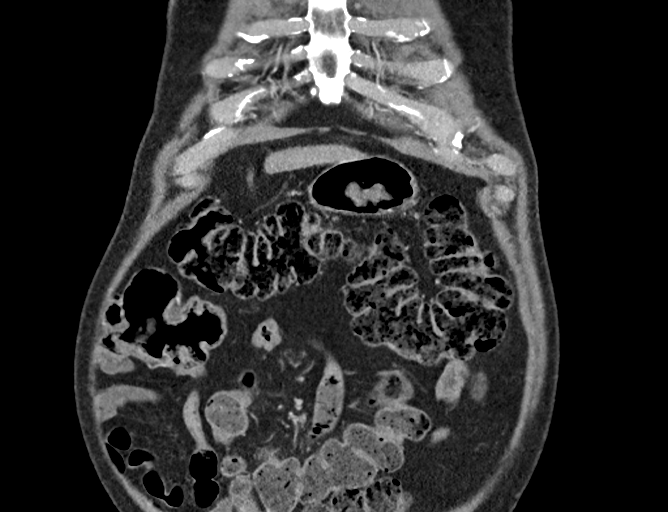
[im 65/129  soft-tissue]
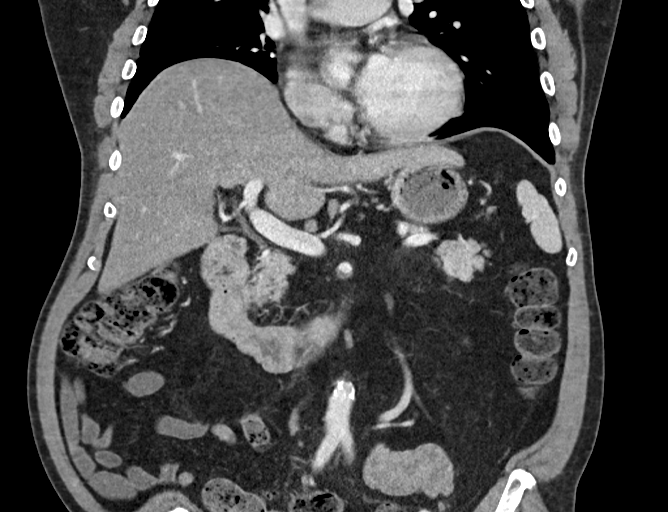
[im 65/129  bone]
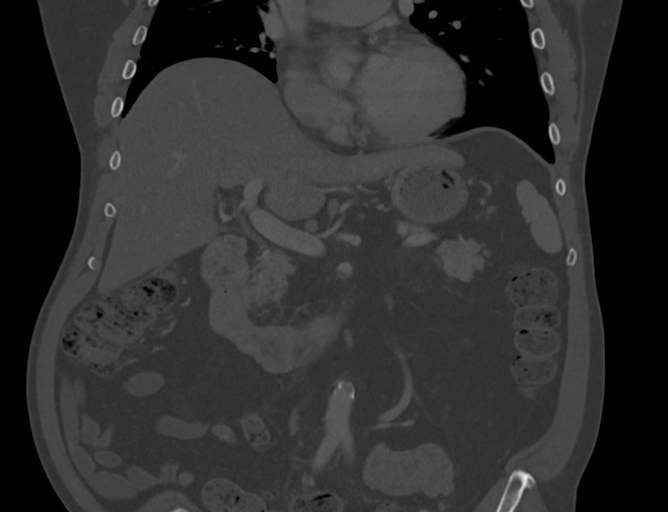
[im 97/129  soft-tissue]
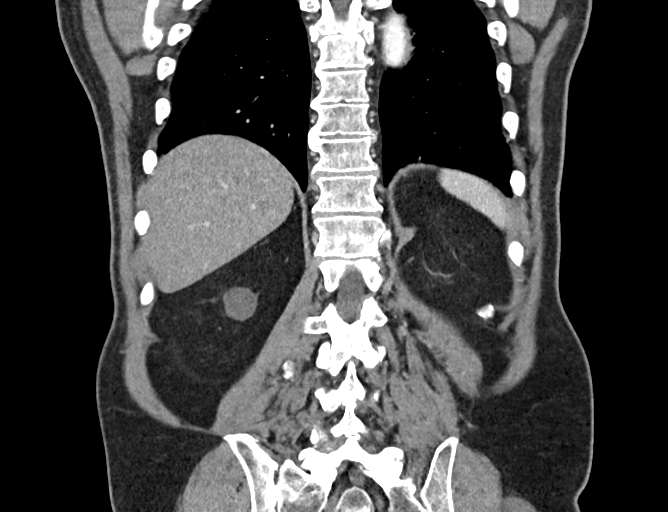

[9 of 46 positions shown; findings below may reference images not displayed]

FINDINGS: Lower Chest: No acute findings.

Hepatobiliary: Mild diffuse hepatic steatosis. A 2.4 cm
hypervascular lesion is seen in segment 7 on image 38/2, which was
not definitely visualized on prior study. No other liver masses
identified. Gallbladder is unremarkable.

Pancreas:  No mass or inflammatory changes.

Spleen: Within normal limits in size and appearance.

Adrenals/Urinary Tract: 3.2 cm fat attenuation mass is seen in the
left adrenal gland, mildly increased in size from 2 cm on 9309 exam.
This is consistent with a benign myelolipoma. The right adrenal
gland is normal in appearance. Several small renal cysts are again
seen bilaterally, however there is no evidence of renal mass or
hydronephrosis.

Stomach/Bowel: No evidence of obstruction, inflammatory process or
abnormal fluid collections.

Vascular/Lymphatic: No pathologically enlarged lymph nodes. No
abdominal aortic aneurysm. Aortic atherosclerosis.

Other:  None.

Musculoskeletal:  No suspicious bone lesions identified.
IMPRESSION: 1. 3.2 cm benign left adrenal myelolipoma, mildly increased in size
compared to 9309 exam.
[DATE] cm indeterminate hypervascular mass in right hepatic lobe,
not definitely visualized on previous study. Recommend abdomen MRI
without and with contrast for further characterization.
3. Mild hepatic steatosis.

Aortic Atherosclerosis ([id number]).

## 2021-01-31 DIAGNOSIS — Z1389 Encounter for screening for other disorder: Secondary | ICD-10-CM | POA: Diagnosis not present

## 2021-01-31 DIAGNOSIS — G609 Hereditary and idiopathic neuropathy, unspecified: Secondary | ICD-10-CM | POA: Diagnosis not present

## 2021-01-31 DIAGNOSIS — G629 Polyneuropathy, unspecified: Secondary | ICD-10-CM | POA: Diagnosis not present

## 2021-01-31 DIAGNOSIS — M5137 Other intervertebral disc degeneration, lumbosacral region: Secondary | ICD-10-CM | POA: Diagnosis not present

## 2021-02-04 DIAGNOSIS — D510 Vitamin B12 deficiency anemia due to intrinsic factor deficiency: Secondary | ICD-10-CM | POA: Diagnosis not present

## 2021-02-04 DIAGNOSIS — R69 Illness, unspecified: Secondary | ICD-10-CM | POA: Diagnosis not present

## 2021-02-04 DIAGNOSIS — Z6829 Body mass index (BMI) 29.0-29.9, adult: Secondary | ICD-10-CM | POA: Diagnosis not present

## 2021-02-04 DIAGNOSIS — E876 Hypokalemia: Secondary | ICD-10-CM | POA: Diagnosis not present

## 2021-02-04 DIAGNOSIS — E7800 Pure hypercholesterolemia, unspecified: Secondary | ICD-10-CM | POA: Diagnosis not present

## 2021-02-04 DIAGNOSIS — Z79899 Other long term (current) drug therapy: Secondary | ICD-10-CM | POA: Diagnosis not present

## 2021-02-04 DIAGNOSIS — I10 Essential (primary) hypertension: Secondary | ICD-10-CM | POA: Diagnosis not present

## 2021-02-14 DIAGNOSIS — L570 Actinic keratosis: Secondary | ICD-10-CM | POA: Diagnosis not present

## 2021-02-14 DIAGNOSIS — Z85828 Personal history of other malignant neoplasm of skin: Secondary | ICD-10-CM | POA: Diagnosis not present

## 2021-02-14 DIAGNOSIS — C4441 Basal cell carcinoma of skin of scalp and neck: Secondary | ICD-10-CM | POA: Diagnosis not present

## 2021-02-14 DIAGNOSIS — L821 Other seborrheic keratosis: Secondary | ICD-10-CM | POA: Diagnosis not present

## 2021-02-14 DIAGNOSIS — D485 Neoplasm of uncertain behavior of skin: Secondary | ICD-10-CM | POA: Diagnosis not present

## 2021-02-14 DIAGNOSIS — L905 Scar conditions and fibrosis of skin: Secondary | ICD-10-CM | POA: Diagnosis not present

## 2021-02-14 DIAGNOSIS — L820 Inflamed seborrheic keratosis: Secondary | ICD-10-CM | POA: Diagnosis not present

## 2021-02-18 ENCOUNTER — Ambulatory Visit (INDEPENDENT_AMBULATORY_CARE_PROVIDER_SITE_OTHER): Payer: Medicare HMO | Admitting: Behavioral Health

## 2021-02-18 ENCOUNTER — Other Ambulatory Visit: Payer: Self-pay

## 2021-02-18 VITALS — BP 175/103 | HR 95 | Ht 73.0 in | Wt 220.0 lb

## 2021-02-18 DIAGNOSIS — F333 Major depressive disorder, recurrent, severe with psychotic symptoms: Secondary | ICD-10-CM | POA: Diagnosis not present

## 2021-02-18 DIAGNOSIS — F411 Generalized anxiety disorder: Secondary | ICD-10-CM | POA: Diagnosis not present

## 2021-02-18 DIAGNOSIS — F331 Major depressive disorder, recurrent, moderate: Secondary | ICD-10-CM

## 2021-02-18 DIAGNOSIS — R69 Illness, unspecified: Secondary | ICD-10-CM | POA: Diagnosis not present

## 2021-02-18 MED ORDER — BUSPIRONE HCL 5 MG PO TABS: 5.0000 mg | ORAL_TABLET | Freq: Three times a day (TID) | ORAL | 0 refills | Status: DC
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
## 2021-02-20 ENCOUNTER — Telehealth: Payer: Self-pay | Admitting: Behavioral Health

## 2021-02-20 ENCOUNTER — Encounter: Payer: Self-pay | Admitting: Behavioral Health

## 2021-02-20 MED ORDER — DULOXETINE HCL 30 MG PO CPEP: ORAL_CAPSULE | ORAL | 0 refills | Status: DC

## 2021-02-20 MED ORDER — AMITRIPTYLINE HCL 25 MG PO TABS: 25.0000 mg | ORAL_TABLET | Freq: Every day | ORAL | 0 refills | Status: DC

## 2021-02-20 MED ORDER — ARIPIPRAZOLE 5 MG PO TABS: 5.0000 mg | ORAL_TABLET | Freq: Every day | ORAL | 0 refills | Status: DC

## 2021-02-20 NOTE — Telephone Encounter (Signed)
I called the patient. Thank you.

## 2021-02-20 NOTE — Telephone Encounter (Signed)
Next visit is 03/18/21. Square's wife, Joel Gross called. She is listed on his DPR to speak to.  Selma  takes 50 mg of Amitriptyline but Joel Gross said he has a bottle for 100 mg at home. Can she split these in half for him to make 50 mg? Her phone number is [phone].

## 2021-02-20 NOTE — Telephone Encounter (Signed)
Please review

## 2021-02-28 ENCOUNTER — Telehealth: Payer: Self-pay

## 2021-02-28 NOTE — Telephone Encounter (Signed)
Prior approval quantity limits received for DULOXETINE DR 30 MG,#90/30 DAY effective 11/03/2020-11/02/2021.  [employer] Medicare ID# [id number]

## 2021-03-18 ENCOUNTER — Other Ambulatory Visit: Payer: Self-pay

## 2021-03-18 ENCOUNTER — Ambulatory Visit (INDEPENDENT_AMBULATORY_CARE_PROVIDER_SITE_OTHER): Payer: Medicare HMO | Admitting: Behavioral Health

## 2021-03-18 ENCOUNTER — Encounter: Payer: Self-pay | Admitting: Behavioral Health

## 2021-03-18 DIAGNOSIS — F331 Major depressive disorder, recurrent, moderate: Secondary | ICD-10-CM | POA: Diagnosis not present

## 2021-03-18 DIAGNOSIS — F411 Generalized anxiety disorder: Secondary | ICD-10-CM

## 2021-03-18 DIAGNOSIS — R69 Illness, unspecified: Secondary | ICD-10-CM | POA: Diagnosis not present

## 2021-03-18 NOTE — Progress Notes (Signed)
[facility] Med Check  Patient ID: Joel Gross,  MRN: [id number]  PCP: [name], MD  Date of Evaluation: 03/18/2021 Time spent:40 minutes  Chief Complaint:  Chief Complaint    Anxiety; Depression; Medication Refill; Follow-up      HISTORY/CURRENT STATUS: HPI  70 year old male presents to this office today for follow up and medication management. His wife is present with consent. He reports a 50% improvement with anxiety, depression, and mental clarity. Reports anxiety today a 3 and depression a 3.  Last visit anxiety was 10 and depression 7. Spouse says mental clarity has improved with "less bizarre behaviors". Pt says the extreme dryness of the mouth and skin has gone away since reducing the amiltriptyline. Says he is sleeping 7- 8 hours of sleep most nights but occasionally is restless. No mania, no auditory or visual hallucinations. No SI/ HI.  Past psychiatric medication failures: Risperidone Flexeril amiltriptyline   Individual Medical History/ Review of Systems: Changes? no  Allergies: Risperidone and related and Hctz [hydrochlorothiazide]  Current Medications:  Current Outpatient Medications:  .  amitriptyline (ELAVIL) 25 MG tablet, Take 1 tablet (25 mg total) by mouth at bedtime., Disp: 30 tablet, Rfl: 0 .  amLODipine (NORVASC) 10 MG tablet, Take 10 mg by mouth daily., Disp: , Rfl:  .  ARIPiprazole (ABILIFY) 5 MG tablet, Take 1 tablet (5 mg total) by mouth daily., Disp: 30 tablet, Rfl: 0 .  busPIRone (BUSPAR) 5 MG tablet, Take 1 tablet (5 mg total) by mouth 3 (three) times daily., Disp: 90 tablet, Rfl: 0 .  DULoxetine (CYMBALTA) 30 MG capsule, One capsule 3 times daily., Disp: 90 capsule, Rfl: 0 .  HYDROcodone-acetaminophen (NORCO) 7.5-325 MG tablet, Take 1 tablet by mouth 3 (three) times daily., Disp: , Rfl:  .  lisinopril (PRINIVIL,ZESTRIL) 40 MG tablet, Take 40 mg by mouth daily., Disp: , Rfl:  .  potassium chloride SA (K-DUR,KLOR-CON) 20 MEQ tablet,  Take 20 mEq by mouth 2 (two) times daily., Disp: , Rfl:  .  pravastatin (PRAVACHOL) 20 MG tablet, Take 40 mg by mouth daily., Disp: , Rfl:  .  pregabalin (LYRICA) 200 MG capsule, Take 1 capsule (200 mg total) by mouth 3 (three) times daily., Disp: 90 capsule, Rfl: 0   Medication Side Effects: amiltriptyline= mild psychosis, dryness  Family Medical/ Social History: Changes?yes  MENTAL HEALTH EXAM:  There were no vitals taken for this visit.There is no height or weight on file to calculate BMI.  General Appearance: Casual and Fairly Groomed  Eye Contact:  Good  Speech:  Clear and Coherent  Volume:  Normal  Mood:  Anxious  Affect:  Appropriate  Thought Process:  Coherent  Orientation:  Full (Time, Place, and Person)  Thought Content: Logical   Suicidal Thoughts:  No  Homicidal Thoughts:  No  Memory:  WNL  Judgement:  Fair  Insight:  Good and Fair  Psychomotor Activity:  NA  Concentration:  Concentration: Fair  Recall:  [employer] of Knowledge: Fair  Language: Good  Assets:  Desire for Improvement Leisure Time Resilience  ADL's:  Intact  Cognition: WNL  Prognosis:  Good    DIAGNOSES: No diagnosis found.  Receiving Psychotherapy: No    RECOMMENDATIONS:  To stop Amiltriptyline 25 mg  Increase Abilify to 10 mg daily Will report any side effects or worsening symptoms To follow up in 2 months to reassess.  Greater than 50% of 40 min. face to face time with patient was spent on  counseling and coordination of care. We discussed potential metabolic side effects associated with atypical antipsychotics, as well as potential risk for movement side effects. There was some significant improvement in the patients mobility and gait since last visit. Advised pt to contact office if movement side effects occur while taking the Abilify. Advised the patient to stop taking the Amiltriptyline tomorrow and advised the patient that this medication was most likely contributing to past cognitive  decline. He had gradually reduced the medication from 100 mg since last visit.  Provided light counseling on family/work life balance and recommended individual/family counseling/pyschotherpay.   Elwanda Brooklyn, NP

## 2021-03-27 DIAGNOSIS — C4441 Basal cell carcinoma of skin of scalp and neck: Secondary | ICD-10-CM | POA: Diagnosis not present

## 2021-04-10 DIAGNOSIS — D510 Vitamin B12 deficiency anemia due to intrinsic factor deficiency: Secondary | ICD-10-CM | POA: Diagnosis not present

## 2021-04-16 ENCOUNTER — Other Ambulatory Visit: Payer: Self-pay | Admitting: Behavioral Health

## 2021-04-16 DIAGNOSIS — F333 Major depressive disorder, recurrent, severe with psychotic symptoms: Secondary | ICD-10-CM

## 2021-04-16 DIAGNOSIS — F411 Generalized anxiety disorder: Secondary | ICD-10-CM

## 2021-04-24 DIAGNOSIS — I10 Essential (primary) hypertension: Secondary | ICD-10-CM | POA: Diagnosis not present

## 2021-04-24 DIAGNOSIS — G8929 Other chronic pain: Secondary | ICD-10-CM | POA: Diagnosis not present

## 2021-04-24 DIAGNOSIS — F411 Generalized anxiety disorder: Secondary | ICD-10-CM | POA: Diagnosis not present

## 2021-04-24 DIAGNOSIS — K5900 Constipation, unspecified: Secondary | ICD-10-CM | POA: Diagnosis not present

## 2021-04-24 DIAGNOSIS — R69 Illness, unspecified: Secondary | ICD-10-CM | POA: Diagnosis not present

## 2021-04-24 DIAGNOSIS — H269 Unspecified cataract: Secondary | ICD-10-CM | POA: Diagnosis not present

## 2021-04-24 DIAGNOSIS — M1990 Unspecified osteoarthritis, unspecified site: Secondary | ICD-10-CM | POA: Diagnosis not present

## 2021-04-24 DIAGNOSIS — G629 Polyneuropathy, unspecified: Secondary | ICD-10-CM | POA: Diagnosis not present

## 2021-04-24 DIAGNOSIS — Z008 Encounter for other general examination: Secondary | ICD-10-CM | POA: Diagnosis not present

## 2021-04-24 DIAGNOSIS — E785 Hyperlipidemia, unspecified: Secondary | ICD-10-CM | POA: Diagnosis not present

## 2021-04-24 DIAGNOSIS — E663 Overweight: Secondary | ICD-10-CM | POA: Diagnosis not present

## 2021-04-25 ENCOUNTER — Other Ambulatory Visit: Payer: Self-pay | Admitting: Behavioral Health

## 2021-04-25 DIAGNOSIS — F331 Major depressive disorder, recurrent, moderate: Secondary | ICD-10-CM

## 2021-05-13 ENCOUNTER — Other Ambulatory Visit: Payer: Self-pay

## 2021-05-13 ENCOUNTER — Ambulatory Visit: Payer: Medicare HMO | Admitting: Behavioral Health

## 2021-05-13 ENCOUNTER — Encounter: Payer: Self-pay | Admitting: Behavioral Health

## 2021-05-13 DIAGNOSIS — R69 Illness, unspecified: Secondary | ICD-10-CM | POA: Diagnosis not present

## 2021-05-13 DIAGNOSIS — F411 Generalized anxiety disorder: Secondary | ICD-10-CM

## 2021-05-13 DIAGNOSIS — F331 Major depressive disorder, recurrent, moderate: Secondary | ICD-10-CM | POA: Diagnosis not present

## 2021-05-13 MED ORDER — DULOXETINE HCL 30 MG PO CPEP: ORAL_CAPSULE | ORAL | 0 refills | Status: DC

## 2021-05-13 MED ORDER — ARIPIPRAZOLE 5 MG PO TABS: 5.0000 mg | ORAL_TABLET | Freq: Every day | ORAL | 1 refills | Status: DC

## 2021-05-13 NOTE — Progress Notes (Signed)
[facility]  Patient ID: Joel Gross,  MRN: [id number]  PCP: [name], MD  Date of Evaluation: 05/13/2021 Time spent:20 minutes  Chief Complaint:  Chief Complaint   Anxiety; Depression; Medication Refill; Follow-up     HISTORY/CURRENT STATUS: HPI 70 year old male presents to this office for follow up and medication management. He is alert and gait has significantly improved. He says that he feels "pretty good". Says that his wife did move out of house and across the street. Says they have had continued marital disagreements. Says that he feels much better and is not as foggy since coming off Flexeril, stopping Amitriptyline, and reducing Cymbalta.  Says his depression today is 2/10 and anxiety 2/10. Reports sleeping 7-8 hours per night.  Did admit to some experimentation with Delta 8 and gummies containing THC. He does not want a medication adjustment at this time. No mania or psychosis. No SI/HI.  Past psychiatric medication failures: Risperidone Flexeril amiltriptyline   Individual Medical History/ Review of Systems: Changes? :No   Allergies: Risperidone and related and Hctz [hydrochlorothiazide]  Current Medications:  Current Outpatient Medications:    amLODipine (NORVASC) 10 MG tablet, Take 10 mg by mouth daily., Disp: , Rfl:    busPIRone (BUSPAR) 5 MG tablet, Take 1 tablet (5 mg total) by mouth 3 (three) times daily., Disp: 90 tablet, Rfl: 0   lisinopril (PRINIVIL,ZESTRIL) 40 MG tablet, Take 40 mg by mouth daily., Disp: , Rfl:    potassium chloride SA (K-DUR,KLOR-CON) 20 MEQ tablet, Take 20 mEq by mouth 2 (two) times daily., Disp: , Rfl:    pravastatin (PRAVACHOL) 20 MG tablet, Take 40 mg by mouth daily., Disp: , Rfl:    pregabalin (LYRICA) 200 MG capsule, Take 1 capsule (200 mg total) by mouth 3 (three) times daily., Disp: 90 capsule, Rfl: 0   ARIPiprazole (ABILIFY) 5 MG tablet, Take 1 tablet (5 mg total) by mouth daily., Disp: 90 tablet, Rfl: 1    DULoxetine (CYMBALTA) 30 MG capsule, TAKE 1 CAPSULE BY MOUTH THREE TIMES DAILY, Disp: 270 capsule, Rfl: 0   HYDROcodone-acetaminophen (NORCO) 7.5-325 MG tablet, Take 1 tablet by mouth 3 (three) times daily. (Patient not taking: Reported on 05/13/2021), Disp: , Rfl:  Medication Side Effects: none  Family Medical/ Social History: Changes? No  MENTAL HEALTH EXAM:  There were no vitals taken for this visit.There is no height or weight on file to calculate BMI.  General Appearance: Casual and Neat  Eye Contact:  Good  Speech:  Clear and Coherent  Volume:  Normal  Mood:  NA  Affect:  Appropriate  Thought Process:  Coherent  Orientation:  Full (Time, Place, and Person)  Thought Content: Logical   Suicidal Thoughts:  No  Homicidal Thoughts:  No  Memory:  WNL  Judgement:  Good  Insight:  Good  Psychomotor Activity:  Normal  Concentration:  Concentration: Good  Recall:  Good  Fund of Knowledge: Good  Language: Good  Assets:  Desire for Improvement  ADL's:  Intact  Cognition: WNL  Prognosis:  Fair    DIAGNOSES:    ICD-10-CM   1. Generalized anxiety disorder  F41.1 ARIPiprazole (ABILIFY) 5 MG tablet    2. Major depressive disorder, recurrent episode, moderate (HCC)  F33.1 DULoxetine (CYMBALTA) 30 MG capsule      Receiving Psychotherapy: No    RECOMMENDATIONS:  Patient did not increase Abilify as directed but reports improved symptoms on 5 mg. Will remain on Abilify 5 mg.  Continue Cymbalta 30 mg three times daily Will report any side effects or worsening symptoms To follow up in 3 months to reassess.   Greater than 50% of 40 min. face to face time with patient was spent on counseling and coordination of care. We discussed potential metabolic side effects associated with atypical antipsychotics, as well as potential risk for movement side effects. There was some significant improvement in the patients mobility and gait since last visit. Discussed apparent improvement in  cognition and energy levels since stopping Amitriptyline. Pt has not experienced anymore delusions or visual hallucinations.  Counseled patient on use of THC and Delta 8 possible inducing or triggering underlying psychosis. Refills escribed to pharmacy.    Elwanda Brooklyn, NP

## 2021-06-18 DIAGNOSIS — R972 Elevated prostate specific antigen [PSA]: Secondary | ICD-10-CM | POA: Diagnosis not present

## 2021-06-20 DIAGNOSIS — H2511 Age-related nuclear cataract, right eye: Secondary | ICD-10-CM | POA: Diagnosis not present

## 2021-06-20 DIAGNOSIS — H2512 Age-related nuclear cataract, left eye: Secondary | ICD-10-CM | POA: Diagnosis not present

## 2021-06-20 DIAGNOSIS — H35372 Puckering of macula, left eye: Secondary | ICD-10-CM | POA: Diagnosis not present

## 2021-06-26 DIAGNOSIS — Z01818 Encounter for other preprocedural examination: Secondary | ICD-10-CM | POA: Diagnosis not present

## 2021-06-26 DIAGNOSIS — R351 Nocturia: Secondary | ICD-10-CM | POA: Diagnosis not present

## 2021-06-26 DIAGNOSIS — R3915 Urgency of urination: Secondary | ICD-10-CM | POA: Diagnosis not present

## 2021-06-26 DIAGNOSIS — R972 Elevated prostate specific antigen [PSA]: Secondary | ICD-10-CM | POA: Diagnosis not present

## 2021-06-26 DIAGNOSIS — H2511 Age-related nuclear cataract, right eye: Secondary | ICD-10-CM | POA: Diagnosis not present

## 2021-06-26 DIAGNOSIS — N401 Enlarged prostate with lower urinary tract symptoms: Secondary | ICD-10-CM | POA: Diagnosis not present

## 2021-06-27 ENCOUNTER — Other Ambulatory Visit: Payer: Self-pay | Admitting: Urology

## 2021-06-27 DIAGNOSIS — R972 Elevated prostate specific antigen [PSA]: Secondary | ICD-10-CM

## 2021-06-28 ENCOUNTER — Ambulatory Visit: Payer: Medicare HMO | Admitting: Neurology

## 2021-07-18 DIAGNOSIS — H2511 Age-related nuclear cataract, right eye: Secondary | ICD-10-CM | POA: Diagnosis not present

## 2021-08-01 DIAGNOSIS — H25812 Combined forms of age-related cataract, left eye: Secondary | ICD-10-CM | POA: Diagnosis not present

## 2021-08-01 DIAGNOSIS — H2512 Age-related nuclear cataract, left eye: Secondary | ICD-10-CM | POA: Diagnosis not present

## 2021-08-13 ENCOUNTER — Ambulatory Visit: Payer: Medicare HMO | Admitting: Behavioral Health

## 2021-08-15 DIAGNOSIS — D510 Vitamin B12 deficiency anemia due to intrinsic factor deficiency: Secondary | ICD-10-CM | POA: Diagnosis not present

## 2021-08-27 ENCOUNTER — Ambulatory Visit: Payer: Medicare HMO | Admitting: Behavioral Health

## 2021-08-27 ENCOUNTER — Encounter: Payer: Self-pay | Admitting: Behavioral Health

## 2021-08-27 ENCOUNTER — Other Ambulatory Visit: Payer: Self-pay

## 2021-08-27 DIAGNOSIS — F411 Generalized anxiety disorder: Secondary | ICD-10-CM

## 2021-08-27 DIAGNOSIS — F331 Major depressive disorder, recurrent, moderate: Secondary | ICD-10-CM

## 2021-08-27 DIAGNOSIS — R69 Illness, unspecified: Secondary | ICD-10-CM | POA: Diagnosis not present

## 2021-08-27 MED ORDER — BUSPIRONE HCL 15 MG PO TABS: ORAL_TABLET | ORAL | 1 refills | Status: DC

## 2021-08-27 MED ORDER — DULOXETINE HCL 30 MG PO CPEP: ORAL_CAPSULE | ORAL | 2 refills | Status: DC

## 2021-08-27 NOTE — Progress Notes (Signed)
[facility]  Patient ID: JAME MORRELL,  MRN: [id number]  PCP: [name], MD  Date of Evaluation: 08/27/2021 Time spent:30 minutes  Chief Complaint:  Chief Complaint   Anxiety; Depression; Follow-up; Medication Refill; Family Problem     HISTORY/CURRENT STATUS: HPI  70 year old male presents to this office for follow up and medication management. He is alert and gait has significantly improved. He says that he has had some increased anxiety and maybe some depression but is not sure. Says that his wife is still out of the house and living across the street with daughter.  Says they have had continued marital disagreements but do go to church together and occasionally go out to dinner.  Thinks his neuropathy pain is increasing the anxiety.  Wants to follow up with his pain management doctor. Says his depression today is 4/10 and anxiety 5/10. Reports sleeping 7-8 hours per night.  Did admit to some experimentation with Delta 8 and gummies containing THC. He does not want a medication adjustment at this time. No mania or psychosis. No SI/HI.   Past psychiatric medication failures: Risperidone Flexeril amiltriptyline     Individual Medical History/ Review of Systems: Changes? :No   Allergies: Risperidone and related and Hctz [hydrochlorothiazide]  Current Medications:  Current Outpatient Medications:    amLODipine (NORVASC) 10 MG tablet, Take 10 mg by mouth daily., Disp: , Rfl:    ARIPiprazole (ABILIFY) 5 MG tablet, Take 1 tablet (5 mg total) by mouth daily., Disp: 90 tablet, Rfl: 1   busPIRone (BUSPAR) 15 MG tablet, Take 1/3 tablet p.o. twice daily for 1 week, then take 2/3 tablet p.o. twice daily for 1 week, then take 1 tablet p.o. twice daily, Disp: 60 tablet, Rfl: 1   lisinopril (PRINIVIL,ZESTRIL) 40 MG tablet, Take 40 mg by mouth daily., Disp: , Rfl:    potassium chloride SA (K-DUR,KLOR-CON) 20 MEQ tablet, Take 20 mEq by mouth 2 (two) times daily., Disp: ,  Rfl:    pravastatin (PRAVACHOL) 20 MG tablet, Take 40 mg by mouth daily., Disp: , Rfl:    pregabalin (LYRICA) 200 MG capsule, Take 1 capsule (200 mg total) by mouth 3 (three) times daily., Disp: 90 capsule, Rfl: 0   busPIRone (BUSPAR) 5 MG tablet, Take 1 tablet (5 mg total) by mouth 3 (three) times daily. (Patient not taking: Reported on 08/27/2021), Disp: 90 tablet, Rfl: 0   DULoxetine (CYMBALTA) 30 MG capsule, TAKE 1 CAPSULE BY MOUTH THREE TIMES DAILY, Disp: 270 capsule, Rfl: 2   HYDROcodone-acetaminophen (NORCO) 7.5-325 MG tablet, Take 1 tablet by mouth 3 (three) times daily. (Patient not taking: Reported on 05/13/2021), Disp: , Rfl:    ofloxacin (OCUFLOX) 0.3 % ophthalmic solution, , Disp: , Rfl:    prednisoLONE acetate (PRED FORTE) 1 % ophthalmic suspension, Place into the left eye., Disp: , Rfl:    rosuvastatin (CRESTOR) 5 MG tablet, Take 5 mg by mouth at bedtime as needed., Disp: , Rfl:    silodosin (RAPAFLO) 8 MG CAPS capsule, Take 1 capsule by mouth at bedtime. (Patient not taking: Reported on 08/27/2021), Disp: , Rfl:    tamsulosin (FLOMAX) 0.4 MG CAPS capsule, Take 0.4 mg by mouth daily., Disp: , Rfl:  Medication Side Effects: none  Family Medical/ Social History: Changes? No  MENTAL HEALTH EXAM:  There were no vitals taken for this visit.There is no height or weight on file to calculate BMI.  General Appearance: Casual, Neat, and Well Groomed  Eye Contact:  Good  Speech:  Clear and Coherent  Volume:  Normal  Mood:  Anxious and Depressed  Affect:  Appropriate, Depressed, and Anxious  Thought Process:  Coherent  Orientation:  Full (Time, Place, and Person)  Thought Content: Logical   Suicidal Thoughts:  No  Homicidal Thoughts:  No  Memory:  WNL  Judgement:  Good  Insight:  Good  Psychomotor Activity:  Normal  Concentration:  Concentration: Good  Recall:  Good  Fund of Knowledge: Good  Language: Good  Assets:  Desire for Improvement  ADL's:  Intact  Cognition: WNL   Prognosis:  Good    DIAGNOSES:    ICD-10-CM   1. Generalized anxiety disorder  F41.1 busPIRone (BUSPAR) 15 MG tablet    2. Major depressive disorder, recurrent episode, moderate (HCC)  F33.1 busPIRone (BUSPAR) 15 MG tablet    DULoxetine (CYMBALTA) 30 MG capsule      Receiving Psychotherapy: No    RECOMMENDATIONS:   Patient did not increase Abilify as directed but reports improved symptoms on 5 mg. Will remain on Abilify 5 mg.  Continue Cymbalta 30 mg three times daily Discussed potential benefits, risks, and side effects of BuSpar.  Patient agrees to trial of BuSpar.  Will start BuSpar 15 mg 1/3 tablet twice daily for 1 week, then increase to 2/3 tablet twice daily for 1 week, then increase to 1 tablet twice daily for anxiety.  Will report any side effects or worsening symptoms To follow up in 4 weeks to reassess. Provided emergency contact information .   Greater than 50% of 40 min. face to face time with patient was spent on counseling and coordination of care. We discussed potential metabolic side effects associated with atypical antipsychotics, as well as potential risk for movement side effects. There was some significant change in patients gait. 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 Discussed apparent improvement in cognition and energy levels since stopping Amitriptyline. Explained that pain can affect his moods and increase anxiety and depression levels. He agrees to follow up with pain management. Pt has not experienced anymore delusions or visual hallucinations.  Counseled patient on use of THC and Delta 8 possible inducing or triggering underlying psychosis. Refills escribed to pharmacy.         Elwanda Brooklyn, NP

## 2021-09-17 DIAGNOSIS — Z1389 Encounter for screening for other disorder: Secondary | ICD-10-CM | POA: Diagnosis not present

## 2021-09-17 DIAGNOSIS — Z79891 Long term (current) use of opiate analgesic: Secondary | ICD-10-CM | POA: Diagnosis not present

## 2021-09-17 DIAGNOSIS — G609 Hereditary and idiopathic neuropathy, unspecified: Secondary | ICD-10-CM | POA: Diagnosis not present

## 2021-09-17 DIAGNOSIS — G629 Polyneuropathy, unspecified: Secondary | ICD-10-CM | POA: Diagnosis not present

## 2021-09-17 DIAGNOSIS — G894 Chronic pain syndrome: Secondary | ICD-10-CM | POA: Diagnosis not present

## 2021-09-17 DIAGNOSIS — M5137 Other intervertebral disc degeneration, lumbosacral region: Secondary | ICD-10-CM | POA: Diagnosis not present

## 2021-09-24 ENCOUNTER — Ambulatory Visit: Payer: Medicare HMO | Admitting: Behavioral Health

## 2021-10-04 DIAGNOSIS — Z0000 Encounter for general adult medical examination without abnormal findings: Secondary | ICD-10-CM | POA: Diagnosis not present

## 2021-10-04 DIAGNOSIS — Z6829 Body mass index (BMI) 29.0-29.9, adult: Secondary | ICD-10-CM | POA: Diagnosis not present

## 2021-10-04 DIAGNOSIS — Z23 Encounter for immunization: Secondary | ICD-10-CM | POA: Diagnosis not present

## 2021-10-04 DIAGNOSIS — Z1331 Encounter for screening for depression: Secondary | ICD-10-CM | POA: Diagnosis not present

## 2021-10-10 DIAGNOSIS — G629 Polyneuropathy, unspecified: Secondary | ICD-10-CM | POA: Diagnosis not present

## 2021-10-10 DIAGNOSIS — M5137 Other intervertebral disc degeneration, lumbosacral region: Secondary | ICD-10-CM | POA: Diagnosis not present

## 2021-10-10 DIAGNOSIS — Z79891 Long term (current) use of opiate analgesic: Secondary | ICD-10-CM | POA: Diagnosis not present

## 2021-10-10 DIAGNOSIS — G609 Hereditary and idiopathic neuropathy, unspecified: Secondary | ICD-10-CM | POA: Diagnosis not present

## 2021-10-10 DIAGNOSIS — Z1389 Encounter for screening for other disorder: Secondary | ICD-10-CM | POA: Diagnosis not present

## 2021-10-29 ENCOUNTER — Other Ambulatory Visit: Payer: Self-pay | Admitting: Urology

## 2021-10-29 DIAGNOSIS — R972 Elevated prostate specific antigen [PSA]: Secondary | ICD-10-CM

## 2021-11-05 DIAGNOSIS — R634 Abnormal weight loss: Secondary | ICD-10-CM | POA: Diagnosis not present

## 2021-11-05 DIAGNOSIS — R1032 Left lower quadrant pain: Secondary | ICD-10-CM | POA: Diagnosis not present

## 2021-11-05 DIAGNOSIS — K5903 Drug induced constipation: Secondary | ICD-10-CM | POA: Diagnosis not present

## 2021-11-05 DIAGNOSIS — T402X5A Adverse effect of other opioids, initial encounter: Secondary | ICD-10-CM | POA: Diagnosis not present

## 2021-11-14 DIAGNOSIS — G629 Polyneuropathy, unspecified: Secondary | ICD-10-CM | POA: Diagnosis not present

## 2021-11-14 DIAGNOSIS — G609 Hereditary and idiopathic neuropathy, unspecified: Secondary | ICD-10-CM | POA: Diagnosis not present

## 2021-11-14 DIAGNOSIS — Z79891 Long term (current) use of opiate analgesic: Secondary | ICD-10-CM | POA: Diagnosis not present

## 2021-11-14 DIAGNOSIS — M5137 Other intervertebral disc degeneration, lumbosacral region: Secondary | ICD-10-CM | POA: Diagnosis not present

## 2021-11-14 DIAGNOSIS — Z1389 Encounter for screening for other disorder: Secondary | ICD-10-CM | POA: Diagnosis not present

## 2021-11-18 DIAGNOSIS — R110 Nausea: Secondary | ICD-10-CM | POA: Diagnosis not present

## 2021-11-18 DIAGNOSIS — R1032 Left lower quadrant pain: Secondary | ICD-10-CM | POA: Diagnosis not present

## 2021-11-18 DIAGNOSIS — N281 Cyst of kidney, acquired: Secondary | ICD-10-CM | POA: Diagnosis not present

## 2021-11-18 DIAGNOSIS — D3502 Benign neoplasm of left adrenal gland: Secondary | ICD-10-CM | POA: Diagnosis not present

## 2021-11-18 DIAGNOSIS — K4090 Unilateral inguinal hernia, without obstruction or gangrene, not specified as recurrent: Secondary | ICD-10-CM | POA: Diagnosis not present

## 2021-11-18 DIAGNOSIS — E278 Other specified disorders of adrenal gland: Secondary | ICD-10-CM | POA: Diagnosis not present

## 2021-11-29 DIAGNOSIS — E876 Hypokalemia: Secondary | ICD-10-CM | POA: Diagnosis not present

## 2021-11-29 DIAGNOSIS — G8929 Other chronic pain: Secondary | ICD-10-CM | POA: Diagnosis not present

## 2021-11-29 DIAGNOSIS — F329 Major depressive disorder, single episode, unspecified: Secondary | ICD-10-CM | POA: Diagnosis not present

## 2021-11-29 DIAGNOSIS — R69 Illness, unspecified: Secondary | ICD-10-CM | POA: Diagnosis not present

## 2021-11-29 DIAGNOSIS — Z20822 Contact with and (suspected) exposure to covid-19: Secondary | ICD-10-CM | POA: Diagnosis not present

## 2021-11-29 DIAGNOSIS — K5900 Constipation, unspecified: Secondary | ICD-10-CM | POA: Diagnosis not present

## 2021-11-29 DIAGNOSIS — I10 Essential (primary) hypertension: Secondary | ICD-10-CM | POA: Diagnosis not present

## 2021-11-29 DIAGNOSIS — F411 Generalized anxiety disorder: Secondary | ICD-10-CM | POA: Diagnosis not present

## 2021-11-29 DIAGNOSIS — R45851 Suicidal ideations: Secondary | ICD-10-CM | POA: Diagnosis not present

## 2021-11-29 DIAGNOSIS — F32A Depression, unspecified: Secondary | ICD-10-CM | POA: Diagnosis not present

## 2021-11-29 DIAGNOSIS — N400 Enlarged prostate without lower urinary tract symptoms: Secondary | ICD-10-CM | POA: Diagnosis not present

## 2021-11-30 DIAGNOSIS — R45851 Suicidal ideations: Secondary | ICD-10-CM | POA: Diagnosis not present

## 2021-11-30 DIAGNOSIS — Z630 Problems in relationship with spouse or partner: Secondary | ICD-10-CM | POA: Diagnosis not present

## 2021-11-30 DIAGNOSIS — F411 Generalized anxiety disorder: Secondary | ICD-10-CM | POA: Diagnosis not present

## 2021-11-30 DIAGNOSIS — R69 Illness, unspecified: Secondary | ICD-10-CM | POA: Diagnosis not present

## 2021-11-30 DIAGNOSIS — F332 Major depressive disorder, recurrent severe without psychotic features: Secondary | ICD-10-CM | POA: Diagnosis not present

## 2021-11-30 DIAGNOSIS — G8929 Other chronic pain: Secondary | ICD-10-CM | POA: Diagnosis not present

## 2021-11-30 DIAGNOSIS — T391X2A Poisoning by 4-Aminophenol derivatives, intentional self-harm, initial encounter: Secondary | ICD-10-CM | POA: Diagnosis not present

## 2021-11-30 DIAGNOSIS — K5900 Constipation, unspecified: Secondary | ICD-10-CM | POA: Diagnosis not present

## 2021-11-30 DIAGNOSIS — F32A Depression, unspecified: Secondary | ICD-10-CM | POA: Diagnosis not present

## 2021-11-30 DIAGNOSIS — Z20822 Contact with and (suspected) exposure to covid-19: Secondary | ICD-10-CM | POA: Diagnosis not present

## 2021-11-30 DIAGNOSIS — I10 Essential (primary) hypertension: Secondary | ICD-10-CM | POA: Diagnosis not present

## 2021-11-30 DIAGNOSIS — F329 Major depressive disorder, single episode, unspecified: Secondary | ICD-10-CM | POA: Diagnosis not present

## 2021-11-30 DIAGNOSIS — F339 Major depressive disorder, recurrent, unspecified: Secondary | ICD-10-CM | POA: Diagnosis not present

## 2021-11-30 DIAGNOSIS — Z888 Allergy status to other drugs, medicaments and biological substances status: Secondary | ICD-10-CM | POA: Diagnosis not present

## 2021-11-30 DIAGNOSIS — G4700 Insomnia, unspecified: Secondary | ICD-10-CM | POA: Diagnosis not present

## 2021-11-30 DIAGNOSIS — Z9151 Personal history of suicidal behavior: Secondary | ICD-10-CM | POA: Diagnosis not present

## 2021-11-30 DIAGNOSIS — Z79899 Other long term (current) drug therapy: Secondary | ICD-10-CM | POA: Diagnosis not present

## 2021-11-30 DIAGNOSIS — E876 Hypokalemia: Secondary | ICD-10-CM | POA: Diagnosis not present

## 2021-11-30 DIAGNOSIS — Z635 Disruption of family by separation and divorce: Secondary | ICD-10-CM | POA: Diagnosis not present

## 2021-11-30 DIAGNOSIS — N400 Enlarged prostate without lower urinary tract symptoms: Secondary | ICD-10-CM | POA: Diagnosis not present

## 2021-11-30 DIAGNOSIS — Z818 Family history of other mental and behavioral disorders: Secondary | ICD-10-CM | POA: Diagnosis not present

## 2021-12-01 DIAGNOSIS — R45851 Suicidal ideations: Secondary | ICD-10-CM | POA: Diagnosis not present

## 2021-12-01 DIAGNOSIS — R69 Illness, unspecified: Secondary | ICD-10-CM | POA: Diagnosis not present

## 2021-12-12 DIAGNOSIS — Z6829 Body mass index (BMI) 29.0-29.9, adult: Secondary | ICD-10-CM | POA: Diagnosis not present

## 2021-12-12 DIAGNOSIS — R69 Illness, unspecified: Secondary | ICD-10-CM | POA: Diagnosis not present

## 2021-12-12 DIAGNOSIS — I10 Essential (primary) hypertension: Secondary | ICD-10-CM | POA: Diagnosis not present

## 2021-12-18 DIAGNOSIS — R972 Elevated prostate specific antigen [PSA]: Secondary | ICD-10-CM | POA: Diagnosis not present

## 2021-12-18 DIAGNOSIS — R69 Illness, unspecified: Secondary | ICD-10-CM | POA: Diagnosis not present

## 2021-12-18 DIAGNOSIS — F4310 Post-traumatic stress disorder, unspecified: Secondary | ICD-10-CM | POA: Diagnosis not present

## 2021-12-19 DIAGNOSIS — Z6829 Body mass index (BMI) 29.0-29.9, adult: Secondary | ICD-10-CM | POA: Diagnosis not present

## 2021-12-19 DIAGNOSIS — L309 Dermatitis, unspecified: Secondary | ICD-10-CM | POA: Diagnosis not present

## 2021-12-19 DIAGNOSIS — R69 Illness, unspecified: Secondary | ICD-10-CM | POA: Diagnosis not present

## 2021-12-24 DIAGNOSIS — G629 Polyneuropathy, unspecified: Secondary | ICD-10-CM | POA: Diagnosis not present

## 2021-12-24 DIAGNOSIS — Z79891 Long term (current) use of opiate analgesic: Secondary | ICD-10-CM | POA: Diagnosis not present

## 2021-12-24 DIAGNOSIS — M5137 Other intervertebral disc degeneration, lumbosacral region: Secondary | ICD-10-CM | POA: Diagnosis not present

## 2021-12-24 DIAGNOSIS — R69 Illness, unspecified: Secondary | ICD-10-CM | POA: Diagnosis not present

## 2021-12-24 DIAGNOSIS — G609 Hereditary and idiopathic neuropathy, unspecified: Secondary | ICD-10-CM | POA: Diagnosis not present

## 2021-12-24 DIAGNOSIS — Z1389 Encounter for screening for other disorder: Secondary | ICD-10-CM | POA: Diagnosis not present

## 2021-12-24 DIAGNOSIS — F332 Major depressive disorder, recurrent severe without psychotic features: Secondary | ICD-10-CM | POA: Diagnosis not present

## 2021-12-25 DIAGNOSIS — N401 Enlarged prostate with lower urinary tract symptoms: Secondary | ICD-10-CM | POA: Diagnosis not present

## 2021-12-25 DIAGNOSIS — R972 Elevated prostate specific antigen [PSA]: Secondary | ICD-10-CM | POA: Diagnosis not present

## 2021-12-25 DIAGNOSIS — F4310 Post-traumatic stress disorder, unspecified: Secondary | ICD-10-CM | POA: Diagnosis not present

## 2021-12-25 DIAGNOSIS — R3915 Urgency of urination: Secondary | ICD-10-CM | POA: Diagnosis not present

## 2021-12-25 DIAGNOSIS — R351 Nocturia: Secondary | ICD-10-CM | POA: Diagnosis not present

## 2021-12-25 DIAGNOSIS — R69 Illness, unspecified: Secondary | ICD-10-CM | POA: Diagnosis not present

## 2021-12-31 DIAGNOSIS — R972 Elevated prostate specific antigen [PSA]: Secondary | ICD-10-CM | POA: Diagnosis not present

## 2022-01-01 DIAGNOSIS — F4310 Post-traumatic stress disorder, unspecified: Secondary | ICD-10-CM | POA: Diagnosis not present

## 2022-01-01 DIAGNOSIS — R69 Illness, unspecified: Secondary | ICD-10-CM | POA: Diagnosis not present

## 2022-01-21 DIAGNOSIS — Z79891 Long term (current) use of opiate analgesic: Secondary | ICD-10-CM | POA: Diagnosis not present

## 2022-01-21 DIAGNOSIS — M5137 Other intervertebral disc degeneration, lumbosacral region: Secondary | ICD-10-CM | POA: Diagnosis not present

## 2022-01-21 DIAGNOSIS — G609 Hereditary and idiopathic neuropathy, unspecified: Secondary | ICD-10-CM | POA: Diagnosis not present

## 2022-01-21 DIAGNOSIS — G629 Polyneuropathy, unspecified: Secondary | ICD-10-CM | POA: Diagnosis not present

## 2022-01-21 DIAGNOSIS — Z1389 Encounter for screening for other disorder: Secondary | ICD-10-CM | POA: Diagnosis not present

## 2022-01-24 DIAGNOSIS — F4323 Adjustment disorder with mixed anxiety and depressed mood: Secondary | ICD-10-CM | POA: Diagnosis not present

## 2022-01-24 DIAGNOSIS — R69 Illness, unspecified: Secondary | ICD-10-CM | POA: Diagnosis not present

## 2022-02-20 DIAGNOSIS — Z79891 Long term (current) use of opiate analgesic: Secondary | ICD-10-CM | POA: Diagnosis not present

## 2022-02-20 DIAGNOSIS — G629 Polyneuropathy, unspecified: Secondary | ICD-10-CM | POA: Diagnosis not present

## 2022-02-20 DIAGNOSIS — Z1389 Encounter for screening for other disorder: Secondary | ICD-10-CM | POA: Diagnosis not present

## 2022-02-20 DIAGNOSIS — M5137 Other intervertebral disc degeneration, lumbosacral region: Secondary | ICD-10-CM | POA: Diagnosis not present

## 2022-02-20 DIAGNOSIS — G609 Hereditary and idiopathic neuropathy, unspecified: Secondary | ICD-10-CM | POA: Diagnosis not present

## 2022-02-28 DIAGNOSIS — F332 Major depressive disorder, recurrent severe without psychotic features: Secondary | ICD-10-CM | POA: Diagnosis not present

## 2022-02-28 DIAGNOSIS — R69 Illness, unspecified: Secondary | ICD-10-CM | POA: Diagnosis not present

## 2022-03-10 DIAGNOSIS — E669 Obesity, unspecified: Secondary | ICD-10-CM | POA: Diagnosis not present

## 2022-03-10 DIAGNOSIS — R69 Illness, unspecified: Secondary | ICD-10-CM | POA: Diagnosis not present

## 2022-03-10 DIAGNOSIS — M1990 Unspecified osteoarthritis, unspecified site: Secondary | ICD-10-CM | POA: Diagnosis not present

## 2022-03-10 DIAGNOSIS — G4700 Insomnia, unspecified: Secondary | ICD-10-CM | POA: Diagnosis not present

## 2022-03-10 DIAGNOSIS — Z008 Encounter for other general examination: Secondary | ICD-10-CM | POA: Diagnosis not present

## 2022-03-10 DIAGNOSIS — I10 Essential (primary) hypertension: Secondary | ICD-10-CM | POA: Diagnosis not present

## 2022-03-10 DIAGNOSIS — E785 Hyperlipidemia, unspecified: Secondary | ICD-10-CM | POA: Diagnosis not present

## 2022-03-10 DIAGNOSIS — K219 Gastro-esophageal reflux disease without esophagitis: Secondary | ICD-10-CM | POA: Diagnosis not present

## 2022-03-10 DIAGNOSIS — N401 Enlarged prostate with lower urinary tract symptoms: Secondary | ICD-10-CM | POA: Diagnosis not present

## 2022-03-10 DIAGNOSIS — N39498 Other specified urinary incontinence: Secondary | ICD-10-CM | POA: Diagnosis not present

## 2022-03-10 DIAGNOSIS — G603 Idiopathic progressive neuropathy: Secondary | ICD-10-CM | POA: Diagnosis not present

## 2022-03-25 DIAGNOSIS — G629 Polyneuropathy, unspecified: Secondary | ICD-10-CM | POA: Diagnosis not present

## 2022-03-25 DIAGNOSIS — G609 Hereditary and idiopathic neuropathy, unspecified: Secondary | ICD-10-CM | POA: Diagnosis not present

## 2022-03-25 DIAGNOSIS — M5137 Other intervertebral disc degeneration, lumbosacral region: Secondary | ICD-10-CM | POA: Diagnosis not present

## 2022-03-25 DIAGNOSIS — Z1389 Encounter for screening for other disorder: Secondary | ICD-10-CM | POA: Diagnosis not present

## 2022-03-25 DIAGNOSIS — Z79891 Long term (current) use of opiate analgesic: Secondary | ICD-10-CM | POA: Diagnosis not present

## 2022-03-28 DIAGNOSIS — F332 Major depressive disorder, recurrent severe without psychotic features: Secondary | ICD-10-CM | POA: Diagnosis not present

## 2022-03-28 DIAGNOSIS — R69 Illness, unspecified: Secondary | ICD-10-CM | POA: Diagnosis not present

## 2022-04-09 DIAGNOSIS — R972 Elevated prostate specific antigen [PSA]: Secondary | ICD-10-CM | POA: Diagnosis not present

## 2022-04-09 DIAGNOSIS — R351 Nocturia: Secondary | ICD-10-CM | POA: Diagnosis not present

## 2022-04-09 DIAGNOSIS — N401 Enlarged prostate with lower urinary tract symptoms: Secondary | ICD-10-CM | POA: Diagnosis not present

## 2022-04-23 DIAGNOSIS — Z1389 Encounter for screening for other disorder: Secondary | ICD-10-CM | POA: Diagnosis not present

## 2022-04-23 DIAGNOSIS — Z79891 Long term (current) use of opiate analgesic: Secondary | ICD-10-CM | POA: Diagnosis not present

## 2022-04-23 DIAGNOSIS — G894 Chronic pain syndrome: Secondary | ICD-10-CM | POA: Diagnosis not present

## 2022-04-23 DIAGNOSIS — G609 Hereditary and idiopathic neuropathy, unspecified: Secondary | ICD-10-CM | POA: Diagnosis not present

## 2022-04-23 DIAGNOSIS — G629 Polyneuropathy, unspecified: Secondary | ICD-10-CM | POA: Diagnosis not present

## 2022-04-23 DIAGNOSIS — M5137 Other intervertebral disc degeneration, lumbosacral region: Secondary | ICD-10-CM | POA: Diagnosis not present

## 2022-05-07 DIAGNOSIS — Z6829 Body mass index (BMI) 29.0-29.9, adult: Secondary | ICD-10-CM | POA: Diagnosis not present

## 2022-05-07 DIAGNOSIS — R609 Edema, unspecified: Secondary | ICD-10-CM | POA: Diagnosis not present

## 2022-05-07 DIAGNOSIS — I10 Essential (primary) hypertension: Secondary | ICD-10-CM | POA: Diagnosis not present

## 2022-05-15 DIAGNOSIS — E785 Hyperlipidemia, unspecified: Secondary | ICD-10-CM | POA: Diagnosis not present

## 2022-05-15 DIAGNOSIS — R112 Nausea with vomiting, unspecified: Secondary | ICD-10-CM | POA: Diagnosis not present

## 2022-05-15 DIAGNOSIS — E873 Alkalosis: Secondary | ICD-10-CM | POA: Diagnosis not present

## 2022-05-15 DIAGNOSIS — Z79891 Long term (current) use of opiate analgesic: Secondary | ICD-10-CM | POA: Diagnosis not present

## 2022-05-15 DIAGNOSIS — I10 Essential (primary) hypertension: Secondary | ICD-10-CM | POA: Diagnosis not present

## 2022-05-15 DIAGNOSIS — E876 Hypokalemia: Secondary | ICD-10-CM | POA: Diagnosis not present

## 2022-05-15 DIAGNOSIS — Z91010 Allergy to peanuts: Secondary | ICD-10-CM | POA: Diagnosis not present

## 2022-05-15 DIAGNOSIS — E872 Acidosis, unspecified: Secondary | ICD-10-CM | POA: Diagnosis not present

## 2022-05-15 DIAGNOSIS — Z9682 Presence of neurostimulator: Secondary | ICD-10-CM | POA: Diagnosis not present

## 2022-05-15 DIAGNOSIS — K219 Gastro-esophageal reflux disease without esophagitis: Secondary | ICD-10-CM | POA: Diagnosis not present

## 2022-05-15 DIAGNOSIS — Z743 Need for continuous supervision: Secondary | ICD-10-CM | POA: Diagnosis not present

## 2022-05-15 DIAGNOSIS — A419 Sepsis, unspecified organism: Secondary | ICD-10-CM | POA: Diagnosis not present

## 2022-05-15 DIAGNOSIS — E860 Dehydration: Secondary | ICD-10-CM | POA: Diagnosis not present

## 2022-05-15 DIAGNOSIS — G4700 Insomnia, unspecified: Secondary | ICD-10-CM | POA: Diagnosis not present

## 2022-05-15 DIAGNOSIS — G629 Polyneuropathy, unspecified: Secondary | ICD-10-CM | POA: Diagnosis not present

## 2022-05-15 DIAGNOSIS — G8929 Other chronic pain: Secondary | ICD-10-CM | POA: Diagnosis not present

## 2022-05-15 DIAGNOSIS — F32A Depression, unspecified: Secondary | ICD-10-CM | POA: Diagnosis not present

## 2022-05-15 DIAGNOSIS — F419 Anxiety disorder, unspecified: Secondary | ICD-10-CM | POA: Diagnosis not present

## 2022-05-15 DIAGNOSIS — N400 Enlarged prostate without lower urinary tract symptoms: Secondary | ICD-10-CM | POA: Diagnosis not present

## 2022-05-15 DIAGNOSIS — M1990 Unspecified osteoarthritis, unspecified site: Secondary | ICD-10-CM | POA: Diagnosis not present

## 2022-05-15 DIAGNOSIS — Z20822 Contact with and (suspected) exposure to covid-19: Secondary | ICD-10-CM | POA: Diagnosis not present

## 2022-05-15 DIAGNOSIS — R6883 Chills (without fever): Secondary | ICD-10-CM | POA: Diagnosis not present

## 2022-05-15 DIAGNOSIS — Z79899 Other long term (current) drug therapy: Secondary | ICD-10-CM | POA: Diagnosis not present

## 2022-05-16 DIAGNOSIS — R112 Nausea with vomiting, unspecified: Secondary | ICD-10-CM | POA: Diagnosis not present

## 2022-05-16 DIAGNOSIS — I10 Essential (primary) hypertension: Secondary | ICD-10-CM | POA: Diagnosis not present

## 2022-05-16 DIAGNOSIS — E860 Dehydration: Secondary | ICD-10-CM | POA: Diagnosis not present

## 2022-05-17 DIAGNOSIS — E860 Dehydration: Secondary | ICD-10-CM | POA: Diagnosis not present

## 2022-05-17 DIAGNOSIS — R112 Nausea with vomiting, unspecified: Secondary | ICD-10-CM | POA: Diagnosis not present

## 2022-05-17 DIAGNOSIS — I10 Essential (primary) hypertension: Secondary | ICD-10-CM | POA: Diagnosis not present

## 2022-05-23 DIAGNOSIS — E876 Hypokalemia: Secondary | ICD-10-CM | POA: Diagnosis not present

## 2022-05-23 DIAGNOSIS — Z6829 Body mass index (BMI) 29.0-29.9, adult: Secondary | ICD-10-CM | POA: Diagnosis not present

## 2022-05-23 DIAGNOSIS — R69 Illness, unspecified: Secondary | ICD-10-CM | POA: Diagnosis not present

## 2022-05-23 DIAGNOSIS — R413 Other amnesia: Secondary | ICD-10-CM | POA: Diagnosis not present

## 2022-05-23 DIAGNOSIS — G629 Polyneuropathy, unspecified: Secondary | ICD-10-CM | POA: Diagnosis not present

## 2022-05-23 DIAGNOSIS — I10 Essential (primary) hypertension: Secondary | ICD-10-CM | POA: Diagnosis not present

## 2022-05-28 DIAGNOSIS — Z79891 Long term (current) use of opiate analgesic: Secondary | ICD-10-CM | POA: Diagnosis not present

## 2022-05-28 DIAGNOSIS — M5137 Other intervertebral disc degeneration, lumbosacral region: Secondary | ICD-10-CM | POA: Diagnosis not present

## 2022-05-28 DIAGNOSIS — G629 Polyneuropathy, unspecified: Secondary | ICD-10-CM | POA: Diagnosis not present

## 2022-05-28 DIAGNOSIS — Z1389 Encounter for screening for other disorder: Secondary | ICD-10-CM | POA: Diagnosis not present

## 2022-05-28 DIAGNOSIS — G609 Hereditary and idiopathic neuropathy, unspecified: Secondary | ICD-10-CM | POA: Diagnosis not present

## 2022-06-03 ENCOUNTER — Encounter: Payer: Self-pay | Admitting: Physician Assistant

## 2022-06-09 ENCOUNTER — Encounter: Payer: Self-pay | Admitting: Physician Assistant

## 2022-06-09 ENCOUNTER — Other Ambulatory Visit (INDEPENDENT_AMBULATORY_CARE_PROVIDER_SITE_OTHER): Payer: Medicare HMO

## 2022-06-09 ENCOUNTER — Ambulatory Visit: Payer: Medicare HMO | Admitting: Physician Assistant

## 2022-06-09 VITALS — BP 164/93 | HR 66 | Resp 20 | Ht 73.0 in | Wt 227.0 lb

## 2022-06-09 DIAGNOSIS — R413 Other amnesia: Secondary | ICD-10-CM

## 2022-06-09 LAB — VITAMIN B12: Vitamin B-12: 441 pg/mL (ref 211–911)

## 2022-06-09 LAB — TSH: TSH: 1.59 u[IU]/mL (ref 0.35–5.50)

## 2022-06-09 NOTE — Progress Notes (Signed)
Please inform patient B12 and Thyroid levels are normal, thanks

## 2022-06-09 NOTE — Patient Instructions (Addendum)
It was a pleasure to see you today at our office.   Recommendations:  Neurocognitive evaluation at our office MRI of the brain, the radiology office will call you to arrange you appointment Check labs today Follow up in Nov 17 at 11:30   Whom to call:  Memory  decline, memory medications: Call our office [phone]   For psychiatric meds, mood meds: Please have your primary care physician manage these medications.   Counseling regarding caregiver distress, including caregiver depression, anxiety and issues regarding community resources, adult day care programs, adult living facilities, or memory care questions:   Feel free to contact [place], Social Worker at [phone]   For assessment of decision of mental capacity and competency:  Call Dr. Anthoney Harada, geriatric psychiatrist at [phone]  For guidance in geriatric dementia issues please call [facility] [phone]  For guidance regarding [facility] and if placement were needed at the facility, contact Arnell Asal, Social Worker tel: [phone]  If you have any severe symptoms of a stroke, or other severe issues such as confusion,severe chills or fever, etc call 911 or go to the ER as you may need to be evaluated further   Feel free to visit Facebook page " [employer]" for tips of how to care for people with memory problems.      RECOMMENDATIONS FOR ALL PATIENTS WITH MEMORY PROBLEMS: 1. Continue to exercise (Recommend 30 minutes of walking everyday, or 3 hours every week) 2. Increase social interactions - continue going to [place] and enjoy social gatherings with friends and family 3. Eat healthy, avoid fried foods and eat more fruits and vegetables 4. Maintain adequate blood pressure, blood sugar, and blood cholesterol level. Reducing the risk of stroke and cardiovascular disease also helps promoting better memory. 5. Avoid stressful situations. Live a simple life and  avoid aggravations. Organize your time and prepare for the next day in anticipation. 6. Sleep well, avoid any interruptions of sleep and avoid any distractions in the bedroom that may interfere with adequate sleep quality 7. Avoid sugar, avoid sweets as there is a strong link between excessive sugar intake, diabetes, and cognitive impairment We discussed the Mediterranean diet, which has been shown to help patients reduce the risk of progressive memory disorders and reduces cardiovascular risk. This includes eating fish, eat fruits and green leafy vegetables, nuts like almonds and hazelnuts, walnuts, and also use olive oil. Avoid fast foods and fried foods as much as possible. Avoid sweets and sugar as sugar use has been linked to worsening of memory function.  There is always a concern of gradual progression of memory problems. If this is the case, then we may need to adjust level of care according to patient needs. Support, both to the patient and caregiver, should then be put into place.      You have been referred for a neuropsychological evaluation (i.e., evaluation of memory and thinking abilities). Please bring someone with you to this appointment if possible, as it is helpful for the doctor to hear from both you and another adult who knows you well. Please bring eyeglasses and hearing aids if you wear them.    The evaluation will take approximately 3 hours and has two parts:   The first part is a clinical interview with the neuropsychologist (Dr. Melvyn Novas or Dr. Nicole Kindred). During the interview, the neuropsychologist will speak with you and the individual you brought to the appointment.    The second part of the evaluation  is testing with the doctor's technician Hinton Dyer or Maudie Mercury). During the testing, the technician will ask you to remember different types of material, solve problems, and answer some questionnaires. Your family member will not be present for this portion of the evaluation.   Please  note: We must reserve several hours of the neuropsychologist's time and the psychometrician's time for your evaluation appointment. As such, there is a No-Show fee of $100. If you are unable to attend any of your appointments, please contact our office as soon as possible to reschedule.    FALL PRECAUTIONS: Be cautious when walking. Scan the area for obstacles that may increase the risk of trips and falls. When getting up in the mornings, sit up at the edge of the bed for a few minutes before getting out of bed. Consider elevating the bed at the head end to avoid drop of blood pressure when getting up. Walk always in a well-lit room (use night lights in the walls). Avoid area rugs or power cords from appliances in the middle of the walkways. Use a walker or a cane if necessary and consider physical therapy for balance exercise. Get your eyesight checked regularly.  FINANCIAL OVERSIGHT: Supervision, especially oversight when making financial decisions or transactions is also recommended.  HOME SAFETY: Consider the safety of the kitchen when operating appliances like stoves, microwave oven, and blender. Consider having supervision and share cooking responsibilities until no longer able to participate in those. Accidents with firearms and other hazards in the house should be identified and addressed as well.   ABILITY TO BE LEFT ALONE: If patient is unable to contact 911 operator, consider using LifeLine, or when the need is there, arrange for someone to stay with patients. Smoking is a fire hazard, consider supervision or cessation. Risk of wandering should be assessed by caregiver and if detected at any point, supervision and safe proof recommendations should be instituted.  MEDICATION SUPERVISION: Inability to self-administer medication needs to be constantly addressed. Implement a mechanism to ensure safe administration of the medications.   DRIVING: Regarding driving, in patients with progressive  memory problems, driving will be impaired. We advise to have someone else do the driving if trouble finding directions or if minor accidents are reported. Independent driving assessment is available to determine safety of driving.   If you are interested in the driving assessment, you can contact the following:  [employer] in [place]  [place] [phone] or [phone]    [place] refers to food and lifestyle choices that are based on the traditions of countries located on the [employer]. This way of eating has been shown to help prevent certain conditions and improve outcomes for people who have chronic diseases, like kidney disease and heart disease. What are tips for following this plan? Lifestyle  Cook and eat meals together with your family, when possible. Drink enough fluid to keep your urine clear or pale yellow. Be physically active every day. This includes: Aerobic exercise like running or swimming. Leisure activities like gardening, walking, or housework. Get 7-8 hours of sleep each night. If recommended by your health care provider, drink red wine in moderation. This means 1 glass a day for nonpregnant women and 2 glasses a day for men. A glass of wine equals 5 oz (150 mL). Reading food labels  Check the serving size of packaged foods. For foods such as rice and pasta,  the serving size refers to the amount of cooked product, not dry. Check the total fat in packaged foods. Avoid foods that have saturated fat or trans fats. Check the ingredients list for added sugars, such as corn syrup. Shopping  At the grocery store, buy most of your food from the areas near the walls of the store. This includes: Fresh fruits and vegetables (produce). Grains, beans, nuts, and seeds. Some of these may be available in unpackaged forms  or large amounts (in bulk). Fresh seafood. Poultry and eggs. Low-fat dairy products. Buy whole ingredients instead of prepackaged foods. Buy fresh fruits and vegetables in-season from local farmers markets. Buy frozen fruits and vegetables in resealable bags. If you do not have access to quality fresh seafood, buy precooked frozen shrimp or canned fish, such as tuna, salmon, or sardines. Buy small amounts of raw or cooked vegetables, salads, or olives from the deli or salad bar at your store. Stock your pantry so you always have certain foods on hand, such as olive oil, canned tuna, canned tomatoes, rice, pasta, and beans. Cooking  Cook foods with extra-virgin olive oil instead of using butter or other vegetable oils. Have meat as a side dish, and have vegetables or grains as your main dish. This means having meat in small portions or adding small amounts of meat to foods like pasta or stew. Use beans or vegetables instead of meat in common dishes like chili or lasagna. Experiment with different cooking methods. Try roasting or broiling vegetables instead of steaming or sauteing them. Add frozen vegetables to soups, stews, pasta, or rice. Add nuts or seeds for added healthy fat at each meal. You can add these to yogurt, salads, or vegetable dishes. Marinate fish or vegetables using olive oil, lemon juice, garlic, and fresh herbs. Meal planning  Plan to eat 1 vegetarian meal one day each week. Try to work up to 2 vegetarian meals, if possible. Eat seafood 2 or more times a week. Have healthy snacks readily available, such as: Vegetable sticks with hummus. Greek yogurt. Fruit and nut trail mix. Eat balanced meals throughout the week. This includes: Fruit: 2-3 servings a day Vegetables: 4-5 servings a day Low-fat dairy: 2 servings a day Fish, poultry, or lean meat: 1 serving a day Beans and legumes: 2 or more servings a week Nuts and seeds: 1-2 servings a day Whole grains: 6-8 servings  a day Extra-virgin olive oil: 3-4 servings a day Limit red meat and sweets to only a few servings a month What are my food choices? Mediterranean diet Recommended Grains: Whole-grain pasta. Brown rice. Bulgar wheat. Polenta. Couscous. Whole-wheat bread. [place]. Vegetables: Artichokes. Beets. Broccoli. Cabbage. Carrots. Eggplant. Green beans. Chard. Kale. Spinach. Onions. Leeks. Peas. Squash. Tomatoes. Peppers. Radishes. Fruits: Apples. Apricots. Avocado. Berries. Bananas. Cherries. Dates. Figs. Grapes. Lemons. Melon. Oranges. Peaches. Plums. Pomegranate. Meats and other protein foods: Beans. Almonds. Sunflower seeds. Pine nuts. Peanuts. [place]. Salmon. Scallops. Shrimp. [place]. Tilapia. Clams. Oysters. Eggs. Dairy: Low-fat milk. Cheese. Greek yogurt. Beverages: Water. Red wine. Herbal tea. Fats and oils: Extra virgin olive oil. Avocado oil. Grape seed oil. Sweets and desserts: [place] yogurt with honey. Baked apples. Poached pears. Trail mix. Seasoning and other foods: Basil. Cilantro. Coriander. Cumin. Mint. Parsley. Sage. Rosemary. Tarragon. Garlic. Oregano. Thyme. Pepper. Balsalmic vinegar. Tahini. Hummus. Tomato sauce. Olives. Mushrooms. Limit these Grains: Prepackaged pasta or rice dishes. Prepackaged cereal with added sugar. Vegetables: Deep fried potatoes (french fries). Fruits: Fruit canned in syrup. Meats and other protein foods:  Beef. Pork. Lamb. Poultry with skin. Hot dogs. [place] Salines. Dairy: Ice cream. Sour cream. Whole milk. Beverages: Juice. Sugar-sweetened soft drinks. Beer. Liquor and spirits. Fats and oils: Butter. Canola oil. Vegetable oil. Beef fat (tallow). Lard. Sweets and desserts: Cookies. Cakes. Pies. Candy. Seasoning and other foods: Mayonnaise. Premade sauces and marinades. The items listed may not be a complete list. Talk with your dietitian about what dietary choices are right for you. Summary The Mediterranean diet includes both food and lifestyle choices. Eat a  variety of fresh fruits and vegetables, beans, nuts, seeds, and whole grains. Limit the amount of red meat and sweets that you eat. Talk with your health care provider about whether it is safe for you to drink red wine in moderation. This means 1 glass a day for nonpregnant women and 2 glasses a day for men. A glass of wine equals 5 oz (150 mL). This information is not intended to replace advice given to you by your health care provider. Make sure you discuss any questions you have with your health care provider. Document Released: 06/12/2016 Document Revised: 07/15/2016 Document Reviewed: 06/12/2016 Elsevier Interactive Patient Education  2017 [employer].   We have sent a referral to [facility] for your CT and they will call you directly to schedule your appointment. They are located at [address]. If you need to contact them directly please call [phone].  Your provider has requested that you have labwork completed today. Please go to [facility] ([id number]) on the second floor of this building before leaving the office today. You do not need to check in. If you are not called within 15 minutes please check with the front desk.
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
## 2022-06-16 ENCOUNTER — Other Ambulatory Visit: Payer: Self-pay | Admitting: Behavioral Health

## 2022-06-16 DIAGNOSIS — F331 Major depressive disorder, recurrent, moderate: Secondary | ICD-10-CM

## 2022-06-17 NOTE — Telephone Encounter (Signed)
Please call to schedule an appt with [name]. Was a no show last appt.

## 2022-06-17 NOTE — Telephone Encounter (Signed)
Pt is scheduled for 8/21

## 2022-06-20 ENCOUNTER — Ambulatory Visit
Admission: RE | Admit: 2022-06-20 | Discharge: 2022-06-20 | Disposition: A | Discharge status: Home / Self Care | Payer: Medicare HMO | Source: Ambulatory Visit | Attending: Physician Assistant | Admitting: Physician Assistant

## 2022-06-20 DIAGNOSIS — R413 Other amnesia: Secondary | ICD-10-CM | POA: Diagnosis not present

## 2022-06-20 DIAGNOSIS — R9082 White matter disease, unspecified: Secondary | ICD-10-CM | POA: Diagnosis not present

## 2022-06-20 DIAGNOSIS — I672 Cerebral atherosclerosis: Secondary | ICD-10-CM | POA: Diagnosis not present

## 2022-06-23 ENCOUNTER — Ambulatory Visit: Payer: Medicare HMO | Admitting: Behavioral Health

## 2022-06-23 ENCOUNTER — Encounter: Payer: Self-pay | Admitting: Behavioral Health

## 2022-06-23 DIAGNOSIS — F5105 Insomnia due to other mental disorder: Secondary | ICD-10-CM | POA: Diagnosis not present

## 2022-06-23 DIAGNOSIS — F331 Major depressive disorder, recurrent, moderate: Secondary | ICD-10-CM | POA: Diagnosis not present

## 2022-06-23 DIAGNOSIS — F333 Major depressive disorder, recurrent, severe with psychotic symptoms: Secondary | ICD-10-CM

## 2022-06-23 DIAGNOSIS — R69 Illness, unspecified: Secondary | ICD-10-CM | POA: Diagnosis not present

## 2022-06-23 DIAGNOSIS — F99 Mental disorder, not otherwise specified: Secondary | ICD-10-CM | POA: Diagnosis not present

## 2022-06-23 DIAGNOSIS — F411 Generalized anxiety disorder: Secondary | ICD-10-CM

## 2022-06-23 MED ORDER — ESCITALOPRAM OXALATE 20 MG PO TABS: 20.0000 mg | ORAL_TABLET | Freq: Every day | ORAL | 3 refills | Status: DC

## 2022-06-23 MED ORDER — TRAZODONE HCL 50 MG PO TABS: 50.0000 mg | ORAL_TABLET | Freq: Every evening | ORAL | 1 refills | Status: DC | PRN

## 2022-06-23 NOTE — Progress Notes (Signed)
[facility]  Patient ID: Joel Gross,  MRN: [id number]  PCP: [name], MD  Date of Evaluation: 06/23/2022 Time spent:30 minutes  Chief Complaint:   HISTORY/CURRENT STATUS: HPI  71 year old male presents to this office for follow up and medication management. He is alert and gait has significantly improved but walking with cane. It has been 8 months since last visit. He says that his family problems have worsened. His wife is still living separate across the road from him. His children will not talk to him.  He says he had a 6 day stay in hospital for his nerves earlier in the year. He says that his medication have changes or he stopped taking some of the medications he was originally prescribed here. Still under pain management care and takes opioids on regular basis.  Says his depression today is 4/10 and anxiety 5/10. Reports sleeping 7-8 hours per night.  Did admit to some experimentation with Delta 8 and gummies containing THC. He does not want a medication adjustment at this time. No mania or psychosis. No SI/HI.   Past psychiatric medication failures: Risperidone Flexeril amiltriptyline  Xanax Cymbalta Abilify Buspar      Individual Medical History/ Review of Systems: Changes? :No   Allergies: Risperidone, Risperidone and related, and Hctz [hydrochlorothiazide]  Current Medications:  Current Outpatient Medications:    escitalopram (LEXAPRO) 20 MG tablet, Take 1 tablet (20 mg total) by mouth daily., Disp: 30 tablet, Rfl: 3   ALPRAZolam (XANAX) 0.25 MG tablet, Take 0.25-0.5 mg by mouth 2 (two) times daily as needed., Disp: , Rfl:    amLODipine (NORVASC) 10 MG tablet, Take 10 mg by mouth daily., Disp: , Rfl:    amLODipine (NORVASC) 10 MG tablet, Take by mouth., Disp: , Rfl:    atorvastatin (LIPITOR) 10 MG tablet, take 1/2 tablet by oral route  every day, Disp: , Rfl:    finasteride (PROSCAR) 5 MG tablet, Take 5 mg by mouth daily., Disp: , Rfl:     furosemide (LASIX) 20 MG tablet, Take 20 mg by mouth daily., Disp: , Rfl:    HYDROcodone-acetaminophen (NORCO) 7.5-325 MG tablet, Take 1 tablet by mouth 3 (three) times daily., Disp: , Rfl:    lisinopril (PRINIVIL,ZESTRIL) 40 MG tablet, Take 40 mg by mouth daily., Disp: , Rfl:    lisinopril (ZESTRIL) 40 MG tablet, Take by mouth., Disp: , Rfl:    Multiple Vitamin (MULTI-VITAMIN) tablet, , Disp: , Rfl:    ofloxacin (OCUFLOX) 0.3 % ophthalmic solution, , Disp: , Rfl:    oxyCODONE-acetaminophen (PERCOCET) 7.5-325 MG tablet, Take 1 tablet by mouth 3 (three) times daily as needed., Disp: , Rfl:    Potassium Chloride ER 20 MEQ TBCR, Take 5 tablets by mouth daily., Disp: , Rfl:    potassium chloride SA (K-DUR,KLOR-CON) 20 MEQ tablet, Take 20 mEq by mouth 2 (two) times daily., Disp: , Rfl:    pravastatin (PRAVACHOL) 20 MG tablet, Take 40 mg by mouth daily., Disp: , Rfl:    prednisoLONE acetate (PRED FORTE) 1 % ophthalmic suspension, Place into the left eye., Disp: , Rfl:    pregabalin (LYRICA) 200 MG capsule, Take 1 capsule (200 mg total) by mouth 3 (three) times daily., Disp: 90 capsule, Rfl: 0   pregabalin (LYRICA) 200 MG capsule, Take by mouth., Disp: , Rfl:    rosuvastatin (CRESTOR) 5 MG tablet, Take 5 mg by mouth at bedtime as needed., Disp: , Rfl:    silodosin (RAPAFLO) 8  MG CAPS capsule, Take 1 capsule by mouth at bedtime. (Patient not taking: Reported on 08/27/2021), Disp: , Rfl:    tamsulosin (FLOMAX) 0.4 MG CAPS capsule, Take 0.4 mg by mouth daily., Disp: , Rfl:    tamsulosin (FLOMAX) 0.4 MG CAPS capsule, Take by mouth., Disp: , Rfl:    traZODone (DESYREL) 50 MG tablet, Take 1-2 tablets (50-100 mg total) by mouth at bedtime as needed., Disp: 60 tablet, Rfl: 1 Medication Side Effects: none  Family Medical/ Social History: Changes? No  MENTAL HEALTH EXAM:  There were no vitals taken for this visit.There is no height or weight on file to calculate BMI.  General Appearance: Casual, Neat, and  Well Groomed  Eye Contact:  Good  Speech:  Clear and Coherent and Talkative  Volume:  Normal  Mood:  Anxious, Depressed, and Dysphoric  Affect:  Appropriate, Depressed, and Anxious  Thought Process:  Coherent  Orientation:  Full (Time, Place, and Person)  Thought Content: Logical   Suicidal Thoughts:  No  Homicidal Thoughts:  No  Memory:  WNL  Judgement:  Good  Insight:  Fair  Psychomotor Activity:  Normal  Concentration:  Concentration: Fair  Recall:  [employer] of Knowledge: Fair  Language: Fair  Assets:  Desire for Improvement Resilience Social Support  ADL's:  Intact  Cognition: WNL  Prognosis:  Good    DIAGNOSES:    ICD-10-CM   1. Generalized anxiety disorder  F41.1 escitalopram (LEXAPRO) 20 MG tablet    2. 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 Insomnia due to other mental disorder  F51.05 traZODone (DESYREL) 50 MG tablet   F99       Receiving Psychotherapy: No    RECOMMENDATIONS:   To reduced Cymbalta to 30 mg daily for 7 days, then every other day for 7 days and then stop. To increase Lexapro to 20 mg daily Continue with Trazodone 50-100 mg at bedtime daily. To continue with xanax 0.25 mg twice daily as needed for severe anxiety Will report any side effects or worsening symptoms To follow up in 4 weeks to reassess. Provided emergency contact information .   Greater than 50% of 30  min. face to face time with patient was spent on counseling and coordination of care. We discussed his numerous health concerns and lack of follow up with this office in last 8 months. Medication have changed or he has been non-compliant with regimen since last visit. We reviewed his current medications and discussed plan to continue to assist with his anxiety and depression.  Counseled patient on use of THC and Delta 8 possible  inducing or triggering underlying psychosis. Refills escribed to pharmacy.       [name] A. Maxim Bedel, NP

## 2022-06-23 NOTE — Progress Notes (Signed)
Please inform patient the CT of the head does not show any acute findings, same chronic changes as before.thanks

## 2022-06-26 DIAGNOSIS — Z1389 Encounter for screening for other disorder: Secondary | ICD-10-CM | POA: Diagnosis not present

## 2022-06-26 DIAGNOSIS — G609 Hereditary and idiopathic neuropathy, unspecified: Secondary | ICD-10-CM | POA: Diagnosis not present

## 2022-06-26 DIAGNOSIS — M5137 Other intervertebral disc degeneration, lumbosacral region: Secondary | ICD-10-CM | POA: Diagnosis not present

## 2022-06-26 DIAGNOSIS — Z79891 Long term (current) use of opiate analgesic: Secondary | ICD-10-CM | POA: Diagnosis not present

## 2022-06-26 DIAGNOSIS — G629 Polyneuropathy, unspecified: Secondary | ICD-10-CM | POA: Diagnosis not present

## 2022-07-11 DIAGNOSIS — R69 Illness, unspecified: Secondary | ICD-10-CM | POA: Diagnosis not present

## 2022-07-11 DIAGNOSIS — Z6829 Body mass index (BMI) 29.0-29.9, adult: Secondary | ICD-10-CM | POA: Diagnosis not present

## 2022-07-11 DIAGNOSIS — D510 Vitamin B12 deficiency anemia due to intrinsic factor deficiency: Secondary | ICD-10-CM | POA: Diagnosis not present

## 2022-07-11 DIAGNOSIS — R413 Other amnesia: Secondary | ICD-10-CM | POA: Diagnosis not present

## 2022-07-21 ENCOUNTER — Encounter: Payer: Self-pay | Admitting: Behavioral Health

## 2022-07-21 ENCOUNTER — Ambulatory Visit (INDEPENDENT_AMBULATORY_CARE_PROVIDER_SITE_OTHER): Payer: Medicare HMO | Admitting: Behavioral Health

## 2022-07-21 DIAGNOSIS — F5105 Insomnia due to other mental disorder: Secondary | ICD-10-CM | POA: Diagnosis not present

## 2022-07-21 DIAGNOSIS — F411 Generalized anxiety disorder: Secondary | ICD-10-CM

## 2022-07-21 DIAGNOSIS — F99 Mental disorder, not otherwise specified: Secondary | ICD-10-CM | POA: Diagnosis not present

## 2022-07-21 DIAGNOSIS — F331 Major depressive disorder, recurrent, moderate: Secondary | ICD-10-CM | POA: Diagnosis not present

## 2022-07-21 DIAGNOSIS — R69 Illness, unspecified: Secondary | ICD-10-CM | POA: Diagnosis not present

## 2022-07-21 MED ORDER — ALPRAZOLAM 0.25 MG PO TABS: 0.2500 mg | ORAL_TABLET | Freq: Two times a day (BID) | ORAL | 1 refills | Status: DC | PRN

## 2022-07-21 MED ORDER — TRAZODONE HCL 50 MG PO TABS: 50.0000 mg | ORAL_TABLET | Freq: Every evening | ORAL | 2 refills | Status: DC | PRN

## 2022-07-21 MED ORDER — CARIPRAZINE HCL 1.5 MG PO CAPS: 1.5000 mg | ORAL_CAPSULE | Freq: Every day | ORAL | 1 refills | Status: DC

## 2022-07-21 NOTE — Progress Notes (Signed)
[facility]  Patient ID: Joel Gross,  MRN: [id number]  PCP: [name], MD  Date of Evaluation: 07/21/2022 Time spent:30 minutes  Chief Complaint:   HISTORY/CURRENT STATUS: HPI   71 year old male presents to this office for follow up and medication management. He is alert but a little unstable walking today. He is using 4-point cane to assist. Says that he still feels pretty depressed and seeing no improvement since increasing Lexapro. Says that he is just tired of feeling this way and needs some help. He is requesting change to his medications or adjustment.   Says his depression today is 6/10 and anxiety 5/10. Reports sleeping 7-8 hours per night.  Did admit to some experimentation with Delta 8 and gummies containing THC. He does not want a medication adjustment at this time. No mania or psychosis. No SI/HI.   Past psychiatric medication failures: Risperidone Flexeril amiltriptyline  Xanax Cymbalta Abilify Buspar Wellbutrin     Individual Medical History/ Review of Systems: Changes? :No   Allergies: Risperidone, Risperidone and related, and Hctz [hydrochlorothiazide]  Current Medications:  Current Outpatient Medications:    cariprazine (VRAYLAR) 1.5 MG capsule, Take 1 capsule (1.5 mg total) by mouth daily., Disp: 30 capsule, Rfl: 1   ALPRAZolam (XANAX) 0.25 MG tablet, Take 1-2 tablets (0.25-0.5 mg total) by mouth 2 (two) times daily as needed., Disp: 30 tablet, Rfl: 1   amLODipine (NORVASC) 10 MG tablet, Take 10 mg by mouth daily., Disp: , Rfl:    amLODipine (NORVASC) 10 MG tablet, Take by mouth., Disp: , Rfl:    atorvastatin (LIPITOR) 10 MG tablet, take 1/2 tablet by oral route  every day, Disp: , Rfl:    escitalopram (LEXAPRO) 20 MG tablet, Take 1 tablet (20 mg total) by mouth daily., Disp: 30 tablet, Rfl: 3   finasteride (PROSCAR) 5 MG tablet, Take 5 mg by mouth daily., Disp: , Rfl:    furosemide (LASIX) 20 MG tablet, Take 20 mg by mouth daily.,  Disp: , Rfl:    HYDROcodone-acetaminophen (NORCO) 7.5-325 MG tablet, Take 1 tablet by mouth 3 (three) times daily., Disp: , Rfl:    lisinopril (PRINIVIL,ZESTRIL) 40 MG tablet, Take 40 mg by mouth daily., Disp: , Rfl:    lisinopril (ZESTRIL) 40 MG tablet, Take by mouth., Disp: , Rfl:    Multiple Vitamin (MULTI-VITAMIN) tablet, , Disp: , Rfl:    ofloxacin (OCUFLOX) 0.3 % ophthalmic solution, , Disp: , Rfl:    oxyCODONE-acetaminophen (PERCOCET) 7.5-325 MG tablet, Take 1 tablet by mouth 3 (three) times daily as needed., Disp: , Rfl:    Potassium Chloride ER 20 MEQ TBCR, Take 5 tablets by mouth daily., Disp: , Rfl:    potassium chloride SA (K-DUR,KLOR-CON) 20 MEQ tablet, Take 20 mEq by mouth 2 (two) times daily., Disp: , Rfl:    pravastatin (PRAVACHOL) 20 MG tablet, Take 40 mg by mouth daily., Disp: , Rfl:    prednisoLONE acetate (PRED FORTE) 1 % ophthalmic suspension, Place into the left eye., Disp: , Rfl:    pregabalin (LYRICA) 200 MG capsule, Take 1 capsule (200 mg total) by mouth 3 (three) times daily., Disp: 90 capsule, Rfl: 0   pregabalin (LYRICA) 200 MG capsule, Take by mouth., Disp: , Rfl:    rosuvastatin (CRESTOR) 5 MG tablet, Take 5 mg by mouth at bedtime as needed., Disp: , Rfl:    silodosin (RAPAFLO) 8 MG CAPS capsule, Take 1 capsule by mouth at bedtime. (Patient not taking: Reported on  08/27/2021), Disp: , Rfl:    tamsulosin (FLOMAX) 0.4 MG CAPS capsule, Take 0.4 mg by mouth daily., Disp: , Rfl:    tamsulosin (FLOMAX) 0.4 MG CAPS capsule, Take by mouth., Disp: , Rfl:    traZODone (DESYREL) 50 MG tablet, Take 1-2 tablets (50-100 mg total) by mouth at bedtime as needed., Disp: 60 tablet, Rfl: 2 Medication Side Effects: none  Family Medical/ Social History: Changes? No  MENTAL HEALTH EXAM:  There were no vitals taken for this visit.There is no height or weight on file to calculate BMI.  General Appearance: Casual, Neat, and Well Groomed  Eye Contact:  Good  Speech:  Clear and Coherent   Volume:  Normal  Mood:  Anxious, Depressed, and Dysphoric  Affect:  Appropriate  Thought Process:  Coherent  Orientation:  Full (Time, Place, and Person)  Thought Content: Logical   Suicidal Thoughts:  No  Homicidal Thoughts:  No  Memory:  WNL  Judgement:  Good  Insight:  Good  Psychomotor Activity:  Normal  Concentration:  Concentration: Good  Recall:  Good  Fund of Knowledge: Good  Language: Good  Assets:  Desire for Improvement  ADL's:  Intact  Cognition: WNL  Prognosis:  Good    DIAGNOSES:    ICD-10-CM   1. Generalized anxiety disorder  F41.1 cariprazine (VRAYLAR) 1.5 MG capsule    ALPRAZolam (XANAX) 0.25 MG tablet    2. Major depressive disorder, recurrent episode, moderate (HCC)  F33.1 cariprazine (VRAYLAR) 1.5 MG capsule    3. Insomnia due to other mental disorder  F51.05 traZODone (DESYREL) 50 MG tablet   F99       Receiving Psychotherapy: No    RECOMMENDATIONS:   Greater than 50% of 30  min. face to face time with patient was spent on counseling and coordination of care. We discussed him reporting no improvement since increasing his Lexapro with anxiety and depression. We discussed his long battle with anxiety and depression for many years and multiple unsuccessful medication trials.  He says that he just wants to get better and nothing seems to work. He does acknowledge a past hx of being non compliant with some of his medications. He is requesting medication adjustment or change today. Counseled patient on use of THC and Delta 8 possible inducing or triggering underlying psychosis. Refills escribed to pharmacy.    Continue  Lexapro to 20 mg daily Continue with Trazodone 50-100 mg at bedtime daily. To continue with xanax 0.25 mg twice daily as needed for severe anxiety To start Vraylar 1.5 mg daily after breakfast Will report any side effects or worsening symptoms To follow up in 4 weeks to reassess. Provided emergency contact information .    [name] A.  Stanisha Lorenz, NP

## 2022-07-24 ENCOUNTER — Telehealth: Payer: Self-pay | Admitting: Behavioral Health

## 2022-07-24 NOTE — Telephone Encounter (Signed)
Next appt is 08/18/22. Gavan states that [facility] called him and said it would be $443.00 for his Vraylar to be filled. States he can't afford this. Asks if there is a Dietitian card that he could get or another option to where he has to pay so much. His number is [phone]

## 2022-07-24 NOTE — Telephone Encounter (Signed)
This was a new med prescribed I think it may need a PA

## 2022-07-25 DIAGNOSIS — Z79891 Long term (current) use of opiate analgesic: Secondary | ICD-10-CM | POA: Diagnosis not present

## 2022-07-25 DIAGNOSIS — G894 Chronic pain syndrome: Secondary | ICD-10-CM | POA: Diagnosis not present

## 2022-07-25 DIAGNOSIS — G629 Polyneuropathy, unspecified: Secondary | ICD-10-CM | POA: Diagnosis not present

## 2022-07-25 DIAGNOSIS — G609 Hereditary and idiopathic neuropathy, unspecified: Secondary | ICD-10-CM | POA: Diagnosis not present

## 2022-07-25 DIAGNOSIS — M5137 Other intervertebral disc degeneration, lumbosacral region: Secondary | ICD-10-CM | POA: Diagnosis not present

## 2022-07-25 DIAGNOSIS — Z1389 Encounter for screening for other disorder: Secondary | ICD-10-CM | POA: Diagnosis not present

## 2022-07-25 NOTE — Telephone Encounter (Signed)
Ok, he was saying he had some extra supplemental insurance but now sure. We can try Pt assistance if medication helps

## 2022-07-25 NOTE — Telephone Encounter (Signed)
Noted, thank you

## 2022-07-25 NOTE — Telephone Encounter (Signed)
We must not have the information for any supplemental insurance. Let me discuss with Leda Gauze then I will follow up.  I will also make sure he has tried it first as well.

## 2022-07-25 NOTE — Telephone Encounter (Signed)
Pt does not need a PA for Vraylar, he has medicare. Unable to use co-pay card. Only option is pt. Assistance if he qualifies.

## 2022-08-04 NOTE — Telephone Encounter (Signed)
Contacted [facility] and after Medicare and any supplemental coverage the cost is down to $491.08 for Vraylar.   Will contact pt

## 2022-08-18 ENCOUNTER — Ambulatory Visit (INDEPENDENT_AMBULATORY_CARE_PROVIDER_SITE_OTHER): Payer: Medicare HMO | Admitting: Behavioral Health

## 2022-08-18 ENCOUNTER — Encounter: Payer: Self-pay | Admitting: Behavioral Health

## 2022-08-18 DIAGNOSIS — F99 Mental disorder, not otherwise specified: Secondary | ICD-10-CM

## 2022-08-18 DIAGNOSIS — F5105 Insomnia due to other mental disorder: Secondary | ICD-10-CM

## 2022-08-18 DIAGNOSIS — R69 Illness, unspecified: Secondary | ICD-10-CM | POA: Diagnosis not present

## 2022-08-18 DIAGNOSIS — F411 Generalized anxiety disorder: Secondary | ICD-10-CM | POA: Diagnosis not present

## 2022-08-18 DIAGNOSIS — F331 Major depressive disorder, recurrent, moderate: Secondary | ICD-10-CM

## 2022-08-18 MED ORDER — SERTRALINE HCL 50 MG PO TABS: ORAL_TABLET | ORAL | 2 refills | Status: DC

## 2022-08-18 NOTE — Progress Notes (Addendum)
[facility]  Patient ID: Joel Gross,  MRN: [id number]  PCP: [name], MD  Date of Evaluation: 08/18/2022 Time spent:30 minutes  Chief Complaint:   HISTORY/CURRENT STATUS: HPI  71 year old male presents to this office for follow up and medication management. He is alert but a little unstable walking today. He is using 4-point cane to assist. . Says that he is just tired of feeling this way and needs some help. He is requesting change to his medications or adjustment.   Says his depression today is 6/10 and anxiety 5/10. Reports sleeping 7-8 hours per night.  Did admit to some experimentation with Delta 8 and gummies containing THC. He does not want a medication adjustment at this time. No mania or psychosis. No SI/HI.   Past psychiatric medication failures: Risperidone Flexeril amiltriptyline  Xanax Cymbalta Abilify Buspar Wellbutrin       Individual Medical History/ Review of Systems: Changes? :No   Allergies: Risperidone, Risperidone and related, and Hctz [hydrochlorothiazide]  Current Medications:  Current Outpatient Medications:    ALPRAZolam (XANAX) 0.25 MG tablet, Take 1-2 tablets (0.25-0.5 mg total) by mouth 2 (two) times daily as needed., Disp: 30 tablet, Rfl: 1   amLODipine (NORVASC) 10 MG tablet, Take 10 mg by mouth daily., Disp: , Rfl:    amLODipine (NORVASC) 10 MG tablet, Take by mouth., Disp: , Rfl:    atorvastatin (LIPITOR) 10 MG tablet, take 1/2 tablet by oral route  every day, Disp: , Rfl:    cariprazine (VRAYLAR) 1.5 MG capsule, Take 1 capsule (1.5 mg total) by mouth daily., Disp: 30 capsule, Rfl: 1   escitalopram (LEXAPRO) 20 MG tablet, Take 1 tablet (20 mg total) by mouth daily., Disp: 30 tablet, Rfl: 3   finasteride (PROSCAR) 5 MG tablet, Take 5 mg by mouth daily., Disp: , Rfl:    furosemide (LASIX) 20 MG tablet, Take 20 mg by mouth daily., Disp: , Rfl:    HYDROcodone-acetaminophen (NORCO) 7.5-325 MG tablet, Take 1 tablet by  mouth 3 (three) times daily., Disp: , Rfl:    lisinopril (PRINIVIL,ZESTRIL) 40 MG tablet, Take 40 mg by mouth daily., Disp: , Rfl:    lisinopril (ZESTRIL) 40 MG tablet, Take by mouth., Disp: , Rfl:    Multiple Vitamin (MULTI-VITAMIN) tablet, , Disp: , Rfl:    ofloxacin (OCUFLOX) 0.3 % ophthalmic solution, , Disp: , Rfl:    oxyCODONE-acetaminophen (PERCOCET) 7.5-325 MG tablet, Take 1 tablet by mouth 3 (three) times daily as needed., Disp: , Rfl:    Potassium Chloride ER 20 MEQ TBCR, Take 5 tablets by mouth daily., Disp: , Rfl:    potassium chloride SA (K-DUR,KLOR-CON) 20 MEQ tablet, Take 20 mEq by mouth 2 (two) times daily., Disp: , Rfl:    pravastatin (PRAVACHOL) 20 MG tablet, Take 40 mg by mouth daily., Disp: , Rfl:    prednisoLONE acetate (PRED FORTE) 1 % ophthalmic suspension, Place into the left eye., Disp: , Rfl:    pregabalin (LYRICA) 200 MG capsule, Take 1 capsule (200 mg total) by mouth 3 (three) times daily., Disp: 90 capsule, Rfl: 0   pregabalin (LYRICA) 200 MG capsule, Take by mouth., Disp: , Rfl:    rosuvastatin (CRESTOR) 5 MG tablet, Take 5 mg by mouth at bedtime as needed., Disp: , Rfl:    sertraline (ZOLOFT) 50 MG tablet, Take 1/2 tablet 25 mg for 7 days, then take one whole tablet 50 mg total daily., Disp: 30 tablet, Rfl: 2   silodosin (  RAPAFLO) 8 MG CAPS capsule, Take 1 capsule by mouth at bedtime. (Patient not taking: Reported on 08/27/2021), Disp: , Rfl:    tamsulosin (FLOMAX) 0.4 MG CAPS capsule, Take 0.4 mg by mouth daily., Disp: , Rfl:    tamsulosin (FLOMAX) 0.4 MG CAPS capsule, Take by mouth., Disp: , Rfl:    traZODone (DESYREL) 50 MG tablet, Take 1-2 tablets (50-100 mg total) by mouth at bedtime as needed., Disp: 60 tablet, Rfl: 2 Medication Side Effects: none  Family Medical/ Social History: Changes? No  MENTAL HEALTH EXAM:  There were no vitals taken for this visit.There is no height or weight on file to calculate BMI.  General Appearance: Casual, Neat, and Well  Groomed  Eye Contact:  Good  Speech:  Clear and Coherent  Volume:  Normal  Mood:  Anxious and Depressed  Affect:  Non-Congruent, Depressed, and Anxious  Thought Process:  Coherent  Orientation:  Full (Time, Place, and Person)  Thought Content: Logical   Suicidal Thoughts:  No  Homicidal Thoughts:  No  Memory:  WNL  Judgement:  Fair  Insight:  Fair  Psychomotor Activity:  Normal  Concentration:  Concentration: Good  Recall:  [place] of Knowledge: Fair  Language: Fair  Assets:  Desire for Improvement Physical Health Resilience Social Support  ADL's:  Intact  Cognition: WNL  Prognosis:  Fair    DIAGNOSES:    ICD-10-CM   1. Generalized anxiety disorder  F41.1 sertraline (ZOLOFT) 50 MG tablet    2. Major depressive disorder, recurrent episode, moderate (HCC)  F33.1 sertraline (ZOLOFT) 50 MG tablet    3. Insomnia due to other mental disorder  F51.05    F99       Receiving Psychotherapy: No    RECOMMENDATIONS:   Greater than 50% of 30  min. face to face time with patient was spent on counseling and coordination of care. Discussed him quitting his Vraylar. Said it gave him drop foot. He is requesting trial of another antidepressant.  Counseled patient on use of THC and Delta 8 possible inducing or triggering underlying psychosis. Refills escribed to pharmacy.    Reduced Lexapro to 10  mg daily for 7 days and then stop To start Zoloft 25 mg for 7 days, then 50 mg daily Continue with Trazodone 50-100 mg at bedtime daily. To continue with xanax 0.25 mg twice daily as needed for severe anxiety Pt stopped Vraylar 1.5 mg daily after breakfast and did not notify this office.  Will report any side effects or worsening symptoms To follow up in 8 weeks to reassess. Provided emergency contact information .  Elwanda Brooklyn, NP

## 2022-08-19 ENCOUNTER — Telehealth: Payer: Self-pay | Admitting: Behavioral Health

## 2022-08-19 NOTE — Telephone Encounter (Signed)
Dalbert Garnet LVM to contact her about Pt's meds. Contact @ [phone]

## 2022-08-19 NOTE — Telephone Encounter (Signed)
Called Peter Congo and answered all of her questions.

## 2022-08-22 DIAGNOSIS — Z1389 Encounter for screening for other disorder: Secondary | ICD-10-CM | POA: Diagnosis not present

## 2022-08-22 DIAGNOSIS — G629 Polyneuropathy, unspecified: Secondary | ICD-10-CM | POA: Diagnosis not present

## 2022-08-22 DIAGNOSIS — M5137 Other intervertebral disc degeneration, lumbosacral region: Secondary | ICD-10-CM | POA: Diagnosis not present

## 2022-08-22 DIAGNOSIS — G609 Hereditary and idiopathic neuropathy, unspecified: Secondary | ICD-10-CM | POA: Diagnosis not present

## 2022-08-22 DIAGNOSIS — Z79891 Long term (current) use of opiate analgesic: Secondary | ICD-10-CM | POA: Diagnosis not present

## 2022-09-10 ENCOUNTER — Telehealth: Payer: Self-pay | Admitting: Behavioral Health

## 2022-09-10 NOTE — Telephone Encounter (Signed)
Next visit is 10/17/22. Joel Gross's sister-in-law Joel Gross called to ask a questions about his new medicine and dosage. She is listed on the DPR to speak to. Her phone number is [phone].

## 2022-09-10 NOTE — Telephone Encounter (Signed)
Confirmed with SIL that patient was to take 1/2 tablet for one week and then increase to one tablet thereafter. Patient thought he was supposed to go up to 2 tablets.

## 2022-09-19 ENCOUNTER — Encounter: Payer: Self-pay | Admitting: Physician Assistant

## 2022-09-19 ENCOUNTER — Ambulatory Visit: Payer: Medicare HMO | Admitting: Physician Assistant

## 2022-09-19 VITALS — BP 157/88 | HR 73 | Resp 18 | Ht 74.0 in | Wt 227.0 lb

## 2022-09-19 DIAGNOSIS — Z1389 Encounter for screening for other disorder: Secondary | ICD-10-CM | POA: Diagnosis not present

## 2022-09-19 DIAGNOSIS — Z79891 Long term (current) use of opiate analgesic: Secondary | ICD-10-CM | POA: Diagnosis not present

## 2022-09-19 DIAGNOSIS — R413 Other amnesia: Secondary | ICD-10-CM | POA: Diagnosis not present

## 2022-09-19 DIAGNOSIS — G609 Hereditary and idiopathic neuropathy, unspecified: Secondary | ICD-10-CM | POA: Diagnosis not present

## 2022-09-19 DIAGNOSIS — G629 Polyneuropathy, unspecified: Secondary | ICD-10-CM | POA: Diagnosis not present

## 2022-09-19 DIAGNOSIS — M5137 Other intervertebral disc degeneration, lumbosacral region: Secondary | ICD-10-CM | POA: Diagnosis not present
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
## 2022-09-19 NOTE — Patient Instructions (Addendum)
It was a pleasure to see you today at our office.   Recommendations:  Neurocognitive evaluation at our office Follow up in April 30  at 11:30    Whom to call:  Memory  decline, memory medications: Call our office [phone]   For psychiatric meds, mood meds: Please have your primary care physician manage these medications.   Counseling regarding caregiver distress, including caregiver depression, anxiety and issues regarding community resources, adult day care programs, adult living facilities, or memory care questions:   Feel free to contact [place], Social Worker at [phone]   For assessment of decision of mental capacity and competency:  Call Dr. Anthoney Harada, geriatric psychiatrist at [phone]  For guidance in geriatric dementia issues please call [facility] [phone]  For guidance regarding [facility] and if placement were needed at the facility, contact Arnell Asal, Social Worker tel: [phone]  If you have any severe symptoms of a stroke, or other severe issues such as confusion,severe chills or fever, etc call 911 or go to the ER as you may need to be evaluated further   Feel free to visit Facebook page " [email or web address]" for tips of how to care for people with memory problems.      RECOMMENDATIONS FOR ALL PATIENTS WITH MEMORY PROBLEMS: 1. Continue to exercise (Recommend 30 minutes of walking everyday, or 3 hours every week) 2. Increase social interactions - continue going to [place] and enjoy social gatherings with friends and family 3. Eat healthy, avoid fried foods and eat more fruits and vegetables 4. Maintain adequate blood pressure, blood sugar, and blood cholesterol level. Reducing the risk of stroke and cardiovascular disease also helps promoting better memory. 5. Avoid stressful situations. Live a simple life and avoid aggravations. Organize your time and prepare for the next day in anticipation. 6. Sleep  well, avoid any interruptions of sleep and avoid any distractions in the bedroom that may interfere with adequate sleep quality 7. Avoid sugar, avoid sweets as there is a strong link between excessive sugar intake, diabetes, and cognitive impairment We discussed the Mediterranean diet, which has been shown to help patients reduce the risk of progressive memory disorders and reduces cardiovascular risk. This includes eating fish, eat fruits and green leafy vegetables, nuts like almonds and hazelnuts, walnuts, and also use olive oil. Avoid fast foods and fried foods as much as possible. Avoid sweets and sugar as sugar use has been linked to worsening of memory function.  There is always a concern of gradual progression of memory problems. If this is the case, then we may need to adjust level of care according to patient needs. Support, both to the patient and caregiver, should then be put into place.      You have been referred for a neuropsychological evaluation (i.e., evaluation of memory and thinking abilities). Please bring someone with you to this appointment if possible, as it is helpful for the doctor to hear from both you and another adult who knows you well. Please bring eyeglasses and hearing aids if you wear them.    The evaluation will take approximately 3 hours and has two parts:   The first part is a clinical interview with the neuropsychologist (Dr. Melvyn Novas or Dr. Nicole Kindred). During the interview, the neuropsychologist will speak with you and the individual you brought to the appointment.    The second part of the evaluation is testing with the doctor's technician Hinton Dyer or Maudie Mercury). During the testing, the technician will  ask you to remember different types of material, solve problems, and answer some questionnaires. Your family member will not be present for this portion of the evaluation.   Please note: We must reserve several hours of the neuropsychologist's time and the psychometrician's  time for your evaluation appointment. As such, there is a No-Show fee of $100. If you are unable to attend any of your appointments, please contact our office as soon as possible to reschedule.    FALL PRECAUTIONS: Be cautious when walking. Scan the area for obstacles that may increase the risk of trips and falls. When getting up in the mornings, sit up at the edge of the bed for a few minutes before getting out of bed. Consider elevating the bed at the head end to avoid drop of blood pressure when getting up. Walk always in a well-lit room (use night lights in the walls). Avoid area rugs or power cords from appliances in the middle of the walkways. Use a walker or a cane if necessary and consider physical therapy for balance exercise. Get your eyesight checked regularly.  FINANCIAL OVERSIGHT: Supervision, especially oversight when making financial decisions or transactions is also recommended.  HOME SAFETY: Consider the safety of the kitchen when operating appliances like stoves, microwave oven, and blender. Consider having supervision and share cooking responsibilities until no longer able to participate in those. Accidents with firearms and other hazards in the house should be identified and addressed as well.   ABILITY TO BE LEFT ALONE: If patient is unable to contact 911 operator, consider using LifeLine, or when the need is there, arrange for someone to stay with patients. Smoking is a fire hazard, consider supervision or cessation. Risk of wandering should be assessed by caregiver and if detected at any point, supervision and safe proof recommendations should be instituted.  MEDICATION SUPERVISION: Inability to self-administer medication needs to be constantly addressed. Implement a mechanism to ensure safe administration of the medications.   DRIVING: Regarding driving, in patients with progressive memory problems, driving will be impaired. We advise to have someone else do the driving if  trouble finding directions or if minor accidents are reported. Independent driving assessment is available to determine safety of driving.   If you are interested in the driving assessment, you can contact the following:  [employer] in [place]  [place] [phone] or [phone]    [place] refers to food and lifestyle choices that are based on the traditions of countries located on the [employer]. This way of eating has been shown to help prevent certain conditions and improve outcomes for people who have chronic diseases, like kidney disease and heart disease. What are tips for following this plan? Lifestyle  Cook and eat meals together with your family, when possible. Drink enough fluid to keep your urine clear or pale yellow. Be physically active every day. This includes: Aerobic exercise like running or swimming. Leisure activities like gardening, walking, or housework. Get 7-8 hours of sleep each night. If recommended by your health care provider, drink red wine in moderation. This means 1 glass a day for nonpregnant women and 2 glasses a day for men. A glass of wine equals 5 oz (150 mL). Reading food labels  Check the serving size of packaged foods. For foods such as rice and pasta, the serving size refers to the amount of cooked product, not dry. Check the total  fat in packaged foods. Avoid foods that have saturated fat or trans fats. Check the ingredients list for added sugars, such as corn syrup. Shopping  At the grocery store, buy most of your food from the areas near the walls of the store. This includes: Fresh fruits and vegetables (produce). Grains, beans, nuts, and seeds. Some of these may be available in unpackaged forms or large amounts (in bulk). Fresh seafood. Poultry and eggs. Low-fat dairy  products. Buy whole ingredients instead of prepackaged foods. Buy fresh fruits and vegetables in-season from local farmers markets. Buy frozen fruits and vegetables in resealable bags. If you do not have access to quality fresh seafood, buy precooked frozen shrimp or canned fish, such as tuna, salmon, or sardines. Buy small amounts of raw or cooked vegetables, salads, or olives from the deli or salad bar at your store. Stock your pantry so you always have certain foods on hand, such as olive oil, canned tuna, canned tomatoes, rice, pasta, and beans. Cooking  Cook foods with extra-virgin olive oil instead of using butter or other vegetable oils. Have meat as a side dish, and have vegetables or grains as your main dish. This means having meat in small portions or adding small amounts of meat to foods like pasta or stew. Use beans or vegetables instead of meat in common dishes like chili or lasagna. Experiment with different cooking methods. Try roasting or broiling vegetables instead of steaming or sauteing them. Add frozen vegetables to soups, stews, pasta, or rice. Add nuts or seeds for added healthy fat at each meal. You can add these to yogurt, salads, or vegetable dishes. Marinate fish or vegetables using olive oil, lemon juice, garlic, and fresh herbs. Meal planning  Plan to eat 1 vegetarian meal one day each week. Try to work up to 2 vegetarian meals, if possible. Eat seafood 2 or more times a week. Have healthy snacks readily available, such as: Vegetable sticks with hummus. Greek yogurt. Fruit and nut trail mix. Eat balanced meals throughout the week. This includes: Fruit: 2-3 servings a day Vegetables: 4-5 servings a day Low-fat dairy: 2 servings a day Fish, poultry, or lean meat: 1 serving a day Beans and legumes: 2 or more servings a week Nuts and seeds: 1-2 servings a day Whole grains: 6-8 servings a day Extra-virgin olive oil: 3-4 servings a day Limit red meat and sweets  to only a few servings a month What are my food choices? Mediterranean diet Recommended Grains: Whole-grain pasta. Brown rice. Bulgar wheat. Polenta. Couscous. Whole-wheat bread. [place]. Vegetables: Artichokes. Beets. Broccoli. Cabbage. Carrots. Eggplant. Green beans. Chard. Kale. Spinach. Onions. Leeks. Peas. Squash. Tomatoes. Peppers. Radishes. Fruits: Apples. Apricots. Avocado. Berries. Bananas. Cherries. Dates. Figs. Grapes. Lemons. Melon. Oranges. Peaches. Plums. Pomegranate. Meats and other protein foods: Beans. Almonds. Sunflower seeds. Pine nuts. Peanuts. [place]. Salmon. Scallops. Shrimp. [place]. Tilapia. Clams. Oysters. Eggs. Dairy: Low-fat milk. Cheese. Greek yogurt. Beverages: Water. Red wine. Herbal tea. Fats and oils: Extra virgin olive oil. Avocado oil. Grape seed oil. Sweets and desserts: [place] yogurt with honey. Baked apples. Poached pears. Trail mix. Seasoning and other foods: Basil. Cilantro. Coriander. Cumin. Mint. Parsley. Sage. Rosemary. Tarragon. Garlic. Oregano. Thyme. Pepper. Balsalmic vinegar. Tahini. Hummus. Tomato sauce. Olives. Mushrooms. Limit these Grains: Prepackaged pasta or rice dishes. Prepackaged cereal with added sugar. Vegetables: Deep fried potatoes (french fries). Fruits: Fruit canned in syrup. Meats and other protein foods: Beef. Pork. Lamb. Poultry with skin. Hot dogs. [place] Salines. Dairy: Ice cream. Sour cream. Whole  milk. Beverages: Juice. Sugar-sweetened soft drinks. Beer. Liquor and spirits. Fats and oils: Butter. Canola oil. Vegetable oil. Beef fat (tallow). Lard. Sweets and desserts: Cookies. Cakes. Pies. Candy. Seasoning and other foods: Mayonnaise. Premade sauces and marinades. The items listed may not be a complete list. Talk with your dietitian about what dietary choices are right for you. Summary The Mediterranean diet includes both food and lifestyle choices. Eat a variety of fresh fruits and vegetables, beans, nuts, seeds, and whole  grains. Limit the amount of red meat and sweets that you eat. Talk with your health care provider about whether it is safe for you to drink red wine in moderation. This means 1 glass a day for nonpregnant women and 2 glasses a day for men. A glass of wine equals 5 oz (150 mL). This information is not intended to replace advice given to you by your health care provider. Make sure you discuss any questions you have with your health care provider. Document Released: 06/12/2016 Document Revised: 07/15/2016 Document Reviewed: 06/12/2016 Elsevier Interactive Patient Education  2017 [employer].   We have sent a referral to [facility] for your CT and they will call you directly to schedule your appointment. They are located at [place]. If you need to contact them directly please call [phone].  Your provider has requested that you have labwork completed today. Please go to [facility] ([address]) on the second floor of this building before leaving the office today. You do not need to check in. If you are not called within 15 minutes please check with the front desk.

## 2022-10-01 ENCOUNTER — Other Ambulatory Visit: Payer: Self-pay | Admitting: Behavioral Health

## 2022-10-01 DIAGNOSIS — F411 Generalized anxiety disorder: Secondary | ICD-10-CM

## 2022-10-03 DIAGNOSIS — R413 Other amnesia: Secondary | ICD-10-CM | POA: Diagnosis not present

## 2022-10-03 DIAGNOSIS — R69 Illness, unspecified: Secondary | ICD-10-CM | POA: Diagnosis not present

## 2022-10-03 DIAGNOSIS — Z6829 Body mass index (BMI) 29.0-29.9, adult: Secondary | ICD-10-CM | POA: Diagnosis not present

## 2022-10-03 DIAGNOSIS — K5900 Constipation, unspecified: Secondary | ICD-10-CM | POA: Diagnosis not present

## 2022-10-15 NOTE — Telephone Encounter (Signed)
After you see pt on Friday let me know if pt is on Vraylar. We can do patient assistance but not sure he followed up with me/us.

## 2022-10-16 DIAGNOSIS — G609 Hereditary and idiopathic neuropathy, unspecified: Secondary | ICD-10-CM | POA: Diagnosis not present

## 2022-10-16 DIAGNOSIS — Z79891 Long term (current) use of opiate analgesic: Secondary | ICD-10-CM | POA: Diagnosis not present

## 2022-10-16 DIAGNOSIS — M5137 Other intervertebral disc degeneration, lumbosacral region: Secondary | ICD-10-CM | POA: Diagnosis not present

## 2022-10-16 DIAGNOSIS — G629 Polyneuropathy, unspecified: Secondary | ICD-10-CM | POA: Diagnosis not present

## 2022-10-16 DIAGNOSIS — Z1389 Encounter for screening for other disorder: Secondary | ICD-10-CM | POA: Diagnosis not present

## 2022-10-17 ENCOUNTER — Encounter: Payer: Self-pay | Admitting: Behavioral Health

## 2022-10-17 ENCOUNTER — Ambulatory Visit: Payer: Medicare HMO | Admitting: Behavioral Health

## 2022-10-17 DIAGNOSIS — F5105 Insomnia due to other mental disorder: Secondary | ICD-10-CM

## 2022-10-17 DIAGNOSIS — F411 Generalized anxiety disorder: Secondary | ICD-10-CM | POA: Diagnosis not present

## 2022-10-17 DIAGNOSIS — F331 Major depressive disorder, recurrent, moderate: Secondary | ICD-10-CM | POA: Diagnosis not present

## 2022-10-17 DIAGNOSIS — F99 Mental disorder, not otherwise specified: Secondary | ICD-10-CM | POA: Diagnosis not present

## 2022-10-17 DIAGNOSIS — R69 Illness, unspecified: Secondary | ICD-10-CM | POA: Diagnosis not present

## 2022-10-17 MED ORDER — SERTRALINE HCL 100 MG PO TABS: 100.0000 mg | ORAL_TABLET | Freq: Every day | ORAL | 1 refills | Status: DC

## 2022-10-17 MED ORDER — TRAZODONE HCL 50 MG PO TABS: 50.0000 mg | ORAL_TABLET | Freq: Every evening | ORAL | 3 refills | Status: DC | PRN

## 2022-10-17 MED ORDER — ALPRAZOLAM 0.25 MG PO TABS: ORAL_TABLET | ORAL | 3 refills | Status: DC

## 2022-10-17 NOTE — Progress Notes (Signed)
[facility]  Patient ID: Joel Gross,  MRN: [id number]  PCP: [name], MD  Date of Evaluation: 10/17/2022 Time spent:30 minutes  Chief Complaint:  Chief Complaint   Anxiety; Depression; Follow-up; Medication Refill; Family Problem; Patient Education     HISTORY/CURRENT STATUS: HPI  71 year old male presents to this office for follow up and medication management. Not using cane today. Still bothered by foot pain. Feels like the Zoloft is helping some. He feels like he could use a dosage increase today. Says that he has been praying a lot recently and listening to sermons. Says that he has been reflecting on his life and wanting to improve. He has recently tried to make amends with his family for his wrongs.  Says his depression today is 3/10 and anxiety 3/10. Reports sleeping 7-8 hours per night.  Has not used Delta 8 recently.  He does not want a medication adjustment at this time. No mania or psychosis. No SI/HI.   Past psychiatric medication failures: Risperidone Flexeril amiltriptyline  Xanax Cymbalta Abilify Buspar Wellbutrin   Individual Medical History/ Review of Systems: Changes? :No   Allergies: Risperidone, Risperidone and related, and Hctz [hydrochlorothiazide]  Current Medications:  Current Outpatient Medications:    sertraline (ZOLOFT) 100 MG tablet, Take 1 tablet (100 mg total) by mouth daily., Disp: 90 tablet, Rfl: 1   ALPRAZolam (XANAX) 0.25 MG tablet, TAKE 1 TO 2 TABLETS BY MOUTH TWICE DAILY AS NEEDED, Disp: 30 tablet, Rfl: 3   amLODipine (NORVASC) 10 MG tablet, Take 10 mg by mouth daily., Disp: , Rfl:    amLODipine (NORVASC) 10 MG tablet, Take by mouth., Disp: , Rfl:    atorvastatin (LIPITOR) 10 MG tablet, take 1/2 tablet by oral route  every day, Disp: , Rfl:    cariprazine (VRAYLAR) 1.5 MG capsule, Take 1 capsule (1.5 mg total) by mouth daily., Disp: 30 capsule, Rfl: 1   finasteride (PROSCAR) 5 MG tablet, Take 5 mg by mouth  daily., Disp: , Rfl:    furosemide (LASIX) 20 MG tablet, Take 20 mg by mouth daily., Disp: , Rfl:    HYDROcodone-acetaminophen (NORCO) 7.5-325 MG tablet, Take 1 tablet by mouth 3 (three) times daily., Disp: , Rfl:    lisinopril (PRINIVIL,ZESTRIL) 40 MG tablet, Take 40 mg by mouth daily., Disp: , Rfl:    lisinopril (ZESTRIL) 40 MG tablet, Take by mouth., Disp: , Rfl:    Multiple Vitamin (MULTI-VITAMIN) tablet, , Disp: , Rfl:    ofloxacin (OCUFLOX) 0.3 % ophthalmic solution, , Disp: , Rfl:    oxyCODONE-acetaminophen (PERCOCET) 7.5-325 MG tablet, Take 1 tablet by mouth 3 (three) times daily as needed., Disp: , Rfl:    Potassium Chloride ER 20 MEQ TBCR, Take 5 tablets by mouth daily., Disp: , Rfl:    potassium chloride SA (K-DUR,KLOR-CON) 20 MEQ tablet, Take 20 mEq by mouth 2 (two) times daily., Disp: , Rfl:    pravastatin (PRAVACHOL) 20 MG tablet, Take 40 mg by mouth daily., Disp: , Rfl:    prednisoLONE acetate (PRED FORTE) 1 % ophthalmic suspension, Place into the left eye., Disp: , Rfl:    pregabalin (LYRICA) 200 MG capsule, Take 1 capsule (200 mg total) by mouth 3 (three) times daily., Disp: 90 capsule, Rfl: 0   pregabalin (LYRICA) 200 MG capsule, Take by mouth., Disp: , Rfl:    rosuvastatin (CRESTOR) 5 MG tablet, Take 5 mg by mouth at bedtime as needed., Disp: , Rfl:    sertraline (ZOLOFT)  50 MG tablet, Take 1/2 tablet 25 mg for 7 days, then take one whole tablet 50 mg total daily., Disp: 30 tablet, Rfl: 2   silodosin (RAPAFLO) 8 MG CAPS capsule, Take 1 capsule by mouth at bedtime., Disp: , Rfl:    tamsulosin (FLOMAX) 0.4 MG CAPS capsule, Take 0.4 mg by mouth daily., Disp: , Rfl:    tamsulosin (FLOMAX) 0.4 MG CAPS capsule, Take by mouth., Disp: , Rfl:    traZODone (DESYREL) 50 MG tablet, Take 1-2 tablets (50-100 mg total) by mouth at bedtime as needed., Disp: 60 tablet, Rfl: 3 Medication Side Effects: none  Family Medical/ Social History: Changes? No  MENTAL HEALTH EXAM:  There were no  vitals taken for this visit.There is no height or weight on file to calculate BMI.  General Appearance: Casual and Neat  Eye Contact:  Good  Speech:  Clear and Coherent  Volume:  Normal  Mood:  Anxious, Depressed, and Dysphoric  Affect:  Appropriate  Thought Process:  Coherent  Orientation:  Full (Time, Place, and Person)  Thought Content: Logical   Suicidal Thoughts:  No  Homicidal Thoughts:  No  Memory:  WNL  Judgement:  Good  Insight:  Good  Psychomotor Activity:  Normal  Concentration:  Concentration: Good  Recall:  Good  Fund of Knowledge: Good  Language: Good  Assets:  Desire for Improvement  ADL's:  Intact  Cognition: WNL  Prognosis:  Fair    DIAGNOSES:    ICD-10-CM   1. Generalized anxiety disorder  F41.1 sertraline (ZOLOFT) 100 MG tablet    ALPRAZolam (XANAX) 0.25 MG tablet    2. Major depressive disorder, recurrent episode, moderate (HCC)  F33.1 sertraline (ZOLOFT) 100 MG tablet    3. Insomnia due to other mental disorder  F51.05 traZODone (DESYREL) 50 MG tablet   F99       Receiving Psychotherapy: No    RECOMMENDATIONS:   Greater than 50% of 30  min. face to face time with patient was spent on counseling and coordination of care. We discussed his moderate improvement with Zoloft. He is requesting increase this visit. We talked about family dynamics and sources of anxiety and pain.   Refills escribed to pharmacy.   To increase Zoloft to 100 mg daily.  Continue with Trazodone 50-100 mg at bedtime daily. To continue with xanax 0.25 mg twice daily as needed for severe anxiety Will report any side effects or worsening symptoms To follow up in 8 weeks to reassess. Provided emergency contact information . Discussed potential benefits, risk, and side effects of benzodiazepines to include potential risk of tolerance and dependence, as well as possible drowsiness.  Advised patient not to drive if experiencing drowsiness and to take lowest possible effective dose  to minimize risk of dependence and tolerance.  Reviewed PDMP  Elwanda [place], NP

## 2022-10-21 DIAGNOSIS — K581 Irritable bowel syndrome with constipation: Secondary | ICD-10-CM | POA: Diagnosis not present

## 2022-11-03 ENCOUNTER — Other Ambulatory Visit: Payer: Self-pay | Admitting: Behavioral Health

## 2022-11-03 DIAGNOSIS — F331 Major depressive disorder, recurrent, moderate: Secondary | ICD-10-CM

## 2022-11-03 DIAGNOSIS — F411 Generalized anxiety disorder: Secondary | ICD-10-CM

## 2022-11-14 DIAGNOSIS — Z79899 Other long term (current) drug therapy: Secondary | ICD-10-CM | POA: Diagnosis not present

## 2022-11-14 DIAGNOSIS — Z6829 Body mass index (BMI) 29.0-29.9, adult: Secondary | ICD-10-CM | POA: Diagnosis not present

## 2022-11-14 DIAGNOSIS — Z0000 Encounter for general adult medical examination without abnormal findings: Secondary | ICD-10-CM | POA: Diagnosis not present

## 2022-11-20 DIAGNOSIS — G629 Polyneuropathy, unspecified: Secondary | ICD-10-CM | POA: Diagnosis not present

## 2022-11-20 DIAGNOSIS — G609 Hereditary and idiopathic neuropathy, unspecified: Secondary | ICD-10-CM | POA: Diagnosis not present

## 2022-11-20 DIAGNOSIS — Z1389 Encounter for screening for other disorder: Secondary | ICD-10-CM | POA: Diagnosis not present

## 2022-11-20 DIAGNOSIS — M5137 Other intervertebral disc degeneration, lumbosacral region: Secondary | ICD-10-CM | POA: Diagnosis not present

## 2022-11-20 DIAGNOSIS — Z79891 Long term (current) use of opiate analgesic: Secondary | ICD-10-CM | POA: Diagnosis not present

## 2022-12-08 ENCOUNTER — Ambulatory Visit: Payer: Medicare HMO | Admitting: Psychology

## 2022-12-08 ENCOUNTER — Encounter: Payer: Self-pay | Admitting: Psychology

## 2022-12-08 ENCOUNTER — Ambulatory Visit: Payer: Medicare HMO

## 2022-12-08 DIAGNOSIS — T50902A Poisoning by unspecified drugs, medicaments and biological substances, intentional self-harm, initial encounter: Secondary | ICD-10-CM | POA: Insufficient documentation

## 2022-12-08 DIAGNOSIS — F333 Major depressive disorder, recurrent, severe with psychotic symptoms: Secondary | ICD-10-CM | POA: Diagnosis not present

## 2022-12-08 DIAGNOSIS — F09 Unspecified mental disorder due to known physiological condition: Secondary | ICD-10-CM

## 2022-12-08 DIAGNOSIS — R69 Illness, unspecified: Secondary | ICD-10-CM | POA: Diagnosis not present

## 2022-12-08 DIAGNOSIS — R4189 Other symptoms and signs involving cognitive functions and awareness: Secondary | ICD-10-CM

## 2022-12-08 DIAGNOSIS — F411 Generalized anxiety disorder: Secondary | ICD-10-CM

## 2022-12-08 NOTE — Progress Notes (Signed)
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 the moderate range, while symptoms of depression were within the severe range. A screening instrument assessing recent sleep quality suggested the presence of minimal sleep dysfunction.  Tables of Scores:   Note: This summary of test scores accompanies the interpretive report and should not be considered in isolation without reference to the appropriate sections in the text. Descriptors are based on appropriate normative data and may be adjusted based on clinical judgment. Terms such as "Within Normal Limits" and "Outside Normal Limits" are used when a more specific description of the test score cannot be determined.       Percentile - Normative Descriptor > 98 - Exceptionally High 91-97 - Well Above Average 75-90 - Above Average 25-74 - Average 9-24 - Below Average 2-8 - Well Below  Average < 2 - Exceptionally Low       Validity:   DESCRIPTOR       Dot Counting Test: --- --- Outside Normal Limits  Rey 15:       Free Recall --- --- Within Normal Limits    Combined Score --- --- Within Normal Limits  RBANS Effort Index: --- --- Within Normal Limits  WAIS-IV Reliable Digit Span: --- --- Within Normal Limits  D-KEFS Color Word Effort Index: --- --- Outside Normal Limits       Orientation:      Raw Score Percentile   NAB Orientation, Form 1 27/29 --- ---       Cognitive Screening:      Raw Score Percentile   SLUMS: 19/30 --- ---       RBANS, Form A: Standard Score/ Scaled Score Percentile   Total Score 67 1 Exceptionally Low  Immediate Memory 58 2 Well Below Average    List Learning 4 2 Well Below Average    Story Memory 5 5 Well Below Average  Visuospatial/Constructional 66 1 Exceptionally Low    Figure Copy 2 <1 Exceptionally Low    Line Orientation 13/20 10-16 Below Average  Language 82 12 Below Average    Picture Naming 10/10 51-75 Average    Semantic Fluency 3 1 Exceptionally Low  Attention 74 2 Well Below Average    Digit Span 5 5 Well Below Average    Coding 5 5 Well Below Average  Delayed Memory 85 16 Below Average    List Recall 0/10 <2 Exceptionally Low    List Recognition 19/20 26-50 Average    Story Recall 5 5 Well Below Average    Story Recognition 9/12 16-26 Below Average    Figure Recall 6 9 Below Average    Figure Recognition 4/8 9-20 Below Average        Intellectual Functioning:      Standard Score Percentile   Test of Premorbid Functioning: 97 42 Average  Attention/Executive Function:     Trail Making Test (TMT): Raw Score (T Score) Percentile     Part A 55 secs.,  0 errors (35) 7 Well Below Average    Part B 161 secs.,  4 errors (35) 7 Well Below Average         Scaled Score Percentile   WAIS-IV Digit Span: 6 9 Below Average    Forward 8 25 Average    Backward 8 25 Average    Sequencing 5 5 Well Below Average         Scaled Score Percentile   WAIS-IV Similarities: 12 75 Above Average       D-KEFS Color-Word Interference Test: Raw Score (Scaled Score) Percentile     Color Naming 49 secs. (4) 2 Well Below Average    Word Reading 39 secs. (3) 1 Exceptionally Low    Inhibition 153 secs. (1) <1 Exceptionally Low      Total Errors 8 errors (5) 5 Well Below Average    Inhibition/Switching 114 secs. (5) 5 Well Below Average      Total Errors 15 errors (1) <1 Exceptionally Low       D-KEFS Verbal Fluency Test: Raw Score (Scaled Score) Percentile     Letter Total Correct 33 (9) 37 Average    Category Total Correct 19 (4) 2 Well Below Average    Category Switching Total Correct 8 (5) 5 Well Below Average    Category Switching Accuracy 5 (4) 2 Well Below Average      Total Set Loss Errors 8 (3) 1 Exceptionally Low      Total Repetition Errors 1 (12) 75 Above Average       NAB Executive Functions Module, Form 1: T Score Percentile     Judgment 56 73 Average       Language:     Verbal Fluency Test: Raw Score (T Score) Percentile     Phonemic Fluency (FAS) 33 (43) 25 Average    Animal Fluency 16 (45) 31 Average        NAB Language Module, Form 1: T Score Percentile     Auditory Comprehension 44 27 Average    Naming 30/31 (57) 75 Above Average       Visuospatial/Visuoconstruction:      Raw Score Percentile   Clock Drawing: 6/10 --- Impaired       NAB Spatial Module, Form 1: T Score Percentile     Visual Discrimination 44 27 Average        Scaled Score Percentile   WAIS-IV Block Design: 2 <1 Exceptionally Low       Mood and Personality:      Raw Score Percentile   Beck Depression Inventory - II: 30 --- Severe  PROMIS Anxiety Questionnaire: 26 --- Moderate       Additional Questionnaires:      Raw Score Percentile   PROMIS Sleep Disturbance Questionnaire: 23 --- None to Slight   Informed Consent and Coding/Compliance:   The current evaluation represents a clinical evaluation for the  purposes previously outlined by the referral source and is in no way reflective of a forensic evaluation.   Mr. Molesworth was provided with a verbal description of the nature and purpose of the present neuropsychological evaluation. Also reviewed were the foreseeable risks and/or discomforts and benefits of the procedure, limits of confidentiality, and mandatory reporting requirements of this provider. The patient was given the opportunity to ask questions and receive answers about the evaluation. Oral consent  to participate was provided by the patient.   This evaluation was conducted by Christia Reading, Ph.D., ABPP-CN, board certified clinical neuropsychologist. Mr. Kroh completed a clinical interview with Dr. Melvyn Novas, billed as one unit [phone], and 140 minutes of cognitive testing and scoring, billed as one unit [phone] and four additional units [id number]. Psychometrist Cruzita Lederer, B.S., assisted Dr. Melvyn Novas with test administration and scoring procedures. As a separate and discrete service, Dr. Melvyn Novas spent a total of 160 minutes in interpretation and report writing billed as one unit [phone] and two units [id number].

## 2022-12-08 NOTE — Progress Notes (Signed)
   Psychometrician Note   Cognitive testing was administered to Joel Gross by Cruzita Lederer, B.S. (psychometrist) under the supervision of Dr. Christia Reading, Ph.D., licensed psychologist on 12/08/2022. Joel Gross did not appear overtly distressed by the testing session per behavioral observation or responses across self-report questionnaires. Rest breaks were offered.    The battery of tests administered was selected by Dr. Christia Reading, Ph.D. with consideration to Joel Gross current level of functioning, the nature of his symptoms, emotional and behavioral responses during interview, level of literacy, observed level of motivation/effort, and the nature of the referral question. This battery was communicated to the psychometrist. Communication between Dr. Christia Reading, Ph.D. and the psychometrist was ongoing throughout the evaluation and Dr. Christia Reading, Ph.D. was immediately accessible at all times. Dr. Christia Reading, Ph.D. provided supervision to the psychometrist on the date of this service to the extent necessary to assure the quality of all services provided.    Joel Gross will return within approximately 1-2 weeks for an interactive feedback session with Dr. Melvyn Novas at which time his test performances, clinical impressions, and treatment recommendations will be reviewed in detail. Joel Gross understands he can contact our office should he require our assistance before this time.  A total of 140 minutes of billable time were spent face-to-face with Joel Gross by the psychometrist. This includes both test administration and scoring time. Billing for these services is reflected in the clinical report generated by Dr. Christia Reading, Ph.D.  This note reflects time spent with the psychometrician and does not include test scores or any clinical interpretations made by Dr. Melvyn Novas. The full report will follow in a separate note.

## 2022-12-16 ENCOUNTER — Ambulatory Visit: Payer: Medicare HMO | Admitting: Psychology

## 2022-12-16 DIAGNOSIS — F09 Unspecified mental disorder due to known physiological condition: Secondary | ICD-10-CM | POA: Diagnosis not present

## 2022-12-16 DIAGNOSIS — F411 Generalized anxiety disorder: Secondary | ICD-10-CM

## 2022-12-16 DIAGNOSIS — R69 Illness, unspecified: Secondary | ICD-10-CM | POA: Diagnosis not present

## 2022-12-16 DIAGNOSIS — F333 Major depressive disorder, recurrent, severe with psychotic symptoms: Secondary | ICD-10-CM

## 2022-12-16 NOTE — Progress Notes (Signed)
   Neuropsychology Feedback Session Joel Gross. [facility]  Reason for Referral:   Joel Gross is a 72 y.o. right-handed Caucasian male referred by Sharene Butters, PA-C, to characterize his current cognitive functioning and assist with diagnostic clarity and treatment planning in the context of subjective cognitive decline and severe psychiatric distress with current passive suicidal ideation.   Feedback:   Joel Gross completed a comprehensive neuropsychological evaluation on 12/08/2022. Please refer to that encounter for the full report and recommendations. Briefly, results suggested primary impairments surrounding processing speed, certain aspects of executive functioning (i.e., cognitive flexibility, response inhibition), and encoding (i.e., learning) aspects of memory. Additional performance variability was exhibited across attention/concentration, semantic fluency, visuospatial abilities, and retrieval/consolidation aspects of memory. During interview, Joel Gross described quite severe psychiatric distress, including passive suicidal ideation "every other day" (without current plan or intent to act) and psychotic symptoms (i.e., visual hallucinations) likely related to ongoing depressive symptoms. Responses across mood-related questionnaires were consistent with this reporting, also suggesting moderate to severe symptoms of anxiety and depression during the past 1-2 weeks respectively. It is necessary to highlight that psychiatric distress, especially at this level of severity, can certainly compromise cognitive and functional abilities. Most commonly impacted cognitive domains include processing speed, attention/concentration, executive functioning, and learning and memory. 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   Joel Gross was accompanied by his sister-in-law during the current feedback session. Content of the current session focused on the results of his neuropsychological evaluation. Joel Gross was given the opportunity to ask questions and his questions were answered. He was encouraged to reach out should additional questions arise. A copy of his report was provided at the conclusion of the visit.      30 minutes were spent conducting the current feedback session with Joel Gross, billed as one unit [phone].

## 2022-12-19 ENCOUNTER — Encounter: Payer: Self-pay | Admitting: Behavioral Health

## 2022-12-19 ENCOUNTER — Ambulatory Visit (INDEPENDENT_AMBULATORY_CARE_PROVIDER_SITE_OTHER): Payer: Medicare HMO | Admitting: Behavioral Health

## 2022-12-19 DIAGNOSIS — F331 Major depressive disorder, recurrent, moderate: Secondary | ICD-10-CM

## 2022-12-19 DIAGNOSIS — F411 Generalized anxiety disorder: Secondary | ICD-10-CM

## 2022-12-19 DIAGNOSIS — F99 Mental disorder, not otherwise specified: Secondary | ICD-10-CM

## 2022-12-19 DIAGNOSIS — F5105 Insomnia due to other mental disorder: Secondary | ICD-10-CM

## 2022-12-19 DIAGNOSIS — R69 Illness, unspecified: Secondary | ICD-10-CM | POA: Diagnosis not present

## 2022-12-19 MED ORDER — SERTRALINE HCL 100 MG PO TABS: 150.0000 mg | ORAL_TABLET | Freq: Every day | ORAL | 3 refills | Status: DC

## 2022-12-19 MED ORDER — ALPRAZOLAM 0.25 MG PO TABS: ORAL_TABLET | ORAL | 3 refills | Status: DC

## 2022-12-19 MED ORDER — TRAZODONE HCL 50 MG PO TABS: 50.0000 mg | ORAL_TABLET | Freq: Every evening | ORAL | 3 refills | Status: DC | PRN

## 2022-12-19 NOTE — Progress Notes (Signed)
[facility]  Patient ID: Joel Gross,  MRN: [id number]  PCP: [name], MD  Date of Evaluation: 12/19/2022 Time spent:30 minutes  Chief Complaint:  Chief Complaint   Depression; Anxiety; Medication Refill; Patient Education; Medication Problem; Follow-up     HISTORY/CURRENT STATUS: HPI  72 year old male presents to this office for follow up and medication management. Using rollator to assist with ambulation. Sister-in-law is present with his consent. Still bothered by foot pain. Feels like the Zoloft is helping some but not enough. 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 To rule out lewy body. To continue to follow up with neurology. He feels like he could use a dosage increase today of Zoloft. Was unable to take Vraylar due to increase in tremors and medication was too expensive. Says his depression today is 4/10 and anxiety 3/10. Reports sleeping 7-8 hours per night.  Has not used Delta 8 recently.  No mania or psychosis. No SI/HI.   Past psychiatric medication failures: Risperidone Flexeril amiltriptyline  Xanax Cymbalta Abilify Buspar Wellbutrin Vraylar-cost       Individual Medical History/ Review of Systems: Changes? :No   Allergies: Risperidone, Risperidone and related, and Hctz [hydrochlorothiazide]  Current Medications:  Current Outpatient Medications:    ALPRAZolam (XANAX) 0.25 MG tablet, TAKE 1 TO 2 TABLETS BY MOUTH TWICE DAILY AS NEEDED, Disp: 60 tablet, Rfl: 3   amLODipine (NORVASC) 10 MG tablet, Take 10 mg by mouth daily., Disp: , Rfl:    amLODipine (NORVASC) 10 MG tablet, Take by mouth., Disp: , Rfl:    atorvastatin (LIPITOR) 10 MG tablet, take 1/2 tablet by oral route  every day, Disp: , Rfl:    finasteride (PROSCAR) 5 MG tablet, Take 5 mg by mouth daily., Disp: , Rfl:    furosemide (LASIX) 20 MG tablet, Take 20 mg by mouth daily., Disp: , Rfl:    HYDROcodone-acetaminophen (NORCO) 7.5-325 MG tablet, Take 1  tablet by mouth 3 (three) times daily., Disp: , Rfl:    lisinopril (PRINIVIL,ZESTRIL) 40 MG tablet, Take 40 mg by mouth daily., Disp: , Rfl:    lisinopril (ZESTRIL) 40 MG tablet, Take by mouth., Disp: , Rfl:    Multiple Vitamin (MULTI-VITAMIN) tablet, , Disp: , Rfl:    ofloxacin (OCUFLOX) 0.3 % ophthalmic solution, , Disp: , Rfl:    oxyCODONE-acetaminophen (PERCOCET) 7.5-325 MG tablet, Take 1 tablet by mouth 3 (three) times daily as needed., Disp: , Rfl:    Potassium Chloride ER 20 MEQ TBCR, Take 5 tablets by mouth daily., Disp: , Rfl:    potassium chloride SA (K-DUR,KLOR-CON) 20 MEQ tablet, Take 20 mEq by mouth 2 (two) times daily., Disp: , Rfl:    pravastatin (PRAVACHOL) 20 MG tablet, Take 40 mg by mouth daily., Disp: , Rfl:    prednisoLONE acetate (PRED FORTE) 1 % ophthalmic suspension, Place into the left eye., Disp: , Rfl:    pregabalin (LYRICA) 200 MG capsule, Take 1 capsule (200 mg total) by mouth 3 (three) times daily., Disp: 90 capsule, Rfl: 0   pregabalin (LYRICA) 200 MG capsule, Take by mouth., Disp: , Rfl:    rosuvastatin (CRESTOR) 5 MG tablet, Take 5 mg by mouth at bedtime as needed., Disp: , Rfl:    sertraline (ZOLOFT) 100 MG tablet, Take 1.5 tablets (150 mg total) by mouth daily., Disp: 45 tablet, Rfl: 3   silodosin (RAPAFLO) 8 MG CAPS capsule, Take 1 capsule by mouth at bedtime., Disp: , Rfl:  tamsulosin (FLOMAX) 0.4 MG CAPS capsule, Take 0.4 mg by mouth daily., Disp: , Rfl:    tamsulosin (FLOMAX) 0.4 MG CAPS capsule, Take by mouth., Disp: , Rfl:    traZODone (DESYREL) 50 MG tablet, Take 1-2 tablets (50-100 mg total) by mouth at bedtime as needed., Disp: 60 tablet, Rfl: 3 Medication Side Effects: none  Family Medical/ Social History: Changes? No  MENTAL HEALTH EXAM:  There were no vitals taken for this visit.There is no height or weight on file to calculate BMI.  General Appearance: Casual, Neat, and Well Groomed  Eye Contact:  Good  Speech:  Clear and Coherent  Volume:   Normal  Mood:  Anxious, Depressed, and Dysphoric  Affect:  Appropriate  Thought Process:  Coherent  Orientation:  Full (Time, Place, and Person)  Thought Content: Logical   Suicidal Thoughts:  No  Homicidal Thoughts:  No  Memory:  WNL  Judgement:  Good  Insight:  Good  Psychomotor Activity:  Normal  Concentration:  Concentration: Good  Recall:  Good  Fund of Knowledge: Good  Language: Good  Assets:  Desire for Improvement  ADL's:  Intact  Cognition: WNL  Prognosis:  Good    DIAGNOSES:    ICD-10-CM   1. Generalized anxiety disorder  F41.1 sertraline (ZOLOFT) 100 MG tablet    ALPRAZolam (XANAX) 0.25 MG tablet    2. Major depressive disorder, recurrent episode, moderate (HCC)  F33.1 sertraline (ZOLOFT) 100 MG tablet    3. Insomnia due to other mental disorder  F51.05 traZODone (DESYREL) 50 MG tablet   F99       Receiving Psychotherapy: No    RECOMMENDATIONS:   Greater than 50% of 30  min. face to face time with patient was spent on counseling and coordination of care. We discussed his neuro testing. We agreed to try to get his anxiety and depression under control. 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 He is requesting increase of Zoloft this visit. We talked about family dynamics and sources of anxiety and pain.   Refills escribed to pharmacy.    To increase Zoloft to 150 mg daily.  Continue with Trazodone 50-100 mg at bedtime daily. To continue with xanax 0.25 mg twice daily as needed for severe anxiety Will report any side effects or worsening symptoms To follow up in 8 weeks to reassess. Provided emergency contact information . Discussed potential benefits, risk, and side effects of benzodiazepines to include potential risk of tolerance and dependence, as well as possible drowsiness.  Advised patient not to drive if experiencing drowsiness and to take lowest possible effective dose to minimize risk of dependence and tolerance.   Reviewed PDMP   Elwanda Brooklyn, NP

## 2022-12-24 DIAGNOSIS — Z1389 Encounter for screening for other disorder: Secondary | ICD-10-CM | POA: Diagnosis not present

## 2022-12-24 DIAGNOSIS — Z79891 Long term (current) use of opiate analgesic: Secondary | ICD-10-CM | POA: Diagnosis not present

## 2022-12-24 DIAGNOSIS — G629 Polyneuropathy, unspecified: Secondary | ICD-10-CM | POA: Diagnosis not present

## 2022-12-24 DIAGNOSIS — M5137 Other intervertebral disc degeneration, lumbosacral region: Secondary | ICD-10-CM | POA: Diagnosis not present

## 2022-12-24 DIAGNOSIS — G609 Hereditary and idiopathic neuropathy, unspecified: Secondary | ICD-10-CM | POA: Diagnosis not present

## 2022-12-31 DIAGNOSIS — R351 Nocturia: Secondary | ICD-10-CM | POA: Diagnosis not present

## 2022-12-31 DIAGNOSIS — R8279 Other abnormal findings on microbiological examination of urine: Secondary | ICD-10-CM | POA: Diagnosis not present

## 2022-12-31 DIAGNOSIS — R3915 Urgency of urination: Secondary | ICD-10-CM | POA: Diagnosis not present

## 2022-12-31 DIAGNOSIS — N401 Enlarged prostate with lower urinary tract symptoms: Secondary | ICD-10-CM | POA: Diagnosis not present

## 2023-01-20 DIAGNOSIS — G894 Chronic pain syndrome: Secondary | ICD-10-CM | POA: Diagnosis not present

## 2023-01-20 DIAGNOSIS — M5137 Other intervertebral disc degeneration, lumbosacral region: Secondary | ICD-10-CM | POA: Diagnosis not present

## 2023-01-20 DIAGNOSIS — G629 Polyneuropathy, unspecified: Secondary | ICD-10-CM | POA: Diagnosis not present

## 2023-01-20 DIAGNOSIS — Z1389 Encounter for screening for other disorder: Secondary | ICD-10-CM | POA: Diagnosis not present

## 2023-01-20 DIAGNOSIS — G609 Hereditary and idiopathic neuropathy, unspecified: Secondary | ICD-10-CM | POA: Diagnosis not present

## 2023-01-20 DIAGNOSIS — Z79891 Long term (current) use of opiate analgesic: Secondary | ICD-10-CM | POA: Diagnosis not present

## 2023-01-28 DIAGNOSIS — R972 Elevated prostate specific antigen [PSA]: Secondary | ICD-10-CM | POA: Diagnosis not present

## 2023-01-28 DIAGNOSIS — R3914 Feeling of incomplete bladder emptying: Secondary | ICD-10-CM | POA: Diagnosis not present

## 2023-01-28 DIAGNOSIS — N401 Enlarged prostate with lower urinary tract symptoms: Secondary | ICD-10-CM | POA: Diagnosis not present

## 2023-01-28 DIAGNOSIS — R3915 Urgency of urination: Secondary | ICD-10-CM | POA: Diagnosis not present

## 2023-01-28 DIAGNOSIS — R351 Nocturia: Secondary | ICD-10-CM | POA: Diagnosis not present

## 2023-01-29 ENCOUNTER — Ambulatory Visit: Payer: Medicare HMO | Admitting: Behavioral Health

## 2023-02-01 DIAGNOSIS — I10 Essential (primary) hypertension: Secondary | ICD-10-CM | POA: Diagnosis not present

## 2023-02-01 DIAGNOSIS — F324 Major depressive disorder, single episode, in partial remission: Secondary | ICD-10-CM | POA: Diagnosis not present

## 2023-02-01 DIAGNOSIS — I700 Atherosclerosis of aorta: Secondary | ICD-10-CM | POA: Diagnosis not present

## 2023-02-02 ENCOUNTER — Ambulatory Visit (INDEPENDENT_AMBULATORY_CARE_PROVIDER_SITE_OTHER): Payer: Medicare HMO | Admitting: Behavioral Health

## 2023-02-02 ENCOUNTER — Encounter: Payer: Self-pay | Admitting: Behavioral Health

## 2023-02-02 DIAGNOSIS — R69 Illness, unspecified: Secondary | ICD-10-CM | POA: Diagnosis not present

## 2023-02-02 DIAGNOSIS — F331 Major depressive disorder, recurrent, moderate: Secondary | ICD-10-CM | POA: Diagnosis not present

## 2023-02-02 DIAGNOSIS — F411 Generalized anxiety disorder: Secondary | ICD-10-CM

## 2023-02-02 MED ORDER — SERTRALINE HCL 100 MG PO TABS: 200.0000 mg | ORAL_TABLET | Freq: Every day | ORAL | 2 refills | Status: DC

## 2023-02-02 NOTE — Progress Notes (Signed)
[facility]  Patient ID: Joel Gross,  MRN: [id number]  PCP: [name], MD  Date of Evaluation: 02/02/2023 Time spent:30 minutes  Chief Complaint:  Chief Complaint   Anxiety; Depression; Follow-up; Patient Education; Medication Refill     HISTORY/CURRENT STATUS: HPI  72 year old male presents to this office for follow up and medication management. Using rollator to assist with ambulation. Sister-in-law is present with his consent. Still bothered by foot pain. Feels like the Zoloft is helping some but not enough. 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 To rule out lewy body. To continue to follow up with neurology. He feels like he could use a dosage increase today of Zoloft. Was unable to take Vraylar due to increase in tremors and medication was too expensive. Says his depression today is 4/10 and anxiety 3/10. Reports sleeping 7-8 hours per night.  Has not used Delta 8 recently.  No mania or psychosis. No SI/HI.   Past psychiatric medication failures: Risperidone Flexeril amiltriptyline  Xanax Cymbalta Abilify Buspar Wellbutrin Vraylar-cost Individual Medical History/ Review of Systems: Changes? :No   Allergies: Risperidone, Risperidone and related, and Hctz [hydrochlorothiazide]  Current Medications:  Current Outpatient Medications:    ALPRAZolam (XANAX) 0.25 MG tablet, TAKE 1 TO 2 TABLETS BY MOUTH TWICE DAILY AS NEEDED, Disp: 60 tablet, Rfl: 3   amLODipine (NORVASC) 10 MG tablet, Take 10 mg by mouth daily., Disp: , Rfl:    amLODipine (NORVASC) 10 MG tablet, Take by mouth., Disp: , Rfl:    atorvastatin (LIPITOR) 10 MG tablet, take 1/2 tablet by oral route  every day, Disp: , Rfl:    finasteride (PROSCAR) 5 MG tablet, Take 5 mg by mouth daily., Disp: , Rfl:    furosemide (LASIX) 20 MG tablet, Take 20 mg by mouth daily., Disp: , Rfl:    HYDROcodone-acetaminophen (NORCO) 7.5-325 MG tablet, Take 1 tablet by mouth 3 (three) times  daily., Disp: , Rfl:    lisinopril (PRINIVIL,ZESTRIL) 40 MG tablet, Take 40 mg by mouth daily., Disp: , Rfl:    lisinopril (ZESTRIL) 40 MG tablet, Take by mouth., Disp: , Rfl:    Multiple Vitamin (MULTI-VITAMIN) tablet, , Disp: , Rfl:    ofloxacin (OCUFLOX) 0.3 % ophthalmic solution, , Disp: , Rfl:    oxyCODONE-acetaminophen (PERCOCET) 7.5-325 MG tablet, Take 1 tablet by mouth 3 (three) times daily as needed., Disp: , Rfl:    Potassium Chloride ER 20 MEQ TBCR, Take 5 tablets by mouth daily., Disp: , Rfl:    potassium chloride SA (K-DUR,KLOR-CON) 20 MEQ tablet, Take 20 mEq by mouth 2 (two) times daily., Disp: , Rfl:    pravastatin (PRAVACHOL) 20 MG tablet, Take 40 mg by mouth daily., Disp: , Rfl:    prednisoLONE acetate (PRED FORTE) 1 % ophthalmic suspension, Place into the left eye., Disp: , Rfl:    pregabalin (LYRICA) 200 MG capsule, Take 1 capsule (200 mg total) by mouth 3 (three) times daily., Disp: 90 capsule, Rfl: 0   pregabalin (LYRICA) 200 MG capsule, Take by mouth., Disp: , Rfl:    rosuvastatin (CRESTOR) 5 MG tablet, Take 5 mg by mouth at bedtime as needed., Disp: , Rfl:    sertraline (ZOLOFT) 100 MG tablet, Take 2 tablets (200 mg total) by mouth daily., Disp: 60 tablet, Rfl: 2   silodosin (RAPAFLO) 8 MG CAPS capsule, Take 1 capsule by mouth at bedtime., Disp: , Rfl:    tamsulosin (FLOMAX) 0.4 MG CAPS capsule, Take 0.4  mg by mouth daily., Disp: , Rfl:    tamsulosin (FLOMAX) 0.4 MG CAPS capsule, Take by mouth., Disp: , Rfl:    traZODone (DESYREL) 50 MG tablet, Take 1-2 tablets (50-100 mg total) by mouth at bedtime as needed., Disp: 60 tablet, Rfl: 3 Medication Side Effects: none  Family Medical/ Social History: Changes? No  MENTAL HEALTH EXAM:  There were no vitals taken for this visit.There is no height or weight on file to calculate BMI.  General Appearance: Casual and Neat  Eye Contact:  Good  Speech:  Clear and Coherent  Volume:  Normal  Mood:  Anxious, Depressed, and  Dysphoric  Affect:  Appropriate  Thought Process:  Coherent  Orientation:  Full (Time, Place, and Person)  Thought Content: Logical   Suicidal Thoughts:  No  Homicidal Thoughts:  No  Memory:  WNL  Judgement:  Good  Insight:  Good  Psychomotor Activity:  Normal  Concentration:  Concentration: Good  Recall:  Good  Fund of Knowledge: Good  Language: Good  Assets:  Desire for Improvement  ADL's:  Intact  Cognition: WNL  Prognosis:  Good    DIAGNOSES:    ICD-10-CM   1. Major depressive disorder, recurrent episode, moderate  F33.1 sertraline (ZOLOFT) 100 MG tablet    2. Generalized anxiety disorder  F41.1 sertraline (ZOLOFT) 100 MG tablet      Receiving Psychotherapy: No    RECOMMENDATIONS:   Greater than 50% of 30  min. face to face time with patient was spent on counseling and coordination of care.   To increase Zoloft to 200 mg daily.  Continue with Trazodone 50-100 mg at bedtime daily. To continue with xanax 0.25 mg twice daily as needed for severe anxiety Will report any side effects or worsening symptoms To follow up in 8 weeks to reassess. Provided emergency contact information . Discussed potential benefits, risk, and side effects of benzodiazepines to include potential risk of tolerance and dependence, as well as possible drowsiness.  Advised patient not to drive if experiencing drowsiness and to take lowest possible effective dose to minimize risk of dependence and tolerance.  Reviewed PDMP   Elwanda Brooklyn, NP

## 2023-02-11 DIAGNOSIS — R269 Unspecified abnormalities of gait and mobility: Secondary | ICD-10-CM | POA: Diagnosis not present

## 2023-02-11 DIAGNOSIS — M810 Age-related osteoporosis without current pathological fracture: Secondary | ICD-10-CM | POA: Diagnosis not present

## 2023-02-11 DIAGNOSIS — R32 Unspecified urinary incontinence: Secondary | ICD-10-CM | POA: Diagnosis not present

## 2023-02-11 DIAGNOSIS — I10 Essential (primary) hypertension: Secondary | ICD-10-CM | POA: Diagnosis not present

## 2023-02-11 DIAGNOSIS — R600 Localized edema: Secondary | ICD-10-CM | POA: Diagnosis not present

## 2023-02-11 DIAGNOSIS — M1990 Unspecified osteoarthritis, unspecified site: Secondary | ICD-10-CM | POA: Diagnosis not present

## 2023-02-11 DIAGNOSIS — Z008 Encounter for other general examination: Secondary | ICD-10-CM | POA: Diagnosis not present

## 2023-02-11 DIAGNOSIS — N400 Enlarged prostate without lower urinary tract symptoms: Secondary | ICD-10-CM | POA: Diagnosis not present

## 2023-02-11 DIAGNOSIS — M5430 Sciatica, unspecified side: Secondary | ICD-10-CM | POA: Diagnosis not present

## 2023-02-11 DIAGNOSIS — K219 Gastro-esophageal reflux disease without esophagitis: Secondary | ICD-10-CM | POA: Diagnosis not present

## 2023-02-11 DIAGNOSIS — R69 Illness, unspecified: Secondary | ICD-10-CM | POA: Diagnosis not present

## 2023-02-11 DIAGNOSIS — E785 Hyperlipidemia, unspecified: Secondary | ICD-10-CM | POA: Diagnosis not present

## 2023-02-11 DIAGNOSIS — F4310 Post-traumatic stress disorder, unspecified: Secondary | ICD-10-CM | POA: Diagnosis not present

## 2023-02-17 DIAGNOSIS — G629 Polyneuropathy, unspecified: Secondary | ICD-10-CM | POA: Diagnosis not present

## 2023-02-17 DIAGNOSIS — M5137 Other intervertebral disc degeneration, lumbosacral region: Secondary | ICD-10-CM | POA: Diagnosis not present

## 2023-02-17 DIAGNOSIS — G609 Hereditary and idiopathic neuropathy, unspecified: Secondary | ICD-10-CM | POA: Diagnosis not present

## 2023-02-17 DIAGNOSIS — Z1389 Encounter for screening for other disorder: Secondary | ICD-10-CM | POA: Diagnosis not present

## 2023-02-17 DIAGNOSIS — Z79891 Long term (current) use of opiate analgesic: Secondary | ICD-10-CM | POA: Diagnosis not present

## 2023-03-03 ENCOUNTER — Ambulatory Visit: Payer: Medicare HMO | Admitting: Physician Assistant

## 2023-03-06 ENCOUNTER — Ambulatory Visit: Payer: Medicare HMO | Admitting: Physician Assistant

## 2023-03-06 ENCOUNTER — Ambulatory Visit (INDEPENDENT_AMBULATORY_CARE_PROVIDER_SITE_OTHER): Payer: Medicare HMO

## 2023-03-06 ENCOUNTER — Ambulatory Visit (INDEPENDENT_AMBULATORY_CARE_PROVIDER_SITE_OTHER): Payer: Medicare HMO | Admitting: Physician Assistant

## 2023-03-06 ENCOUNTER — Encounter: Payer: Self-pay | Admitting: Physician Assistant

## 2023-03-06 VITALS — BP 160/90 | HR 59 | Ht 74.0 in | Wt 207.0 lb

## 2023-03-06 DIAGNOSIS — G8929 Other chronic pain: Secondary | ICD-10-CM | POA: Diagnosis not present

## 2023-03-06 DIAGNOSIS — M1711 Unilateral primary osteoarthritis, right knee: Secondary | ICD-10-CM | POA: Insufficient documentation

## 2023-03-06 DIAGNOSIS — F09 Unspecified mental disorder due to known physiological condition: Secondary | ICD-10-CM

## 2023-03-06 DIAGNOSIS — M25561 Pain in right knee: Secondary | ICD-10-CM

## 2023-03-06 DIAGNOSIS — G3184 Mild cognitive impairment, so stated: Secondary | ICD-10-CM | POA: Diagnosis not present

## 2023-03-06 HISTORY — DX: Unilateral primary osteoarthritis, right knee: M17.11

## 2023-03-06 NOTE — Progress Notes (Signed)
Office Visit Note   Patient: Joel Gross           Date of Birth: [birth year]           MRN: [id number] Visit Date: 03/06/2023              Requested by: Noni Saupe, MD [address] [place],  [place] [address] PCP: Noni Saupe, MD   Assessment & Plan: Visit Diagnoses:  1. Chronic pain of right knee   2. Unilateral primary osteoarthritis, right knee     Plan: Pleasant 72 year old gentleman who is accompanied by his sister-in-law.  He comes in today saying that his right knee "just does not feel".  Denies any recent injury but has had problems with this knee over the years and did have a knee arthroscopy when he was in his 30s.  He does have varus alignment and I think this is what he notices most.  He is rates his pain as mild.  I have recommended a course of physical therapy which she like to do near his home.  I think strengthening both of his legs will be helpful to him as he has arthritis in the left knee as well.  He also can use Voltaren gel if his knee swells or he has problems.  He has been advised not to take anti-inflammatories just Tylenol.  We could consider injections but he is not painful at this time.  If he wanted to we could order a brace for him however for now I think we should try physical therapy as a first-line of treatment  Follow-Up Instructions: Return in about 6 weeks (around 04/17/2023).   Orders:  Orders Placed This Encounter  Procedures   XR KNEE 3 VIEW RIGHT   Ambulatory referral to Physical Therapy   No orders of the defined types were placed in this encounter.     Procedures: No procedures performed   Clinical Data: No additional findings.   Subjective: Chief Complaint  Patient presents with   Right Knee - Pain    HPI Joel Gross is a pleasant 72 year old gentleman with a history of chronic right knee pain.  He said he had a significant injury when he was young.  He did have a knee arthroscopy in his 30s.  He does not have a  lot of pain and describes it as mild.  He ambulates with a cane because his knee he thinks just "looks different ".  Denies any recent injury denies any fever or chills he has not really done any treatment recently but does recall getting injections into May be doing physical therapy in the distant past  Review of Systems  All other systems reviewed and are negative.    Objective: Vital Signs: There were no vitals taken for this visit.  Physical Exam Constitutional:      Appearance: Normal appearance.  Pulmonary:     Effort: Pulmonary effort is normal.  Skin:    General: Skin is warm and dry.  Neurological:     General: No focal deficit present.     Mental Status: He is alert.     Ortho Exam Examination of his right knee he has varus malalignment.  He has no effusion no erythema no soft tissue swelling.  He does have some grinding with range of motion patellar mobility is very limited he does not quite come to full extension.  No tenderness over the medial lateral joint line he is stable  compartments are soft and nontender strength is good he is neurovascular intact Specialty Comments:  No specialty comments available.  Imaging: XR KNEE 3 VIEW RIGHT  Result Date: 03/06/2023 Three-view radiographs of his right knee demonstrate advanced tricompartmental arthritis with sclerotic changes periarticular osteophytes and loss of joint space most significantly in the medial compartment and the patellofemoral joint    PMFS History: Patient Active Problem List   Diagnosis Date Noted   Unilateral primary osteoarthritis, right knee 03/06/2023   Suicide attempt by drug overdose    History of elevated PSA 11/03/2019   HTN (hypertension) 06/23/2017   Hyperlipidemia 06/23/2017   GAD (generalized anxiety disorder) 06/23/2017   MDD (major depressive disorder), recurrent, severe, with psychosis 06/23/2017   BPH with obstruction/lower urinary tract symptoms 06/19/2017   Chronic pain disorder  06/19/2017   Idiopathic progressive polyneuropathy 11/25/2013   Obesity 09/02/2011   Past Medical History:  Diagnosis Date   BPH with obstruction/lower urinary tract symptoms 06/19/2017   Chronic pain disorder 06/19/2017   GAD (generalized anxiety disorder) 06/23/2017   History of elevated PSA 11/03/2019   HTN (hypertension) 06/23/2017   Hyperlipidemia 06/23/2017   Idiopathic progressive polyneuropathy 11/25/2013   MDD (major depressive disorder), recurrent, severe, with psychosis 06/23/2017   Visual hallucinations   Suicide attempt by drug overdose    Oxycodone; ~ winter 2023    Family History  Problem Relation Age of Onset   Anxiety disorder Mother    Depression Mother     History reviewed. No pertinent surgical history. Social History   Occupational History   Occupation: Retired    Comment: Chartered certified accountant; [name]  Tobacco Use   Smoking status: Never   Smokeless tobacco: Never  Vaping Use   Vaping Use: Never used  Substance and Sexual Activity   Alcohol use: No   Drug use: No   Sexual activity: Yes    Birth control/protection: None
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
## 2023-03-08 DIAGNOSIS — G3184 Mild cognitive impairment, so stated: Secondary | ICD-10-CM

## 2023-03-08 HISTORY — DX: Mild cognitive impairment of uncertain or unknown etiology: G31.84

## 2023-03-13 DIAGNOSIS — R269 Unspecified abnormalities of gait and mobility: Secondary | ICD-10-CM | POA: Diagnosis not present

## 2023-03-13 DIAGNOSIS — M6281 Muscle weakness (generalized): Secondary | ICD-10-CM | POA: Diagnosis not present

## 2023-03-13 DIAGNOSIS — M25561 Pain in right knee: Secondary | ICD-10-CM | POA: Diagnosis not present

## 2023-03-17 DIAGNOSIS — Z1389 Encounter for screening for other disorder: Secondary | ICD-10-CM | POA: Diagnosis not present

## 2023-03-17 DIAGNOSIS — R269 Unspecified abnormalities of gait and mobility: Secondary | ICD-10-CM | POA: Diagnosis not present

## 2023-03-17 DIAGNOSIS — G609 Hereditary and idiopathic neuropathy, unspecified: Secondary | ICD-10-CM | POA: Diagnosis not present

## 2023-03-17 DIAGNOSIS — M6281 Muscle weakness (generalized): Secondary | ICD-10-CM | POA: Diagnosis not present

## 2023-03-17 DIAGNOSIS — G629 Polyneuropathy, unspecified: Secondary | ICD-10-CM | POA: Diagnosis not present

## 2023-03-17 DIAGNOSIS — M25561 Pain in right knee: Secondary | ICD-10-CM | POA: Diagnosis not present

## 2023-03-17 DIAGNOSIS — M5137 Other intervertebral disc degeneration, lumbosacral region: Secondary | ICD-10-CM | POA: Diagnosis not present

## 2023-03-17 DIAGNOSIS — Z79891 Long term (current) use of opiate analgesic: Secondary | ICD-10-CM | POA: Diagnosis not present

## 2023-03-20 DIAGNOSIS — R269 Unspecified abnormalities of gait and mobility: Secondary | ICD-10-CM | POA: Diagnosis not present

## 2023-03-20 DIAGNOSIS — M25561 Pain in right knee: Secondary | ICD-10-CM | POA: Diagnosis not present

## 2023-03-20 DIAGNOSIS — M6281 Muscle weakness (generalized): Secondary | ICD-10-CM | POA: Diagnosis not present

## 2023-03-24 DIAGNOSIS — M25561 Pain in right knee: Secondary | ICD-10-CM | POA: Diagnosis not present

## 2023-03-24 DIAGNOSIS — R269 Unspecified abnormalities of gait and mobility: Secondary | ICD-10-CM | POA: Diagnosis not present

## 2023-03-24 DIAGNOSIS — M6281 Muscle weakness (generalized): Secondary | ICD-10-CM | POA: Diagnosis not present

## 2023-03-27 DIAGNOSIS — R269 Unspecified abnormalities of gait and mobility: Secondary | ICD-10-CM | POA: Diagnosis not present

## 2023-03-27 DIAGNOSIS — M25561 Pain in right knee: Secondary | ICD-10-CM | POA: Diagnosis not present

## 2023-03-27 DIAGNOSIS — M6281 Muscle weakness (generalized): Secondary | ICD-10-CM | POA: Diagnosis not present

## 2023-04-01 DIAGNOSIS — M6281 Muscle weakness (generalized): Secondary | ICD-10-CM | POA: Diagnosis not present

## 2023-04-01 DIAGNOSIS — R269 Unspecified abnormalities of gait and mobility: Secondary | ICD-10-CM | POA: Diagnosis not present

## 2023-04-01 DIAGNOSIS — M25561 Pain in right knee: Secondary | ICD-10-CM | POA: Diagnosis not present

## 2023-04-03 ENCOUNTER — Ambulatory Visit: Payer: Medicare HMO | Admitting: Physician Assistant

## 2023-04-03 ENCOUNTER — Encounter: Payer: Self-pay | Admitting: Physician Assistant

## 2023-04-03 DIAGNOSIS — M25561 Pain in right knee: Secondary | ICD-10-CM

## 2023-04-03 DIAGNOSIS — N401 Enlarged prostate with lower urinary tract symptoms: Secondary | ICD-10-CM | POA: Diagnosis not present

## 2023-04-03 DIAGNOSIS — R310 Gross hematuria: Secondary | ICD-10-CM | POA: Diagnosis not present

## 2023-04-03 DIAGNOSIS — G8929 Other chronic pain: Secondary | ICD-10-CM

## 2023-04-03 DIAGNOSIS — N210 Calculus in bladder: Secondary | ICD-10-CM | POA: Diagnosis not present

## 2023-04-03 DIAGNOSIS — R3914 Feeling of incomplete bladder emptying: Secondary | ICD-10-CM | POA: Diagnosis not present

## 2023-04-03 NOTE — Progress Notes (Signed)
Office Visit Note   Patient: Joel Gross           Date of Birth: 01/04/1951           MRN: [id number] Visit Date: 04/03/2023              Requested by: Joel Saupe, MD [address] [place],  [place] [address] PCP: Joel Saupe, MD  Chief Complaint  Patient presents with   Right Knee - Pain      HPI: Joel Gross is a pleasant 72 year old gentleman who comes in follow-up for his right knee pain.  He has a history of arthritis in his right knee.  At his last visit he really was not having much pain but more weakness.  He has been working with physical therapy and feels he has made progress.  Denies any injury.  Assessment & Plan: Visit Diagnoses: Osteoarthritis right knee  Plan: He has completed physical therapy and does feel stronger.  I emphasized the importance of continuing to do home exercises.  He enjoys getting on a stationary bike and I think that is a great idea.  He does not have much pain today so I deferred doing any type of injection.  He may follow-up as needed.  He also asked me to look at a lesion on the lateral side of his lower calf.  It has been there about a week and a half.  Denies any insect bites [place]  He and his wife say that this is actually getting much better.  I do not see any cellulitis looks like a superficial abrasion.  Does have some clear serous drainage.  I encouraged them to continue to wash it and keep it clean and dry.  Because it is getting significantly better they may observe this.  Of course if it begins to get red drains more has a foul odor has more pain he is to contact his primary care provider or go to an urgent care  Follow-Up Instructions: No follow-ups on file.   Ortho Exam  Patient is alert, oriented, no adenopathy, well-dressed, normal affect, normal respiratory effort. Examination of his right knee no effusion no erythema compartments are soft and nontender he is neurovascular intact good  range of motion of his ankle some grinding with range of motion strength has improved Right lateral calf he has a small area of abrasion superficial skin lesion.  There is no surrounding cellulitis there is no erythema compartments are soft and has a small amount of serous fluid without any odor.  No real pain Imaging: No results found. No images are attached to the encounter.  Labs: Lab Results  Component Value Date   HGBA1C 5.5 06/20/2017     Lab Results  Component Value Date   ALBUMIN 4.5 06/18/2017    No results found for: "MG" No results found for: "VD25OH"  No results found for: "PREALBUMIN"    Latest Ref Rng & Units 06/18/2017    5:52 PM  CBC EXTENDED  WBC 4.0 - 10.5 K/uL 10.6   RBC 4.22 - 5.81 MIL/uL 5.16   Hemoglobin 13.0 - 17.0 g/dL 59.5   HCT 63.8 - 75.6 % 44.8   Platelets 150 - 400 K/uL 244      There is no height or weight on file to calculate BMI.  Orders:  No orders of the defined types were placed in this encounter.  No orders of  the defined types were placed in this encounter.    Procedures: No procedures performed  Clinical Data: No additional findings.  ROS:  All other systems negative, except as noted in the HPI. Review of Systems  Objective: Vital Signs: There were no vitals taken for this visit.  Specialty Comments:  No specialty comments available.  PMFS History: Patient Active Problem List   Diagnosis Date Noted   Mild cognitive impairment 03/08/2023   Unilateral primary osteoarthritis, right knee 03/06/2023   Suicide attempt by drug overdose    History of elevated PSA 11/03/2019   HTN (hypertension) 06/23/2017   Hyperlipidemia 06/23/2017   GAD (generalized anxiety disorder) 06/23/2017   MDD (major depressive disorder), recurrent, severe, with psychosis 06/23/2017   BPH with obstruction/lower urinary tract symptoms 06/19/2017   Chronic pain disorder 06/19/2017   Idiopathic progressive polyneuropathy 11/25/2013   Obesity  09/02/2011   Past Medical History:  Diagnosis Date   BPH with obstruction/lower urinary tract symptoms 06/19/2017   Chronic pain disorder 06/19/2017   GAD (generalized anxiety disorder) 06/23/2017   History of elevated PSA 11/03/2019   HTN (hypertension) 06/23/2017   Hyperlipidemia 06/23/2017   Idiopathic progressive polyneuropathy 11/25/2013   MDD (major depressive disorder), recurrent, severe, with psychosis 06/23/2017   Visual hallucinations   Suicide attempt by drug overdose    Oxycodone; ~ winter 2023    Family History  Problem Relation Age of Onset   Anxiety disorder Mother    Depression Mother     History reviewed. No pertinent surgical history. Social History   Occupational History   Occupation: Retired    Comment: Chartered certified accountant; [name]  Tobacco Use   Smoking status: Never   Smokeless tobacco: Never  Vaping Use   Vaping Use: Never used  Substance and Sexual Activity   Alcohol use: No   Drug use: No   Sexual activity: Yes    Birth control/protection: None

## 2023-04-06 ENCOUNTER — Ambulatory Visit (INDEPENDENT_AMBULATORY_CARE_PROVIDER_SITE_OTHER): Payer: Medicare HMO | Admitting: Behavioral Health

## 2023-04-06 ENCOUNTER — Encounter: Payer: Self-pay | Admitting: Behavioral Health

## 2023-04-06 DIAGNOSIS — F411 Generalized anxiety disorder: Secondary | ICD-10-CM | POA: Diagnosis not present

## 2023-04-06 DIAGNOSIS — F329 Major depressive disorder, single episode, unspecified: Secondary | ICD-10-CM

## 2023-04-06 DIAGNOSIS — F331 Major depressive disorder, recurrent, moderate: Secondary | ICD-10-CM

## 2023-04-06 MED ORDER — REXULTI 0.5 MG PO TABS
0.5000 mg | ORAL_TABLET | Freq: Every day | ORAL | 1 refills | Status: DC
Start: 2023-04-06 — End: 2023-06-01

## 2023-04-06 NOTE — Progress Notes (Signed)
[facility]  Patient ID: ADOLFO BRABAND,  MRN: [id number]  PCP: Noni Saupe, MD  Date of Evaluation: 04/06/2023 Time spent:30 minutes  Chief Complaint:  Chief Complaint   Anxiety; Depression; Follow-up; Medication Refill; Patient Education     HISTORY/CURRENT STATUS: HPI  72 year old male presents to this office for follow up and medication management. Using a 4-point cane to assist in ambulation. Sister-in-law is present with his consent. Still bothered by foot pain. Feels like the Zoloft is helping some but not enough. 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  He is requesting new medication alternatives that may help.  Says his depression today is 5/10 and anxiety 3/10. Reports sleeping 7-8 hours per night.  Has not used Delta 8 recently.  No mania or psychosis. No SI/HI.   Past psychiatric medication failures: Risperidone Flexeril amiltriptyline  Xanax Cymbalta Abilify Buspar Wellbutrin Vraylar-Reports caused foot drop  Individual Medical History/ Review of Systems: Changes? :No   Allergies: Risperidone, Risperidone and related, and Hctz [hydrochlorothiazide]  Current Medications:  Current Outpatient Medications:    ALPRAZolam (XANAX) 0.25 MG tablet, TAKE 1 TO 2 TABLETS BY MOUTH TWICE DAILY AS NEEDED, Disp: 60 tablet, Rfl: 3   amLODipine (NORVASC) 10 MG tablet, Take 10 mg by mouth daily., Disp: , Rfl:    amLODipine (NORVASC) 10 MG tablet, Take by mouth. (Patient not taking: Reported on 03/06/2023), Disp: , Rfl:    atorvastatin (LIPITOR) 10 MG tablet, take 1/2 tablet by oral route  every day, Disp: , Rfl:    finasteride (PROSCAR) 5 MG tablet, Take 5 mg by mouth daily., Disp: , Rfl:    furosemide (LASIX) 20 MG tablet, Take 20 mg by mouth daily., Disp: , Rfl:    HYDROcodone-acetaminophen (NORCO) 7.5-325 MG tablet, Take 1 tablet by mouth 3 (three) times daily., Disp: , Rfl:    lisinopril (PRINIVIL,ZESTRIL) 40 MG tablet, Take 40 mg by  mouth daily., Disp: , Rfl:    lisinopril (ZESTRIL) 40 MG tablet, Take by mouth. (Patient not taking: Reported on 03/06/2023), Disp: , Rfl:    Multiple Vitamin (MULTI-VITAMIN) tablet, , Disp: , Rfl:    ofloxacin (OCUFLOX) 0.3 % ophthalmic solution, , Disp: , Rfl:    oxyCODONE-acetaminophen (PERCOCET) 7.5-325 MG tablet, Take 1 tablet by mouth 3 (three) times daily as needed., Disp: , Rfl:    Potassium Chloride ER 20 MEQ TBCR, Take 5 tablets by mouth daily., Disp: , Rfl:    potassium chloride SA (K-DUR,KLOR-CON) 20 MEQ tablet, Take 20 mEq by mouth 2 (two) times daily., Disp: , Rfl:    pravastatin (PRAVACHOL) 20 MG tablet, Take 40 mg by mouth daily., Disp: , Rfl:    prednisoLONE acetate (PRED FORTE) 1 % ophthalmic suspension, Place into the left eye., Disp: , Rfl:    pregabalin (LYRICA) 200 MG capsule, Take 1 capsule (200 mg total) by mouth 3 (three) times daily., Disp: 90 capsule, Rfl: 0   pregabalin (LYRICA) 200 MG capsule, Take by mouth. (Patient not taking: Reported on 03/06/2023), Disp: , Rfl:    rosuvastatin (CRESTOR) 5 MG tablet, Take 5 mg by mouth at bedtime as needed., Disp: , Rfl:    sertraline (ZOLOFT) 100 MG tablet, Take 2 tablets (200 mg total) by mouth daily., Disp: 60 tablet, Rfl: 2   silodosin (RAPAFLO) 8 MG CAPS capsule, Take 1 capsule by mouth at bedtime., Disp: , Rfl:    tamsulosin (FLOMAX) 0.4 MG CAPS capsule, Take 0.4 mg by mouth daily.,  Disp: , Rfl:    tamsulosin (FLOMAX) 0.4 MG CAPS capsule, Take by mouth. (Patient not taking: Reported on 03/06/2023), Disp: , Rfl:    traZODone (DESYREL) 50 MG tablet, Take 1-2 tablets (50-100 mg total) by mouth at bedtime as needed., Disp: 60 tablet, Rfl: 3 Medication Side Effects: none  Family Medical/ Social History: Changes? No  MENTAL HEALTH EXAM:  There were no vitals taken for this visit.There is no height or weight on file to calculate BMI.  General Appearance: Casual, Neat, and Well Groomed  Eye Contact:  Good  Speech:  Talkative   Volume:  Normal  Mood:  Anxious, Depressed, and Dysphoric  Affect:  Appropriate  Thought Process:  Coherent  Orientation:  Full (Time, Place, and Person)  Thought Content: Logical   Suicidal Thoughts:  No  Homicidal Thoughts:  No  Memory:  WNL  Judgement:  Good  Insight:  Good  Psychomotor Activity:  Normal  Concentration:  Concentration: Good  Recall:  Good  Fund of Knowledge: Good  Language: Good  Assets:  Desire for Improvement  ADL's:  Intact  Cognition: WNL  Prognosis:  Good    DIAGNOSES:    ICD-10-CM   1. Treatment-resistant depression  F32.9     2. Major depressive disorder, recurrent episode, moderate (HCC)  F33.1     3. Generalized anxiety disorder  F41.1       Receiving Psychotherapy: No    RECOMMENDATIONS:   Greater than 50% of 30  min. face to face time with patient was spent on counseling and coordination of care.  He is still complaining of lingering depression, not wanting to get out of bed, sadness. We reviewed medications and talked about new options.  Will start Rexulti 0.5 mg daily. To continue Zoloft to 200 mg daily.  Continue with Trazodone 50-100 mg at bedtime daily. To continue with xanax 0.25 mg twice daily as needed for severe anxiety Will report any side effects or worsening symptoms To follow up in 4weeks to reassess. Provided emergency contact information . Discussed potential metabolic side effects associated with atypical antipsychotics, as well as potential risk for movement side effects. Advised pt to contact office if movement side effects occur.   Discussed potential benefits, risk, and side effects of benzodiazepines to include potential risk of tolerance and dependence, as well as possible drowsiness.  Advised patient not to drive if experiencing drowsiness and to take lowest possible effective dose to minimize risk of dependence and tolerance.  Reviewed PDMP   Joan Flores, NP

## 2023-04-08 DIAGNOSIS — L299 Pruritus, unspecified: Secondary | ICD-10-CM | POA: Diagnosis not present

## 2023-04-08 DIAGNOSIS — L300 Nummular dermatitis: Secondary | ICD-10-CM | POA: Diagnosis not present

## 2023-04-14 DIAGNOSIS — G629 Polyneuropathy, unspecified: Secondary | ICD-10-CM | POA: Diagnosis not present

## 2023-04-14 DIAGNOSIS — G609 Hereditary and idiopathic neuropathy, unspecified: Secondary | ICD-10-CM | POA: Diagnosis not present

## 2023-04-14 DIAGNOSIS — Z1389 Encounter for screening for other disorder: Secondary | ICD-10-CM | POA: Diagnosis not present

## 2023-04-14 DIAGNOSIS — Z79891 Long term (current) use of opiate analgesic: Secondary | ICD-10-CM | POA: Diagnosis not present

## 2023-04-14 DIAGNOSIS — M5137 Other intervertebral disc degeneration, lumbosacral region: Secondary | ICD-10-CM | POA: Diagnosis not present

## 2023-05-03 ENCOUNTER — Other Ambulatory Visit: Payer: Self-pay | Admitting: Behavioral Health

## 2023-05-03 DIAGNOSIS — F331 Major depressive disorder, recurrent, moderate: Secondary | ICD-10-CM

## 2023-05-03 DIAGNOSIS — F411 Generalized anxiety disorder: Secondary | ICD-10-CM

## 2023-05-03 NOTE — Telephone Encounter (Signed)
Has appt. tomorrow

## 2023-05-04 ENCOUNTER — Encounter: Payer: Self-pay | Admitting: Behavioral Health

## 2023-05-04 ENCOUNTER — Ambulatory Visit: Payer: Medicare HMO | Admitting: Behavioral Health

## 2023-05-04 DIAGNOSIS — F329 Major depressive disorder, single episode, unspecified: Secondary | ICD-10-CM

## 2023-05-04 DIAGNOSIS — F411 Generalized anxiety disorder: Secondary | ICD-10-CM

## 2023-05-04 DIAGNOSIS — F331 Major depressive disorder, recurrent, moderate: Secondary | ICD-10-CM | POA: Diagnosis not present

## 2023-05-04 NOTE — Progress Notes (Signed)
[facility]  Patient ID: Joel Gross,  MRN: [id number]  PCP: Noni Saupe, MD  Date of Evaluation: 05/04/2023 Time spent:30 minutes  Chief Complaint:  Chief Complaint   Depression; Anxiety; Follow-up; Establish Care; Medication Refill; Medication Problem; Patient Education     HISTORY/CURRENT STATUS: HPI  72 year old male presents to this office for follow up and medication management. Using a 4-point cane to assist in ambulation. Sister-in-law is present with his consent. Still bothered by foot pain. Says he feels like Rexulti was helping his depression and he felt less agitated.  He would like to continue the medication but says they wanted to charge him $500.00.  He is asking for help.  Says his depression today is 5/10 and anxiety 3/10. Reports sleeping 7-8 hours per night.  Has not used Delta 8 recently.  No mania or psychosis. No SI/HI.   Past psychiatric medication failures: Risperidone Flexeril amiltriptyline  Xanax Cymbalta Abilify Buspar Wellbutrin Vraylar-Reports caused foot drop  Individual Medical History/ Review of Systems: Changes? :No   Allergies: Risperidone, Risperidone and related, and Hctz [hydrochlorothiazide]  Current Medications:  Current Outpatient Medications:    ALPRAZolam (XANAX) 0.25 MG tablet, TAKE 1 TO 2 TABLETS BY MOUTH TWICE DAILY AS NEEDED, Disp: 60 tablet, Rfl: 3   amLODipine (NORVASC) 10 MG tablet, Take 10 mg by mouth daily., Disp: , Rfl:    amLODipine (NORVASC) 10 MG tablet, Take by mouth. (Patient not taking: Reported on 03/06/2023), Disp: , Rfl:    atorvastatin (LIPITOR) 10 MG tablet, take 1/2 tablet by oral route  every day, Disp: , Rfl:    Brexpiprazole (REXULTI) 0.5 MG TABS, Take 1 tablet (0.5 mg total) by mouth daily after breakfast., Disp: 30 tablet, Rfl: 1   finasteride (PROSCAR) 5 MG tablet, Take 5 mg by mouth daily., Disp: , Rfl:    furosemide (LASIX) 20 MG tablet, Take 20 mg by mouth daily., Disp: , Rfl:     HYDROcodone-acetaminophen (NORCO) 7.5-325 MG tablet, Take 1 tablet by mouth 3 (three) times daily., Disp: , Rfl:    lisinopril (PRINIVIL,ZESTRIL) 40 MG tablet, Take 40 mg by mouth daily., Disp: , Rfl:    lisinopril (ZESTRIL) 40 MG tablet, Take by mouth. (Patient not taking: Reported on 03/06/2023), Disp: , Rfl:    Multiple Vitamin (MULTI-VITAMIN) tablet, , Disp: , Rfl:    ofloxacin (OCUFLOX) 0.3 % ophthalmic solution, , Disp: , Rfl:    oxyCODONE-acetaminophen (PERCOCET) 7.5-325 MG tablet, Take 1 tablet by mouth 3 (three) times daily as needed., Disp: , Rfl:    Potassium Chloride ER 20 MEQ TBCR, Take 5 tablets by mouth daily., Disp: , Rfl:    potassium chloride SA (K-DUR,KLOR-CON) 20 MEQ tablet, Take 20 mEq by mouth 2 (two) times daily., Disp: , Rfl:    pravastatin (PRAVACHOL) 20 MG tablet, Take 40 mg by mouth daily., Disp: , Rfl:    prednisoLONE acetate (PRED FORTE) 1 % ophthalmic suspension, Place into the left eye., Disp: , Rfl:    pregabalin (LYRICA) 200 MG capsule, Take 1 capsule (200 mg total) by mouth 3 (three) times daily., Disp: 90 capsule, Rfl: 0   pregabalin (LYRICA) 200 MG capsule, Take by mouth. (Patient not taking: Reported on 03/06/2023), Disp: , Rfl:    rosuvastatin (CRESTOR) 5 MG tablet, Take 5 mg by mouth at bedtime as needed., Disp: , Rfl:    sertraline (ZOLOFT) 100 MG tablet, Take 2 tablets (200 mg total) by mouth daily., Disp: 60 tablet, Rfl:  2   silodosin (RAPAFLO) 8 MG CAPS capsule, Take 1 capsule by mouth at bedtime., Disp: , Rfl:    tamsulosin (FLOMAX) 0.4 MG CAPS capsule, Take 0.4 mg by mouth daily., Disp: , Rfl:    tamsulosin (FLOMAX) 0.4 MG CAPS capsule, Take by mouth. (Patient not taking: Reported on 03/06/2023), Disp: , Rfl:    traZODone (DESYREL) 50 MG tablet, Take 1-2 tablets (50-100 mg total) by mouth at bedtime as needed., Disp: 60 tablet, Rfl: 3 Medication Side Effects: none  Family Medical/ Social History: Changes? No  MENTAL HEALTH EXAM:  There were no vitals  taken for this visit.There is no height or weight on file to calculate BMI.  General Appearance: Casual, Neat, and Well Groomed  Eye Contact:  Good  Speech:  Clear and Coherent  Volume:  Normal  Mood:  Depressed and Dysphoric  Affect:  Appropriate  Thought Process:  Coherent  Orientation:  Full (Time, Place, and Person)  Thought Content: Logical   Suicidal Thoughts:  No  Homicidal Thoughts:  No  Memory:  WNL  Judgement:  Good  Insight:  Good  Psychomotor Activity:  Normal  Concentration:  Concentration: Good  Recall:  Good  Fund of Knowledge: Good  Language: Good  Assets:  Desire for Improvement  ADL's:  Intact  Cognition: WNL  Prognosis:  Good    DIAGNOSES:    ICD-10-CM   1. Major depressive disorder, recurrent episode, moderate (HCC)  F33.1     2. Treatment-resistant depression  F32.9     3. Generalized anxiety disorder  F41.1       Receiving Psychotherapy:    RECOMMENDATIONS:  Greater than 50% of 30  min. face to face time with patient was spent on counseling and coordination of care.  He is still complaining of lingering depression, not wanting to get out of bed, sadness. He says Rexulti seemed like it was helping but he could not afford. Says they still gave him a price of $ 500.00. He is asking for help because he would like to stay on the medication.  Continue  Rexulti 0.5 mg daily. Samples provided.  To continue Zoloft to 200 mg daily.  Continue with Trazodone 50-100 mg at bedtime daily. To continue with xanax 0.25 mg twice daily as needed for severe anxiety Will report any side effects or worsening symptoms To follow up in 4weeks to reassess. Provided emergency contact information . Discussed potential metabolic side effects associated with atypical antipsychotics, as well as potential risk for movement side effects. Advised pt to contact office if movement side effects occur.   Discussed potential benefits, risk, and side effects of benzodiazepines to  include potential risk of tolerance and dependence, as well as possible drowsiness.  Advised patient not to drive if experiencing drowsiness and to take lowest possible effective dose to minimize risk of dependence and tolerance.  Reviewed PDMP        Joan Flores, NP

## 2023-05-14 DIAGNOSIS — E860 Dehydration: Secondary | ICD-10-CM | POA: Diagnosis not present

## 2023-05-14 DIAGNOSIS — R4182 Altered mental status, unspecified: Secondary | ICD-10-CM | POA: Diagnosis not present

## 2023-05-14 DIAGNOSIS — I10 Essential (primary) hypertension: Secondary | ICD-10-CM | POA: Diagnosis not present

## 2023-05-14 DIAGNOSIS — R161 Splenomegaly, not elsewhere classified: Secondary | ICD-10-CM | POA: Diagnosis not present

## 2023-05-14 DIAGNOSIS — K828 Other specified diseases of gallbladder: Secondary | ICD-10-CM | POA: Diagnosis not present

## 2023-05-14 DIAGNOSIS — Z743 Need for continuous supervision: Secondary | ICD-10-CM | POA: Diagnosis not present

## 2023-05-14 DIAGNOSIS — G9389 Other specified disorders of brain: Secondary | ICD-10-CM | POA: Diagnosis not present

## 2023-05-14 DIAGNOSIS — I6782 Cerebral ischemia: Secondary | ICD-10-CM | POA: Diagnosis not present

## 2023-05-14 DIAGNOSIS — R059 Cough, unspecified: Secondary | ICD-10-CM | POA: Diagnosis not present

## 2023-05-15 DIAGNOSIS — E785 Hyperlipidemia, unspecified: Secondary | ICD-10-CM | POA: Diagnosis not present

## 2023-05-15 DIAGNOSIS — R4182 Altered mental status, unspecified: Secondary | ICD-10-CM | POA: Diagnosis not present

## 2023-05-15 DIAGNOSIS — G9341 Metabolic encephalopathy: Secondary | ICD-10-CM | POA: Diagnosis not present

## 2023-05-15 DIAGNOSIS — N400 Enlarged prostate without lower urinary tract symptoms: Secondary | ICD-10-CM | POA: Diagnosis not present

## 2023-05-15 DIAGNOSIS — E860 Dehydration: Secondary | ICD-10-CM | POA: Diagnosis not present

## 2023-05-15 DIAGNOSIS — E876 Hypokalemia: Secondary | ICD-10-CM | POA: Diagnosis not present

## 2023-05-15 DIAGNOSIS — G9389 Other specified disorders of brain: Secondary | ICD-10-CM | POA: Diagnosis not present

## 2023-05-15 DIAGNOSIS — J441 Chronic obstructive pulmonary disease with (acute) exacerbation: Secondary | ICD-10-CM | POA: Diagnosis not present

## 2023-05-15 DIAGNOSIS — E8729 Other acidosis: Secondary | ICD-10-CM | POA: Diagnosis not present

## 2023-05-15 DIAGNOSIS — M1990 Unspecified osteoarthritis, unspecified site: Secondary | ICD-10-CM | POA: Diagnosis not present

## 2023-05-15 DIAGNOSIS — I6782 Cerebral ischemia: Secondary | ICD-10-CM | POA: Diagnosis not present

## 2023-05-15 DIAGNOSIS — R161 Splenomegaly, not elsewhere classified: Secondary | ICD-10-CM | POA: Diagnosis not present

## 2023-05-15 DIAGNOSIS — I10 Essential (primary) hypertension: Secondary | ICD-10-CM | POA: Diagnosis not present

## 2023-05-15 DIAGNOSIS — K828 Other specified diseases of gallbladder: Secondary | ICD-10-CM | POA: Diagnosis not present

## 2023-05-22 ENCOUNTER — Other Ambulatory Visit: Payer: Self-pay

## 2023-05-22 DIAGNOSIS — F331 Major depressive disorder, recurrent, moderate: Secondary | ICD-10-CM

## 2023-05-22 DIAGNOSIS — F411 Generalized anxiety disorder: Secondary | ICD-10-CM

## 2023-05-22 MED ORDER — SERTRALINE HCL 100 MG PO TABS
200.0000 mg | ORAL_TABLET | Freq: Every day | ORAL | 0 refills | Status: DC
Start: 2023-05-22 — End: 2023-06-01

## 2023-06-01 ENCOUNTER — Encounter: Payer: Self-pay | Admitting: Behavioral Health

## 2023-06-01 ENCOUNTER — Ambulatory Visit: Payer: Medicare HMO | Admitting: Behavioral Health

## 2023-06-01 DIAGNOSIS — F411 Generalized anxiety disorder: Secondary | ICD-10-CM

## 2023-06-01 DIAGNOSIS — F99 Mental disorder, not otherwise specified: Secondary | ICD-10-CM

## 2023-06-01 DIAGNOSIS — F331 Major depressive disorder, recurrent, moderate: Secondary | ICD-10-CM

## 2023-06-01 DIAGNOSIS — F329 Major depressive disorder, single episode, unspecified: Secondary | ICD-10-CM

## 2023-06-01 DIAGNOSIS — F5105 Insomnia due to other mental disorder: Secondary | ICD-10-CM | POA: Diagnosis not present

## 2023-06-01 MED ORDER — TRAZODONE HCL 50 MG PO TABS
50.0000 mg | ORAL_TABLET | Freq: Every evening | ORAL | 3 refills | Status: DC | PRN
Start: 2023-06-01 — End: 2023-08-18

## 2023-06-01 MED ORDER — SERTRALINE HCL 100 MG PO TABS
200.0000 mg | ORAL_TABLET | Freq: Every day | ORAL | 2 refills | Status: DC
Start: 2023-06-01 — End: 2023-08-18

## 2023-06-01 NOTE — Progress Notes (Signed)
[facility]  Patient ID: Joel Gross,  MRN: [id number]  PCP: Noni Saupe, MD  Date of Evaluation: 06/01/2023 Time spent:30 minutes  Chief Complaint:  Chief Complaint   Anxiety; Depression; Follow-up; Medication Refill; Patient Education     HISTORY/CURRENT STATUS: HPI 72 year old male presents to this office for follow up and medication management. Using rolator this visit. Says that Rexulti knocked him out and he could not wake up. Family called EMS. Spent four days in hospital but they gave him AB IV. He does not know why.  Still bothered by foot pain. Does not want to continue Rexulti.  Asking to go up on his Zoloft.  Says his depression today is 5/10 and anxiety 3/10. Reports sleeping 7-8 hours per night.  Has not used Delta 8 recently.  No mania or psychosis. No SI/HI.   Past psychiatric medication failures: Risperidone Flexeril amiltriptyline  Xanax Cymbalta Abilify Buspar Wellbutrin Vraylar-Reports caused foot drop Rexulti- reports hypersomnia, could not wake up, family called ambulance   Individual Medical History/ Review of Systems: Changes? :No   Allergies: Risperidone, Risperidone and related, and Hctz [hydrochlorothiazide]  Current Medications:  Current Outpatient Medications:    ALPRAZolam (XANAX) 0.25 MG tablet, TAKE 1 TO 2 TABLETS BY MOUTH TWICE DAILY AS NEEDED, Disp: 60 tablet, Rfl: 3   amLODipine (NORVASC) 10 MG tablet, Take 10 mg by mouth daily., Disp: , Rfl:    amLODipine (NORVASC) 10 MG tablet, Take by mouth. (Patient not taking: Reported on 03/06/2023), Disp: , Rfl:    atorvastatin (LIPITOR) 10 MG tablet, take 1/2 tablet by oral route  every day, Disp: , Rfl:    finasteride (PROSCAR) 5 MG tablet, Take 5 mg by mouth daily., Disp: , Rfl:    furosemide (LASIX) 20 MG tablet, Take 20 mg by mouth daily., Disp: , Rfl:    HYDROcodone-acetaminophen (NORCO) 7.5-325 MG tablet, Take 1 tablet by mouth 3 (three) times daily., Disp: , Rfl:     lisinopril (PRINIVIL,ZESTRIL) 40 MG tablet, Take 40 mg by mouth daily., Disp: , Rfl:    lisinopril (ZESTRIL) 40 MG tablet, Take by mouth. (Patient not taking: Reported on 03/06/2023), Disp: , Rfl:    Multiple Vitamin (MULTI-VITAMIN) tablet, , Disp: , Rfl:    ofloxacin (OCUFLOX) 0.3 % ophthalmic solution, , Disp: , Rfl:    oxyCODONE-acetaminophen (PERCOCET) 7.5-325 MG tablet, Take 1 tablet by mouth 3 (three) times daily as needed., Disp: , Rfl:    Potassium Chloride ER 20 MEQ TBCR, Take 5 tablets by mouth daily., Disp: , Rfl:    potassium chloride SA (K-DUR,KLOR-CON) 20 MEQ tablet, Take 20 mEq by mouth 2 (two) times daily., Disp: , Rfl:    pravastatin (PRAVACHOL) 20 MG tablet, Take 40 mg by mouth daily., Disp: , Rfl:    prednisoLONE acetate (PRED FORTE) 1 % ophthalmic suspension, Place into the left eye., Disp: , Rfl:    pregabalin (LYRICA) 200 MG capsule, Take 1 capsule (200 mg total) by mouth 3 (three) times daily., Disp: 90 capsule, Rfl: 0   pregabalin (LYRICA) 200 MG capsule, Take by mouth. (Patient not taking: Reported on 03/06/2023), Disp: , Rfl:    rosuvastatin (CRESTOR) 5 MG tablet, Take 5 mg by mouth at bedtime as needed., Disp: , Rfl:    sertraline (ZOLOFT) 100 MG tablet, Take 2 tablets (200 mg total) by mouth daily., Disp: 75 tablet, Rfl: 2   silodosin (RAPAFLO) 8 MG CAPS capsule, Take 1 capsule by mouth at bedtime., Disp: ,  Rfl:    tamsulosin (FLOMAX) 0.4 MG CAPS capsule, Take 0.4 mg by mouth daily., Disp: , Rfl:    tamsulosin (FLOMAX) 0.4 MG CAPS capsule, Take by mouth. (Patient not taking: Reported on 03/06/2023), Disp: , Rfl:    traZODone (DESYREL) 50 MG tablet, Take 1-2 tablets (50-100 mg total) by mouth at bedtime as needed., Disp: 60 tablet, Rfl: 3 Medication Side Effects: none  Family Medical/ Social History: Changes? No  MENTAL HEALTH EXAM:  There were no vitals taken for this visit.There is no height or weight on file to calculate BMI.  General Appearance: Casual, Neat, and Well  Groomed  Eye Contact:  Good  Speech:  Clear and Coherent  Volume:  Normal  Mood:  Anxious and Depressed  Affect:  Appropriate  Thought Process:  Coherent  Orientation:  Full (Time, Place, and Person)  Thought Content: Logical   Suicidal Thoughts:  No  Homicidal Thoughts:  No  Memory:  WNL  Judgement:  Good  Insight:  Good  Psychomotor Activity:  Normal  Concentration:  Concentration: Good  Recall:  Good  Fund of Knowledge: Good  Language: Good  Assets:  Desire for Improvement  ADL's:  Intact  Cognition: WNL  Prognosis:  Good    DIAGNOSES:    ICD-10-CM   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 Generalized anxiety disorder  F41.1 sertraline (ZOLOFT) 100 MG tablet    3. Treatment-resistant depression  F32.9     4. Major depressive disorder, recurrent episode, moderate (HCC)  F33.1 sertraline (ZOLOFT) 100 MG tablet      Receiving Psychotherapy: No    RECOMMENDATIONS:   Greater than 50% of 30  min. face to face time with patient was spent on counseling and coordination of care.  Reported medication sedated him to the point, family called EMS. Last month reported liking the medication but could not afford. Requesting increase of Zoloft. Had the conversation that we were running out of appropriate medication options.  Stopped  Rexulti To increase Zoloft to 250 mg daily.  Continue with Trazodone 50-100 mg at bedtime daily. To continue with xanax 0.25 mg twice daily as needed for severe anxiety Will report any side effects or worsening symptoms To follow up in 4weeks to reassess. Provided emergency contact information . Discussed potential metabolic side effects associated with atypical antipsychotics, as well as potential risk for movement side effects. Advised pt to contact office if movement side effects occur.   Discussed potential benefits, risk, and side effects of benzodiazepines to include potential risk of  tolerance and dependence, as well as possible drowsiness.  Advised patient not to drive if experiencing drowsiness and to take lowest possible effective dose to minimize risk of dependence and tolerance.  Reviewed PDMP     Joan Flores, NP

## 2023-06-15 DIAGNOSIS — E876 Hypokalemia: Secondary | ICD-10-CM | POA: Diagnosis not present

## 2023-06-15 DIAGNOSIS — D72829 Elevated white blood cell count, unspecified: Secondary | ICD-10-CM | POA: Diagnosis not present

## 2023-06-15 DIAGNOSIS — Z789 Other specified health status: Secondary | ICD-10-CM | POA: Diagnosis not present

## 2023-06-15 DIAGNOSIS — Z6826 Body mass index (BMI) 26.0-26.9, adult: Secondary | ICD-10-CM | POA: Diagnosis not present

## 2023-06-15 DIAGNOSIS — R7401 Elevation of levels of liver transaminase levels: Secondary | ICD-10-CM | POA: Diagnosis not present

## 2023-06-15 DIAGNOSIS — G63 Polyneuropathy in diseases classified elsewhere: Secondary | ICD-10-CM | POA: Diagnosis not present

## 2023-06-17 DIAGNOSIS — G609 Hereditary and idiopathic neuropathy, unspecified: Secondary | ICD-10-CM | POA: Diagnosis not present

## 2023-06-17 DIAGNOSIS — Z79891 Long term (current) use of opiate analgesic: Secondary | ICD-10-CM | POA: Diagnosis not present

## 2023-06-17 DIAGNOSIS — G629 Polyneuropathy, unspecified: Secondary | ICD-10-CM | POA: Diagnosis not present

## 2023-06-17 DIAGNOSIS — E876 Hypokalemia: Secondary | ICD-10-CM | POA: Diagnosis not present

## 2023-06-17 DIAGNOSIS — M5137 Other intervertebral disc degeneration, lumbosacral region: Secondary | ICD-10-CM | POA: Diagnosis not present

## 2023-06-18 DIAGNOSIS — L820 Inflamed seborrheic keratosis: Secondary | ICD-10-CM | POA: Diagnosis not present

## 2023-06-18 DIAGNOSIS — L538 Other specified erythematous conditions: Secondary | ICD-10-CM | POA: Diagnosis not present

## 2023-06-18 DIAGNOSIS — L821 Other seborrheic keratosis: Secondary | ICD-10-CM | POA: Diagnosis not present

## 2023-06-18 DIAGNOSIS — D225 Melanocytic nevi of trunk: Secondary | ICD-10-CM | POA: Diagnosis not present

## 2023-06-18 DIAGNOSIS — L570 Actinic keratosis: Secondary | ICD-10-CM | POA: Diagnosis not present

## 2023-06-18 DIAGNOSIS — L814 Other melanin hyperpigmentation: Secondary | ICD-10-CM | POA: Diagnosis not present

## 2023-07-07 ENCOUNTER — Other Ambulatory Visit: Payer: Self-pay | Admitting: Behavioral Health

## 2023-07-07 DIAGNOSIS — F411 Generalized anxiety disorder: Secondary | ICD-10-CM

## 2023-07-16 DIAGNOSIS — M5137 Other intervertebral disc degeneration, lumbosacral region: Secondary | ICD-10-CM | POA: Diagnosis not present

## 2023-07-16 DIAGNOSIS — G894 Chronic pain syndrome: Secondary | ICD-10-CM | POA: Diagnosis not present

## 2023-07-16 DIAGNOSIS — G609 Hereditary and idiopathic neuropathy, unspecified: Secondary | ICD-10-CM | POA: Diagnosis not present

## 2023-07-16 DIAGNOSIS — G629 Polyneuropathy, unspecified: Secondary | ICD-10-CM | POA: Diagnosis not present

## 2023-07-16 DIAGNOSIS — Z1389 Encounter for screening for other disorder: Secondary | ICD-10-CM | POA: Diagnosis not present

## 2023-07-16 DIAGNOSIS — Z79891 Long term (current) use of opiate analgesic: Secondary | ICD-10-CM | POA: Diagnosis not present

## 2023-07-29 DIAGNOSIS — N281 Cyst of kidney, acquired: Secondary | ICD-10-CM | POA: Diagnosis not present

## 2023-07-29 DIAGNOSIS — R972 Elevated prostate specific antigen [PSA]: Secondary | ICD-10-CM | POA: Diagnosis not present

## 2023-07-29 DIAGNOSIS — N401 Enlarged prostate with lower urinary tract symptoms: Secondary | ICD-10-CM | POA: Diagnosis not present

## 2023-07-29 DIAGNOSIS — R3915 Urgency of urination: Secondary | ICD-10-CM | POA: Diagnosis not present

## 2023-07-29 DIAGNOSIS — R3914 Feeling of incomplete bladder emptying: Secondary | ICD-10-CM | POA: Diagnosis not present

## 2023-07-30 ENCOUNTER — Ambulatory Visit: Payer: Medicare HMO | Admitting: Behavioral Health

## 2023-08-18 ENCOUNTER — Ambulatory Visit (INDEPENDENT_AMBULATORY_CARE_PROVIDER_SITE_OTHER): Payer: Medicare HMO | Admitting: Behavioral Health

## 2023-08-18 ENCOUNTER — Encounter: Payer: Self-pay | Admitting: Behavioral Health

## 2023-08-18 DIAGNOSIS — F99 Mental disorder, not otherwise specified: Secondary | ICD-10-CM | POA: Diagnosis not present

## 2023-08-18 DIAGNOSIS — F331 Major depressive disorder, recurrent, moderate: Secondary | ICD-10-CM | POA: Diagnosis not present

## 2023-08-18 DIAGNOSIS — F411 Generalized anxiety disorder: Secondary | ICD-10-CM

## 2023-08-18 DIAGNOSIS — F5105 Insomnia due to other mental disorder: Secondary | ICD-10-CM | POA: Diagnosis not present

## 2023-08-18 MED ORDER — SERTRALINE HCL 100 MG PO TABS: 250.0000 mg | ORAL_TABLET | Freq: Every day | ORAL | 3 refills | Status: DC

## 2023-08-18 MED ORDER — ALPRAZOLAM 0.25 MG PO TABS
ORAL_TABLET | ORAL | 3 refills | Status: DC
Start: 2023-08-18 — End: 2023-11-11

## 2023-08-18 MED ORDER — TRAZODONE HCL 50 MG PO TABS
50.0000 mg | ORAL_TABLET | Freq: Every evening | ORAL | 3 refills | Status: DC | PRN
Start: 2023-08-18 — End: 2023-11-30

## 2023-08-18 NOTE — Progress Notes (Signed)
[facility]  Patient ID: Joel Gross,  MRN: [id number]  PCP: Noni Saupe, MD  Date of Evaluation: 08/18/2023 Time spent:30 minutes  Chief Complaint:  Chief Complaint   Depression; Anxiety; Follow-up; Medication Refill; Patient Education; Memory Loss     HISTORY/CURRENT STATUS: HPI 72 year old male presents to this office for follow up and medication management. Using a stand up rollator for ambulation. Still bothered by foot pain. Feels like the increase dose of Zoloft has helped his anxiety and depression. Says he has strong family support. He is now trying to walk outside several times per week.  Was hospitalized in July for metabolic encephalopathy.   He is requesting new medication alternatives that may help.  Says his depression today is 2/10 and anxiety 3/10. Reports sleeping 7-8 hours per night.  Has not used Delta 8 recently.  No mania or psychosis. No SI/HI.   Past psychiatric medication failures: Risperidone Flexeril amiltriptyline  Xanax Cymbalta Abilify Buspar Wellbutrin Vraylar-Reports caused foot drop      Individual Medical History/ Review of Systems: Changes? :No   Allergies: Risperidone, Risperidone and related, and Hctz [hydrochlorothiazide]  Current Medications:  Current Outpatient Medications:    ALPRAZolam (XANAX) 0.25 MG tablet, TAKE 1 TO 2 TABLETS BY MOUTH TWICE DAILY AS NEEDED, Disp: 60 tablet, Rfl: 0   amLODipine (NORVASC) 10 MG tablet, Take 10 mg by mouth daily., Disp: , Rfl:    amLODipine (NORVASC) 10 MG tablet, Take by mouth. (Patient not taking: Reported on 03/06/2023), Disp: , Rfl:    atorvastatin (LIPITOR) 10 MG tablet, take 1/2 tablet by oral route  every day, Disp: , Rfl:    finasteride (PROSCAR) 5 MG tablet, Take 5 mg by mouth daily., Disp: , Rfl:    furosemide (LASIX) 20 MG tablet, Take 20 mg by mouth daily., Disp: , Rfl:    HYDROcodone-acetaminophen (NORCO) 7.5-325 MG tablet, Take 1 tablet by mouth 3 (three) times  daily., Disp: , Rfl:    lisinopril (PRINIVIL,ZESTRIL) 40 MG tablet, Take 40 mg by mouth daily., Disp: , Rfl:    lisinopril (ZESTRIL) 40 MG tablet, Take by mouth. (Patient not taking: Reported on 03/06/2023), Disp: , Rfl:    Multiple Vitamin (MULTI-VITAMIN) tablet, , Disp: , Rfl:    ofloxacin (OCUFLOX) 0.3 % ophthalmic solution, , Disp: , Rfl:    oxyCODONE-acetaminophen (PERCOCET) 7.5-325 MG tablet, Take 1 tablet by mouth 3 (three) times daily as needed., Disp: , Rfl:    Potassium Chloride ER 20 MEQ TBCR, Take 5 tablets by mouth daily., Disp: , Rfl:    potassium chloride SA (K-DUR,KLOR-CON) 20 MEQ tablet, Take 20 mEq by mouth 2 (two) times daily., Disp: , Rfl:    pravastatin (PRAVACHOL) 20 MG tablet, Take 40 mg by mouth daily., Disp: , Rfl:    prednisoLONE acetate (PRED FORTE) 1 % ophthalmic suspension, Place into the left eye., Disp: , Rfl:    pregabalin (LYRICA) 200 MG capsule, Take 1 capsule (200 mg total) by mouth 3 (three) times daily., Disp: 90 capsule, Rfl: 0   pregabalin (LYRICA) 200 MG capsule, Take by mouth. (Patient not taking: Reported on 03/06/2023), Disp: , Rfl:    rosuvastatin (CRESTOR) 5 MG tablet, Take 5 mg by mouth at bedtime as needed., Disp: , Rfl:    sertraline (ZOLOFT) 100 MG tablet, Take 2 tablets (200 mg total) by mouth daily., Disp: 75 tablet, Rfl: 2   silodosin (RAPAFLO) 8 MG CAPS capsule, Take 1 capsule by mouth at bedtime.,  Disp: , Rfl:    tamsulosin (FLOMAX) 0.4 MG CAPS capsule, Take 0.4 mg by mouth daily., Disp: , Rfl:    tamsulosin (FLOMAX) 0.4 MG CAPS capsule, Take by mouth. (Patient not taking: Reported on 03/06/2023), Disp: , Rfl:    traZODone (DESYREL) 50 MG tablet, Take 1-2 tablets (50-100 mg total) by mouth at bedtime as needed., Disp: 60 tablet, Rfl: 3 Medication Side Effects: none  Family Medical/ Social History: Changes? No  MENTAL HEALTH EXAM:  There were no vitals taken for this visit.There is no height or weight on file to calculate BMI.  General  Appearance: Casual, Neat, and Well Groomed  Eye Contact:  Good  Speech:  Clear and Coherent  Volume:  Normal  Mood:  NA  Affect:  Appropriate  Thought Process:  Coherent  Orientation:  Full (Time, Place, and Person)  Thought Content: Logical   Suicidal Thoughts:  No  Homicidal Thoughts:  No  Memory:  WNL  Judgement:  Good  Insight:  Good  Psychomotor Activity:  Normal  Concentration:  Concentration: Good  Recall:  Good  Fund of Knowledge: Good  Language: Good  Assets:  Desire for Improvement  ADL's:  Intact  Cognition: WNL  Prognosis:  Good    DIAGNOSES:    ICD-10-CM   1. Generalized anxiety disorder  F41.1     2. Major depressive disorder, recurrent episode, moderate (HCC)  F33.1     3. Insomnia due to other mental disorder  F51.05    F99       Receiving Psychotherapy: No    RECOMMENDATIONS:   Greater than 50% of 30  min. face to face time with patient was spent on counseling and coordination of care.  Depression has improved. He  is getting out walking. Strong family support. Wife brought him to appt today. His son visits frequently and helps him with task around the house.  He is requesting no medication changes today.  To continue Zoloft to 250 mg daily.  Continue with Trazodone 50-100 mg at bedtime daily. To continue with xanax 0.25 mg twice daily as needed for severe anxiety Will report any side effects or worsening symptoms To follow up in 12  weeks to reassess. Provided emergency contact information . Discussed potential metabolic side effects associated with atypical antipsychotics, as well as potential risk for movement side effects. Advised pt to contact office if movement side effects occur.   Discussed potential benefits, risk, and side effects of benzodiazepines to include potential risk of tolerance and dependence, as well as possible drowsiness.  Advised patient not to drive if experiencing drowsiness and to take lowest possible effective dose to  minimize risk of dependence and tolerance.  Reviewed PDMP          Joan Flores, NP

## 2023-08-23 ENCOUNTER — Telehealth: Payer: Self-pay

## 2023-08-23 NOTE — Telephone Encounter (Signed)
Prior Authorization submitted for Sertraline 100 mg #75/30 day with [employer] Part D approval received effective through 11/03/2023.

## 2023-08-25 DIAGNOSIS — M25562 Pain in left knee: Secondary | ICD-10-CM | POA: Diagnosis not present

## 2023-08-25 DIAGNOSIS — Z6826 Body mass index (BMI) 26.0-26.9, adult: Secondary | ICD-10-CM | POA: Diagnosis not present

## 2023-08-25 DIAGNOSIS — M25462 Effusion, left knee: Secondary | ICD-10-CM | POA: Diagnosis not present

## 2023-08-25 DIAGNOSIS — M1712 Unilateral primary osteoarthritis, left knee: Secondary | ICD-10-CM | POA: Diagnosis not present

## 2023-09-09 ENCOUNTER — Ambulatory Visit: Payer: Medicare HMO | Admitting: Physician Assistant

## 2023-09-16 DIAGNOSIS — G629 Polyneuropathy, unspecified: Secondary | ICD-10-CM | POA: Diagnosis not present

## 2023-09-16 DIAGNOSIS — G894 Chronic pain syndrome: Secondary | ICD-10-CM | POA: Diagnosis not present

## 2023-09-16 DIAGNOSIS — Z1389 Encounter for screening for other disorder: Secondary | ICD-10-CM | POA: Diagnosis not present

## 2023-09-16 DIAGNOSIS — G609 Hereditary and idiopathic neuropathy, unspecified: Secondary | ICD-10-CM | POA: Diagnosis not present

## 2023-09-16 DIAGNOSIS — Z79891 Long term (current) use of opiate analgesic: Secondary | ICD-10-CM | POA: Diagnosis not present

## 2023-09-21 DIAGNOSIS — M25562 Pain in left knee: Secondary | ICD-10-CM | POA: Diagnosis not present

## 2023-09-21 DIAGNOSIS — M1712 Unilateral primary osteoarthritis, left knee: Secondary | ICD-10-CM | POA: Diagnosis not present

## 2023-09-21 DIAGNOSIS — M25462 Effusion, left knee: Secondary | ICD-10-CM | POA: Diagnosis not present

## 2023-09-21 DIAGNOSIS — Z6826 Body mass index (BMI) 26.0-26.9, adult: Secondary | ICD-10-CM | POA: Diagnosis not present

## 2023-09-29 ENCOUNTER — Other Ambulatory Visit: Payer: Self-pay | Admitting: Behavioral Health

## 2023-09-29 DIAGNOSIS — F331 Major depressive disorder, recurrent, moderate: Secondary | ICD-10-CM

## 2023-09-29 DIAGNOSIS — F411 Generalized anxiety disorder: Secondary | ICD-10-CM

## 2023-10-02 ENCOUNTER — Other Ambulatory Visit: Payer: Self-pay | Admitting: Behavioral Health

## 2023-10-02 DIAGNOSIS — F331 Major depressive disorder, recurrent, moderate: Secondary | ICD-10-CM

## 2023-10-02 DIAGNOSIS — F411 Generalized anxiety disorder: Secondary | ICD-10-CM

## 2023-10-12 DIAGNOSIS — M1712 Unilateral primary osteoarthritis, left knee: Secondary | ICD-10-CM | POA: Diagnosis not present

## 2023-10-19 DIAGNOSIS — M1712 Unilateral primary osteoarthritis, left knee: Secondary | ICD-10-CM | POA: Diagnosis not present

## 2023-10-21 DIAGNOSIS — Z79891 Long term (current) use of opiate analgesic: Secondary | ICD-10-CM | POA: Diagnosis not present

## 2023-10-21 DIAGNOSIS — G629 Polyneuropathy, unspecified: Secondary | ICD-10-CM | POA: Diagnosis not present

## 2023-10-21 DIAGNOSIS — G609 Hereditary and idiopathic neuropathy, unspecified: Secondary | ICD-10-CM | POA: Diagnosis not present

## 2023-10-21 DIAGNOSIS — Z1389 Encounter for screening for other disorder: Secondary | ICD-10-CM | POA: Diagnosis not present

## 2023-10-21 DIAGNOSIS — G894 Chronic pain syndrome: Secondary | ICD-10-CM | POA: Diagnosis not present

## 2023-10-26 DIAGNOSIS — M1712 Unilateral primary osteoarthritis, left knee: Secondary | ICD-10-CM | POA: Diagnosis not present

## 2023-11-05 DIAGNOSIS — M1712 Unilateral primary osteoarthritis, left knee: Secondary | ICD-10-CM | POA: Diagnosis not present

## 2023-11-05 DIAGNOSIS — Z6826 Body mass index (BMI) 26.0-26.9, adult: Secondary | ICD-10-CM | POA: Diagnosis not present

## 2023-11-10 ENCOUNTER — Telehealth: Payer: Self-pay | Admitting: Behavioral Health

## 2023-11-10 NOTE — Telephone Encounter (Signed)
 Patient lvm stating after visiting  Pain Clinic he was advised to make adjustments to his Xanax  and Percocet medication. He stated a msg was left on yesterday with no follow up call. He is company secretary.   [phone] was left for the message on today.

## 2023-11-10 NOTE — Telephone Encounter (Signed)
 Patient said the pain clinic will not continue to prescribe Percocet as long as he is taking alprazolam . He wants to stay at the pain clinic because it is near his house, and he wants to wean off the alprazolam . He is asking for a new Rx for 1.5 tabs BID instead of 1-2 BID to show that he is attempting to wean off the alprazolam .  He last filled 11/26 for #60.

## 2023-11-11 ENCOUNTER — Other Ambulatory Visit: Payer: Self-pay

## 2023-11-11 DIAGNOSIS — F411 Generalized anxiety disorder: Secondary | ICD-10-CM

## 2023-11-11 NOTE — Telephone Encounter (Signed)
 Ok, Im ok with him changing with that RX. Please pend. However, he needs to be prescribed Narcan for safety to have at home if he is taking both of these medications together. He can inquire about with his PCP or pharmacist. He will have to understand that in about 1 month we would reduce his xanax  to 1 tablet twice daily, and then reduce again the next month if possible.

## 2023-11-12 MED ORDER — ALPRAZOLAM 0.25 MG PO TABS: ORAL_TABLET | ORAL | 1 refills | Status: DC

## 2023-11-12 NOTE — Telephone Encounter (Signed)
 Pended new Rx for 1 to 1.5 tablets BID PRN to WM. Notation made to cancel previous scripts for 1-2.

## 2023-11-16 NOTE — Telephone Encounter (Signed)
 Joel Gross called at 10:35 to report that the prescription for Xanax  should only be for 1/day NOT BID.  He said the pain clinic will only approve as 1/ day.  He said he wasn't taking as BID anyway.  He has not picked up the prescription sent in.  Please correct it to be only 1/day.

## 2023-11-16 NOTE — Telephone Encounter (Signed)
 Called patient and he said he looked at the bottle wrong. He reports he doesn't need anything changed.

## 2023-11-18 ENCOUNTER — Ambulatory Visit: Payer: Medicare HMO | Admitting: Behavioral Health

## 2023-11-30 ENCOUNTER — Ambulatory Visit: Payer: Medicare HMO | Admitting: Behavioral Health

## 2023-11-30 ENCOUNTER — Encounter: Payer: Self-pay | Admitting: Behavioral Health

## 2023-11-30 DIAGNOSIS — F5105 Insomnia due to other mental disorder: Secondary | ICD-10-CM

## 2023-11-30 DIAGNOSIS — F411 Generalized anxiety disorder: Secondary | ICD-10-CM | POA: Diagnosis not present

## 2023-11-30 DIAGNOSIS — F331 Major depressive disorder, recurrent, moderate: Secondary | ICD-10-CM | POA: Diagnosis not present

## 2023-11-30 DIAGNOSIS — F99 Mental disorder, not otherwise specified: Secondary | ICD-10-CM

## 2023-11-30 MED ORDER — TRAZODONE HCL 50 MG PO TABS: 50.0000 mg | ORAL_TABLET | Freq: Every evening | ORAL | 3 refills | Status: DC | PRN

## 2023-11-30 MED ORDER — SERTRALINE HCL 100 MG PO TABS: 250.0000 mg | ORAL_TABLET | Freq: Every day | ORAL | 3 refills | Status: DC

## 2023-11-30 NOTE — Progress Notes (Signed)
[facility]  Patient ID: Joel Gross,  MRN: [id number]  PCP: Noni Saupe, MD  Date of Evaluation: 11/30/2023 Time spent:30 minutes  Chief Complaint:   HISTORY/CURRENT STATUS: HPI 73 year old male presents to this office for follow up and medication management. Using wheelchair du to needing knee replacement and hurting to bad to walk. Is following up with ortho. Still bothered by foot pain. Feels like the increase dose of Zoloft has helped his anxiety and depression. Says he has strong family support. He is now trying to walk outside several times per week.  Was hospitalized in July for metabolic encephalopathy.   He is requesting new medication alternatives that may help.  Says his depression today is 2/10 and anxiety 3/10. Reports sleeping 7-8 hours per night.  Has not used Delta 8 recently.  No mania or psychosis. No SI/HI.   Past psychiatric medication failures: Risperidone Flexeril amiltriptyline  Xanax Cymbalta Abilify Buspar Wellbutrin Vraylar-Reports caused foot drop       Individual Medical History/ Review of Systems: Changes? :No   Allergies: Risperidone, Risperidone and related, and Hctz [hydrochlorothiazide]  Current Medications:  Current Outpatient Medications:    ALPRAZolam (XANAX) 0.25 MG tablet, Take 1 to 1.5 tablets BID PRN, Disp: 60 tablet, Rfl: 1   amLODipine (NORVASC) 10 MG tablet, Take 10 mg by mouth daily., Disp: , Rfl:    amLODipine (NORVASC) 10 MG tablet, Take by mouth. (Patient not taking: Reported on 03/06/2023), Disp: , Rfl:    atorvastatin (LIPITOR) 10 MG tablet, take 1/2 tablet by oral route  every day, Disp: , Rfl:    finasteride (PROSCAR) 5 MG tablet, Take 5 mg by mouth daily., Disp: , Rfl:    furosemide (LASIX) 20 MG tablet, Take 20 mg by mouth daily., Disp: , Rfl:    HYDROcodone-acetaminophen (NORCO) 7.5-325 MG tablet, Take 1 tablet by mouth 3 (three) times daily., Disp: , Rfl:    lisinopril (PRINIVIL,ZESTRIL) 40 MG  tablet, Take 40 mg by mouth daily., Disp: , Rfl:    lisinopril (ZESTRIL) 40 MG tablet, Take by mouth. (Patient not taking: Reported on 03/06/2023), Disp: , Rfl:    Multiple Vitamin (MULTI-VITAMIN) tablet, , Disp: , Rfl:    ofloxacin (OCUFLOX) 0.3 % ophthalmic solution, , Disp: , Rfl:    oxyCODONE-acetaminophen (PERCOCET) 7.5-325 MG tablet, Take 1 tablet by mouth 3 (three) times daily as needed., Disp: , Rfl:    Potassium Chloride ER 20 MEQ TBCR, Take 5 tablets by mouth daily., Disp: , Rfl:    potassium chloride SA (K-DUR,KLOR-CON) 20 MEQ tablet, Take 20 mEq by mouth 2 (two) times daily., Disp: , Rfl:    pravastatin (PRAVACHOL) 20 MG tablet, Take 40 mg by mouth daily., Disp: , Rfl:    prednisoLONE acetate (PRED FORTE) 1 % ophthalmic suspension, Place into the left eye., Disp: , Rfl:    pregabalin (LYRICA) 200 MG capsule, Take 1 capsule (200 mg total) by mouth 3 (three) times daily., Disp: 90 capsule, Rfl: 0   pregabalin (LYRICA) 200 MG capsule, Take by mouth. (Patient not taking: Reported on 03/06/2023), Disp: , Rfl:    rosuvastatin (CRESTOR) 5 MG tablet, Take 5 mg by mouth at bedtime as needed., Disp: , Rfl:    sertraline (ZOLOFT) 100 MG tablet, Take 2.5 tablets (250 mg total) by mouth daily., Disp: 75 tablet, Rfl: 3   silodosin (RAPAFLO) 8 MG CAPS capsule, Take 1 capsule by mouth at bedtime., Disp: , Rfl:    tamsulosin (FLOMAX)  0.4 MG CAPS capsule, Take 0.4 mg by mouth daily., Disp: , Rfl:    tamsulosin (FLOMAX) 0.4 MG CAPS capsule, Take by mouth. (Patient not taking: Reported on 03/06/2023), Disp: , Rfl:    traZODone (DESYREL) 50 MG tablet, Take 1-2 tablets (50-100 mg total) by mouth at bedtime as needed., Disp: 60 tablet, Rfl: 3 Medication Side Effects: none  Family Medical/ Social History: Changes? No  MENTAL HEALTH EXAM:  There were no vitals taken for this visit.There is no height or weight on file to calculate BMI.  General Appearance: Casual  Eye Contact:  Good  Speech:  Clear and  Coherent  Volume:  Normal  Mood:  NA  Affect:  Appropriate  Thought Process:  Coherent  Orientation:  Full (Time, Place, and Person)  Thought Content: Logical   Suicidal Thoughts:  No  Homicidal Thoughts:  No  Memory:  WNL  Judgement:  Good  Insight:  Good  Psychomotor Activity:  Normal  Concentration:  Concentration: Good  Recall:  Good  Fund of Knowledge: Good  Language: Good  Assets:  Desire for Improvement  ADL's:  Intact  Cognition: WNL  Prognosis:  Good    DIAGNOSES:    ICD-10-CM   1. Generalized anxiety disorder  F41.1 sertraline (ZOLOFT) 100 MG tablet    2. Major depressive disorder, recurrent episode, moderate (HCC)  F33.1 sertraline (ZOLOFT) 100 MG tablet    3. Insomnia due to other mental disorder  F51.05 traZODone (DESYREL) 50 MG tablet   F99       Receiving Psychotherapy: No    RECOMMENDATIONS:  Greater than 50% of 30  min. face to face time with patient was spent on counseling and coordination of care.  Depression has improved. He is in wheel chair due to needing knee replacement. Strong family support. Wife brought him to appt today. His son visits frequently and helps him with task around the house.  He is requesting no medication changes today.  To continue Zoloft to 250 mg daily.  Continue with Trazodone 50-100 mg at bedtime daily. To continue with xanax 0.25 mg twice daily as needed for severe anxiety Will report any side effects or worsening symptoms To follow up in 12  weeks to reassess. Provided emergency contact information . Discussed potential metabolic side effects associated with atypical antipsychotics, as well as potential risk for movement side effects. Advised pt to contact office if movement side effects occur.   Discussed potential benefits, risk, and side effects of benzodiazepines to include potential risk of tolerance and dependence, as well as possible drowsiness.  Advised patient not to drive if experiencing drowsiness and to take  lowest possible effective dose to minimize risk of dependence and tolerance.  Reviewed PDMP          Joan Flores, NP

## 2023-12-15 DIAGNOSIS — M170 Bilateral primary osteoarthritis of knee: Secondary | ICD-10-CM | POA: Diagnosis not present

## 2024-01-01 LAB — LAB REPORT - SCANNED: EGFR: 60

## 2024-01-11 ENCOUNTER — Ambulatory Visit: Admitting: Podiatry

## 2024-01-12 DIAGNOSIS — R972 Elevated prostate specific antigen [PSA]: Secondary | ICD-10-CM | POA: Diagnosis not present

## 2024-01-12 DIAGNOSIS — N3946 Mixed incontinence: Secondary | ICD-10-CM | POA: Diagnosis not present

## 2024-01-12 DIAGNOSIS — R3915 Urgency of urination: Secondary | ICD-10-CM | POA: Diagnosis not present

## 2024-01-12 DIAGNOSIS — N401 Enlarged prostate with lower urinary tract symptoms: Secondary | ICD-10-CM | POA: Diagnosis not present

## 2024-01-12 DIAGNOSIS — R3914 Feeling of incomplete bladder emptying: Secondary | ICD-10-CM | POA: Diagnosis not present

## 2024-01-13 ENCOUNTER — Telehealth: Payer: Self-pay | Admitting: Behavioral Health

## 2024-01-13 ENCOUNTER — Other Ambulatory Visit: Payer: Self-pay

## 2024-01-13 DIAGNOSIS — F411 Generalized anxiety disorder: Secondary | ICD-10-CM

## 2024-01-13 NOTE — Telephone Encounter (Signed)
 LF 2/13

## 2024-01-13 NOTE — Telephone Encounter (Signed)
 Next visit is 02/29/24. Joel Gross has been taking Xanax 1-1/2 twice a day but says it needs to be 1-1/2 per day. He needs a refill for #45 pills. He says the [facility] he goes to is requesting it be reduced. He states he is being seen at the Pain Clinic this coming Monday. Phone number is [phone].  [facility] [address] [place] - [address]   Phone: [phone]  Fax: [phone]

## 2024-01-14 MED ORDER — ALPRAZOLAM 0.25 MG PO TABS: ORAL_TABLET | ORAL | 1 refills | Status: DC

## 2024-01-14 NOTE — Telephone Encounter (Signed)
 Pended Rx for 1-1/2 tablets daily, #45.

## 2024-01-18 ENCOUNTER — Encounter: Payer: Self-pay | Admitting: Podiatry

## 2024-01-18 ENCOUNTER — Ambulatory Visit: Admitting: Podiatry

## 2024-01-18 DIAGNOSIS — M79675 Pain in left toe(s): Secondary | ICD-10-CM | POA: Diagnosis not present

## 2024-01-18 DIAGNOSIS — G894 Chronic pain syndrome: Secondary | ICD-10-CM | POA: Diagnosis not present

## 2024-01-18 DIAGNOSIS — B351 Tinea unguium: Secondary | ICD-10-CM | POA: Diagnosis not present

## 2024-01-18 DIAGNOSIS — G629 Polyneuropathy, unspecified: Secondary | ICD-10-CM | POA: Diagnosis not present

## 2024-01-18 DIAGNOSIS — M79674 Pain in right toe(s): Secondary | ICD-10-CM

## 2024-01-18 DIAGNOSIS — G609 Hereditary and idiopathic neuropathy, unspecified: Secondary | ICD-10-CM | POA: Diagnosis not present

## 2024-01-18 DIAGNOSIS — Z79891 Long term (current) use of opiate analgesic: Secondary | ICD-10-CM | POA: Diagnosis not present

## 2024-01-18 NOTE — Progress Notes (Unsigned)
  Subjective:  Patient ID: Joel Gross, male    DOB: 25-Dec-1950,  MRN: [id number]  Chief Complaint  Patient presents with   Nail Problem    Right great toenail is thick and discolored.  He also has some pain in this toe. It looks as if the nail was ingrown at one time, but he stubbed it a long time ago and now the nail is not right. Not diabetic, and no anti coag.     73 y.o. male presents with the above complaint. History confirmed with patient. Patient presenting with pain related to dystrophic thickened elongated nails. Patient is unable to trim own nails related to nail dystrophy and/or mobility issues.  He does use a walker at baseline.  Has history of idiopathic neuropathy.  Patient does not have a history of T2DM.  He is potentially wanting to discuss removal of some of the more painful toenails. Objective:  Physical Exam: warm to cool, good capillary refill, pedal skin atrophic, diminished pedal hair growth nail exam onychomycosis of the toenails, onycholysis, dystrophic nails, and deformity to right first and second toenail causing pain, old subungual hematoma present subfifth toenail right foot. DP pulses palpable, PT pulses palpable, and protective sensation absent Left Foot:  Pain with palpation of nails due to elongation and dystrophic growth.  Right Foot: Pain with palpation of nails due to elongation and dystrophic growth.   He does have dorsal abrasions bilateral midfoot which the patient states comes from rubbing when using rehabilitation device  Gait: Antalgic, assisted requiring walker device  Assessment:   1. Pain due to onychomycosis of toenails of both feet   2. Idiopathic neuropathy      Plan:  Patient was evaluated and treated and all questions answered.  #Onychomycosis with pain  -Nails palliatively debrided as below. -Educated on self-care -Patient interested in having some of the painful toenails removed, likely the first and second toenails right foot. He  will follow up in approximately 2 weeks for this. -Patient would also benefit from routine footcare.  He can follow-up in approximately 3 months for regular nail debridement.  Procedure: Nail Debridement Rationale: Pain Type of Debridement: manual, sharp debridement. Instrumentation: Nail nipper, rotary burr. Number of Nails: 10  Return in about 2 weeks (around 02/01/2024).         Bronwen Betters, DPM [facility] / [facility]

## 2024-01-20 DIAGNOSIS — M25562 Pain in left knee: Secondary | ICD-10-CM | POA: Diagnosis not present

## 2024-01-20 DIAGNOSIS — R2689 Other abnormalities of gait and mobility: Secondary | ICD-10-CM | POA: Diagnosis not present

## 2024-01-22 DIAGNOSIS — R2689 Other abnormalities of gait and mobility: Secondary | ICD-10-CM | POA: Diagnosis not present

## 2024-01-22 DIAGNOSIS — M25562 Pain in left knee: Secondary | ICD-10-CM | POA: Diagnosis not present

## 2024-01-27 DIAGNOSIS — R2689 Other abnormalities of gait and mobility: Secondary | ICD-10-CM | POA: Diagnosis not present

## 2024-01-27 DIAGNOSIS — M25562 Pain in left knee: Secondary | ICD-10-CM | POA: Diagnosis not present

## 2024-01-29 DIAGNOSIS — R2689 Other abnormalities of gait and mobility: Secondary | ICD-10-CM | POA: Diagnosis not present

## 2024-01-29 DIAGNOSIS — M25562 Pain in left knee: Secondary | ICD-10-CM | POA: Diagnosis not present

## 2024-02-02 ENCOUNTER — Ambulatory Visit: Admitting: Podiatry

## 2024-02-02 ENCOUNTER — Encounter: Payer: Self-pay | Admitting: Podiatry

## 2024-02-02 DIAGNOSIS — L603 Nail dystrophy: Secondary | ICD-10-CM

## 2024-02-02 DIAGNOSIS — M79674 Pain in right toe(s): Secondary | ICD-10-CM

## 2024-02-02 DIAGNOSIS — B351 Tinea unguium: Secondary | ICD-10-CM | POA: Diagnosis not present

## 2024-02-02 DIAGNOSIS — M79675 Pain in left toe(s): Secondary | ICD-10-CM | POA: Diagnosis not present

## 2024-02-02 NOTE — Patient Instructions (Signed)
 Place 1/4 cup of epsom salts in a quart of warm tap water.  Submerge your foot or feet in the solution and soak for 20 minutes.  This soak should be done twice a day.  Next, remove your foot or feet from solution, blot dry the affected area. Apply ointment and cover if instructed by your doctor.   IF YOUR SKIN BECOMES IRRITATED WHILE USING THESE INSTRUCTIONS, IT IS OKAY TO SWITCH TO  WHITE VINEGAR AND WATER.  As another alternative soak, you may use antibacterial soap and water.  Monitor for any signs/symptoms of infection. Call the office immediately if any occur or go directly to the emergency room. Call with any questions/concerns.  You may apply a small amount of Neosporin to the sites for the first 5 to 7 days.  After that time continue soaking and bandaging until a dry scab starts to form.

## 2024-02-02 NOTE — Progress Notes (Unsigned)
 Subjective:  Patient ID: Joel Gross, male    DOB: 1950-11-08,  MRN: [id number]  Joel Gross presents to clinic today for:  Chief Complaint  Patient presents with   Ingrown Toenail    He is here for ingrown to the right great toe. Stated that it was discussed to remove the nail. He has some scrapes that are new on th great toe and the 2nd  and is worried you will not want to do the procedure. Not diabetic and no anti coag.   Patient follows up today to discuss PNA of the right first and second toenails which cause some significant pain due to their dystrophic growth.  He would like to proceed with this today.  PCP is Nabors, Scherrie November, DO.  Allergies  Allergen Reactions   Risperidone Other (See Comments)    parkinsonism   Risperidone And Related Other (See Comments)    parkinsonism   Hctz [Hydrochlorothiazide]     Review of Systems: Negative except as noted in the HPI.  Objective:  There were no vitals filed for this visit.  Joel Gross is a pleasant 73 y.o. male in NAD. AAO x 3.  Vascular Examination: Capillary refill time is less than 3 seconds to toes bilateral. Palpable pedal pulses b/l LE. Digital hair present b/l. No pedal edema b/l. Skin temperature gradient WNL b/l. No varicosities b/l. No cyanosis or clubbing noted b/l.   Dermatological Examination: Pain on palpation of the right first and second toenails.  They are dystrophic in nature.  The affected toenails are yellow, thickened, discolored and mycotic in appearance.  No signs of acute bacterial infection.  Previously noted abrasion to dorsal midfoot is healed.  There are some superficial abrasions noted to the right second toe medial aspect and dorsal medial right first toe IPJ level.  Neurological Examination: Protective sensation intact with Semmes-Weinstein 10 gram monofilament b/l LE. Vibratory sensation intact b/l LE.      No data to display           Assessment/Plan: 1. Pain due to  onychomycosis of toenails of both feet   2. Nail dystrophy     No orders of the defined types were placed in this encounter.   Discussed patient's condition today.  After obtaining patient consent, the right first and second toes were anesthetized with a 50:50 mixture of 1% lidocaine plain and 0.5% bupivacaine plain for a total of 3cc's administered to each toe.  Upon confirmation of anesthesia, a freer elevator was utilized to free the nail plate from the nail bed of the right first and second toe.  The nail plate was then avulsed proximal to the eponychium and removed in toto.  The area was inspected for any remaining spicules.  A chemical matrixectomy was performed with NaOH and neutralized with acetic acid solution.  Antibiotic ointment and a DSD were applied, followed by a Coban dressing.  Patient tolerated the anesthetic and procedure well and will f/u in 2-3 weeks for recheck.  Patient given post-procedure instructions for daily 15-minute Epsom salt soaks, antibiotic ointment and daily use of Bandaids until toe starts to dry / form eschar.   They may also treat the abrasions of the right first and second toes with antibiotic ointment and cover with bandage.   Return in about 2 weeks (around 02/16/2024) for Nail Check.   Carah Barrientes L. Marchia Bond, AACFAS [facility]     [address]  [address], [place] [address]                Office [phone]  Fax [phone]

## 2024-02-03 DIAGNOSIS — R2689 Other abnormalities of gait and mobility: Secondary | ICD-10-CM | POA: Diagnosis not present

## 2024-02-03 DIAGNOSIS — M25562 Pain in left knee: Secondary | ICD-10-CM | POA: Diagnosis not present

## 2024-02-05 DIAGNOSIS — R2689 Other abnormalities of gait and mobility: Secondary | ICD-10-CM | POA: Diagnosis not present

## 2024-02-05 DIAGNOSIS — M25562 Pain in left knee: Secondary | ICD-10-CM | POA: Diagnosis not present

## 2024-02-08 DIAGNOSIS — M25562 Pain in left knee: Secondary | ICD-10-CM | POA: Diagnosis not present

## 2024-02-08 DIAGNOSIS — R2689 Other abnormalities of gait and mobility: Secondary | ICD-10-CM | POA: Diagnosis not present

## 2024-02-10 DIAGNOSIS — M25562 Pain in left knee: Secondary | ICD-10-CM | POA: Diagnosis not present

## 2024-02-10 DIAGNOSIS — R2689 Other abnormalities of gait and mobility: Secondary | ICD-10-CM | POA: Diagnosis not present

## 2024-02-11 DIAGNOSIS — R4789 Other speech disturbances: Secondary | ICD-10-CM | POA: Diagnosis not present

## 2024-02-11 DIAGNOSIS — I10 Essential (primary) hypertension: Secondary | ICD-10-CM | POA: Diagnosis not present

## 2024-02-11 DIAGNOSIS — R4781 Slurred speech: Secondary | ICD-10-CM | POA: Diagnosis not present

## 2024-02-11 DIAGNOSIS — B9689 Other specified bacterial agents as the cause of diseases classified elsewhere: Secondary | ICD-10-CM | POA: Diagnosis not present

## 2024-02-11 DIAGNOSIS — R2990 Unspecified symptoms and signs involving the nervous system: Secondary | ICD-10-CM | POA: Diagnosis not present

## 2024-02-11 DIAGNOSIS — R4182 Altered mental status, unspecified: Secondary | ICD-10-CM | POA: Diagnosis not present

## 2024-02-11 DIAGNOSIS — R9431 Abnormal electrocardiogram [ECG] [EKG]: Secondary | ICD-10-CM | POA: Diagnosis not present

## 2024-02-11 DIAGNOSIS — R531 Weakness: Secondary | ICD-10-CM | POA: Diagnosis not present

## 2024-02-11 DIAGNOSIS — G9349 Other encephalopathy: Secondary | ICD-10-CM | POA: Diagnosis not present

## 2024-02-11 DIAGNOSIS — G459 Transient cerebral ischemic attack, unspecified: Secondary | ICD-10-CM | POA: Diagnosis not present

## 2024-02-11 DIAGNOSIS — N390 Urinary tract infection, site not specified: Secondary | ICD-10-CM | POA: Diagnosis not present

## 2024-02-11 DIAGNOSIS — I499 Cardiac arrhythmia, unspecified: Secondary | ICD-10-CM | POA: Diagnosis not present

## 2024-02-12 DIAGNOSIS — R4781 Slurred speech: Secondary | ICD-10-CM | POA: Diagnosis not present

## 2024-02-12 DIAGNOSIS — G459 Transient cerebral ischemic attack, unspecified: Secondary | ICD-10-CM | POA: Diagnosis not present

## 2024-02-12 DIAGNOSIS — N390 Urinary tract infection, site not specified: Secondary | ICD-10-CM | POA: Diagnosis not present

## 2024-02-12 DIAGNOSIS — B9689 Other specified bacterial agents as the cause of diseases classified elsewhere: Secondary | ICD-10-CM | POA: Diagnosis not present

## 2024-02-12 DIAGNOSIS — G9349 Other encephalopathy: Secondary | ICD-10-CM | POA: Diagnosis not present

## 2024-02-13 DIAGNOSIS — N390 Urinary tract infection, site not specified: Secondary | ICD-10-CM | POA: Diagnosis not present

## 2024-02-13 DIAGNOSIS — G9349 Other encephalopathy: Secondary | ICD-10-CM | POA: Diagnosis not present

## 2024-02-13 DIAGNOSIS — I493 Ventricular premature depolarization: Secondary | ICD-10-CM | POA: Diagnosis not present

## 2024-02-13 DIAGNOSIS — B9689 Other specified bacterial agents as the cause of diseases classified elsewhere: Secondary | ICD-10-CM | POA: Diagnosis not present

## 2024-02-14 DIAGNOSIS — G9349 Other encephalopathy: Secondary | ICD-10-CM | POA: Diagnosis not present

## 2024-02-14 DIAGNOSIS — B9689 Other specified bacterial agents as the cause of diseases classified elsewhere: Secondary | ICD-10-CM | POA: Diagnosis not present

## 2024-02-14 DIAGNOSIS — N390 Urinary tract infection, site not specified: Secondary | ICD-10-CM | POA: Diagnosis not present

## 2024-02-15 ENCOUNTER — Ambulatory Visit: Admitting: Podiatry

## 2024-02-16 ENCOUNTER — Encounter: Payer: Self-pay | Admitting: Podiatry

## 2024-02-16 ENCOUNTER — Ambulatory Visit: Admitting: Podiatry

## 2024-02-16 DIAGNOSIS — L03031 Cellulitis of right toe: Secondary | ICD-10-CM | POA: Diagnosis not present

## 2024-02-16 DIAGNOSIS — L02611 Cutaneous abscess of right foot: Secondary | ICD-10-CM

## 2024-02-16 MED ORDER — MUPIROCIN 2 % EX OINT: 1.0000 | TOPICAL_OINTMENT | Freq: Two times a day (BID) | CUTANEOUS | 2 refills | Status: AC

## 2024-02-16 NOTE — Progress Notes (Signed)
       Subjective:  Patient ID: Joel Gross, male    DOB: 08/15/1951,  MRN: [id number]  Chief Complaint  Patient presents with   PNA Check    Nail check for the 1st and 2nd toes on the right foot. He just got out of the hospital on "Sunday and is on ABX currently. The hospital changed the bandage one time, and that's it The 1st toe looks ok, the 2nd has some redness and the skin is peeling a little. He has not been able to soak. He was admitted last Wed, and got out Sunday. Not diabetic and no anti coag.     Joel Gross presents to clinic today for f/u of right 1st and 2nd toenail total nail avulsion with chemical matrixectomy.  Unfortunately shortly afterwards, the patient was hospitalized for separate issue.  During that time he did not have regular dressing care or any soaking to the toes.  He was started on Augmentin for separate issue, total of 10 days, he has 5 more days left of his course.  Denies any pain due to neuropathy, right first toe seems to be healing well, they have noticed some redness and some slough to the second toe.  PCP is Nabors, Chadwick C, DO.  Allergies  Allergen Reactions   Risperidone Other (See Comments)    parkinsonism   Risperidone And Related Other (See Comments)    parkinsonism   Hctz [Hydrochlorothiazide]     Objective:  There were no vitals filed for this visit.  Vascular Examination: Capillary refill time is less than 3 seconds to toes bilateral. Palpable pedal pulses b/l LE. Digital hair present b/l. No pedal edema b/l. Skin temperature gradient WNL b/l. No varicosities b/l. No cyanosis or clubbing noted b/l.   Dermatological Examination: Upon inspection of the PNA site, right first toe site healing well with stable scab and eschar formation, minimal erythema, no tenderness.  There is some redness and mild edema present to the second toe with some fibrous slough to the nailbed.  This was cleansed with rubbing alcohol.  Assessment/Plan: 1.  Cellulitis and abscess of toe of right foot     Meds ordered this encounter  Medications   mupirocin ointment (BACTROBAN) 2 %    Sig: Apply 1 Application topically 2 (two) times daily.    Dispense:  30 g    Refill:  2   Clinical findings discussed with patient and the patient's wife.  Right hallux total nail avulsion site healing well.  Concern for infection to the second toe.  Complete course of Augmentin.  Mupirocin applied to both toes today.  Recommend soaking the right second toe twice a day in warm Epsom salts, completing course of Augmentin, applying ointment as directed.  Monitor for worsening signs of infection.  Follow-up in 1 week for recheck.  Will obtain radiographs if there is concern for continued infection.  Return in about 1 week (around 02/23/2024) for Nail Check.   Santresa Levett L. Rebbecca Osuna, DPM, AACFAS [facility]     [address] [place], [place] [address]                Office [phone]  Fax [phone]

## 2024-02-16 NOTE — Patient Instructions (Signed)

## 2024-02-17 DIAGNOSIS — M25562 Pain in left knee: Secondary | ICD-10-CM | POA: Diagnosis not present

## 2024-02-17 DIAGNOSIS — R2689 Other abnormalities of gait and mobility: Secondary | ICD-10-CM | POA: Diagnosis not present

## 2024-02-18 DIAGNOSIS — Z09 Encounter for follow-up examination after completed treatment for conditions other than malignant neoplasm: Secondary | ICD-10-CM | POA: Diagnosis not present

## 2024-02-22 ENCOUNTER — Encounter: Payer: Self-pay | Admitting: Behavioral Health

## 2024-02-22 ENCOUNTER — Ambulatory Visit: Admitting: Behavioral Health

## 2024-02-22 DIAGNOSIS — G894 Chronic pain syndrome: Secondary | ICD-10-CM | POA: Diagnosis not present

## 2024-02-22 DIAGNOSIS — F331 Major depressive disorder, recurrent, moderate: Secondary | ICD-10-CM | POA: Diagnosis not present

## 2024-02-22 DIAGNOSIS — F411 Generalized anxiety disorder: Secondary | ICD-10-CM | POA: Diagnosis not present

## 2024-02-22 DIAGNOSIS — Z79891 Long term (current) use of opiate analgesic: Secondary | ICD-10-CM | POA: Diagnosis not present

## 2024-02-22 DIAGNOSIS — G609 Hereditary and idiopathic neuropathy, unspecified: Secondary | ICD-10-CM | POA: Diagnosis not present

## 2024-02-22 DIAGNOSIS — F99 Mental disorder, not otherwise specified: Secondary | ICD-10-CM

## 2024-02-22 DIAGNOSIS — F5105 Insomnia due to other mental disorder: Secondary | ICD-10-CM

## 2024-02-22 DIAGNOSIS — G629 Polyneuropathy, unspecified: Secondary | ICD-10-CM | POA: Diagnosis not present

## 2024-02-22 MED ORDER — ALPRAZOLAM 0.25 MG PO TABS: ORAL_TABLET | ORAL | 3 refills | Status: DC

## 2024-02-22 MED ORDER — TRAZODONE HCL 50 MG PO TABS: 50.0000 mg | ORAL_TABLET | Freq: Every evening | ORAL | 3 refills | Status: DC | PRN

## 2024-02-22 MED ORDER — SERTRALINE HCL 100 MG PO TABS: 250.0000 mg | ORAL_TABLET | Freq: Every day | ORAL | 3 refills | Status: AC

## 2024-02-22 NOTE — Progress Notes (Signed)
 [facility]  Patient ID: Joel Gross,  MRN: [id number]  PCP: Russell Court, DO  Date of Evaluation: 02/22/2024 Time spent:30 minutes  Chief Complaint:  Chief Complaint   Depression; Anxiety; Follow-up; Medication Refill; Patient Education     HISTORY/CURRENT STATUS: HPI  73 year old male presents to this office for follow up and medication management. Using rollator to assist with walking. Was doing better but had cellulitis in toes.  Is following up with ortho. Still bothered by foot pain. Feels like the increase dose of Zoloft  has helped his anxiety and depression. Says he has strong family support. He is now trying to walk outside several times per week.   Says his depression today is 2/10 and anxiety 2/10. Reports sleeping 7-8 hours per night.  Has not used Delta 8 recently.  No mania or psychosis. No SI/HI.   Past psychiatric medication failures: Risperidone  Flexeril  amiltriptyline  Xanax  Cymbalta  Abilify  Buspar  Wellbutrin  Vraylar -Reports caused foot drop       Individual Medical History/ Review of Systems: Changes? :No   Allergies: Risperidone , Risperidone  and related, and Hctz [hydrochlorothiazide ]  Current Medications:  Current Outpatient Medications:    acetaminophen  (TYLENOL ) 650 MG suppository, Place rectally., Disp: , Rfl:    ALPRAZolam  (XANAX ) 0.25 MG tablet, Take 1.5 tablets every day prn, Disp: 45 tablet, Rfl: 3   amLODipine  (NORVASC ) 10 MG tablet, Take 10 mg by mouth daily., Disp: , Rfl:    amLODipine  (NORVASC ) 10 MG tablet, Take by mouth., Disp: , Rfl:    amoxicillin-clavulanate (AUGMENTIN) 875-125 MG tablet, Take 1 tablet by mouth 2 (two) times daily., Disp: , Rfl:    atorvastatin (LIPITOR) 10 MG tablet, , Disp: , Rfl:    CARBONYL IRON PO, Take by mouth., Disp: , Rfl:    finasteride (PROSCAR) 5 MG tablet, Take 5 mg by mouth daily., Disp: , Rfl:    furosemide (LASIX) 20 MG tablet, Take 20 mg by mouth daily., Disp: , Rfl:     HYDROcodone -acetaminophen  (NORCO) 7.5-325 MG tablet, Take 1 tablet by mouth 3 (three) times daily., Disp: , Rfl:    lisinopril  (PRINIVIL ,ZESTRIL ) 40 MG tablet, Take 40 mg by mouth daily., Disp: , Rfl:    Multiple Vitamin (MULTI-VITAMIN) tablet, , Disp: , Rfl:    mupirocin  ointment (BACTROBAN ) 2 %, Apply 1 Application topically 2 (two) times daily., Disp: 30 g, Rfl: 2   ofloxacin (OCUFLOX) 0.3 % ophthalmic solution, , Disp: , Rfl:    oxyCODONE-acetaminophen  (PERCOCET) 7.5-325 MG tablet, Take 1 tablet by mouth 3 (three) times daily as needed., Disp: , Rfl:    Potassium Chloride  ER 20 MEQ TBCR, Take 5 tablets by mouth daily., Disp: , Rfl:    potassium chloride  SA (K-DUR,KLOR-CON ) 20 MEQ tablet, Take 20 mEq by mouth 2 (two) times daily., Disp: , Rfl:    pravastatin  (PRAVACHOL ) 20 MG tablet, Take 40 mg by mouth daily., Disp: , Rfl:    prednisoLONE acetate (PRED FORTE) 1 % ophthalmic suspension, Place into the left eye., Disp: , Rfl:    pregabalin  (LYRICA ) 200 MG capsule, Take 1 capsule (200 mg total) by mouth 3 (three) times daily., Disp: 90 capsule, Rfl: 0   rosuvastatin (CRESTOR) 5 MG tablet, Take 5 mg by mouth at bedtime as needed., Disp: , Rfl:    sertraline  (ZOLOFT ) 100 MG tablet, Take 2.5 tablets (250 mg total) by mouth daily., Disp: 75 tablet, Rfl: 3   silodosin (RAPAFLO) 8 MG CAPS capsule, Take 1 capsule by mouth at bedtime.,  Disp: , Rfl:    tamsulosin (FLOMAX) 0.4 MG CAPS capsule, Take 0.4 mg by mouth daily., Disp: , Rfl:    traZODone  (DESYREL ) 50 MG tablet, Take 1-2 tablets (50-100 mg total) by mouth at bedtime as needed., Disp: 60 tablet, Rfl: 3 Medication Side Effects: none  Family Medical/ Social History: Changes? No  MENTAL HEALTH EXAM:  There were no vitals taken for this visit.There is no height or weight on file to calculate BMI.  General Appearance: Casual, Neat, and Well Groomed  Eye Contact:  NA  Speech:  Clear and Coherent  Volume:  Normal  Mood:  NA  Affect:  Appropriate   Thought Process:  Coherent  Orientation:  Full (Time, Place, and Person)  Thought Content: Logical   Suicidal Thoughts:  No  Homicidal Thoughts:  No  Memory:  WNL  Judgement:  Good  Insight:  Good  Psychomotor Activity:  Normal  Concentration:  Concentration: Good  Recall:  Good  Fund of Knowledge: Good  Language: Good  Assets:  Desire for Improvement  ADL's:  Intact  Cognition: WNL  Prognosis:  Good    DIAGNOSES:    ICD-10-CM   1. Generalized anxiety disorder  F41.1 ALPRAZolam  (XANAX ) 0.25 MG tablet    sertraline  (ZOLOFT ) 100 MG tablet    2. Major depressive disorder, recurrent episode, moderate (HCC)  F33.1 sertraline  (ZOLOFT ) 100 MG tablet    3. Insomnia due to other mental disorder  F51.05 traZODone  (DESYREL ) 50 MG tablet   F99       Receiving Psychotherapy: No    RECOMMENDATIONS:   Greater than 50% of 30  min. face to face time with patient was spent on counseling and coordination of care.  Depression has improved. Discussed his recent hospital stay due to cellulitis. He is feeling better mentally but his frustration with frequent health issues keeps him discouraged. He has strong family support. Wife is assisting with care now.  He is requesting no medication changes today.  To continue Zoloft  to 250 mg daily.  Continue with Trazodone  50-100 mg at bedtime daily. To continue with xanax  0.25 mg twice daily as needed for severe anxiety Will report any side effects or worsening symptoms To follow up in 6 months to reassess. Provided emergency contact information . Discussed potential metabolic side effects associated with atypical antipsychotics, as well as potential risk for movement side effects. Advised pt to contact office if movement side effects occur.   Discussed potential benefits, risk, and side effects of benzodiazepines to include potential risk of tolerance and dependence, as well as possible drowsiness.  Advised patient not to drive if experiencing  drowsiness and to take lowest possible effective dose to minimize risk of dependence and tolerance.  Reviewed PDMP        Lincoln Renshaw, NP

## 2024-02-23 ENCOUNTER — Ambulatory Visit: Admitting: Podiatry

## 2024-02-23 DIAGNOSIS — L603 Nail dystrophy: Secondary | ICD-10-CM

## 2024-02-23 DIAGNOSIS — M25562 Pain in left knee: Secondary | ICD-10-CM | POA: Diagnosis not present

## 2024-02-23 DIAGNOSIS — L03031 Cellulitis of right toe: Secondary | ICD-10-CM

## 2024-02-23 DIAGNOSIS — L02611 Cutaneous abscess of right foot: Secondary | ICD-10-CM | POA: Diagnosis not present

## 2024-02-23 DIAGNOSIS — R2689 Other abnormalities of gait and mobility: Secondary | ICD-10-CM | POA: Diagnosis not present

## 2024-02-23 NOTE — Progress Notes (Unsigned)
       Subjective:  Patient ID: Joel Gross, male    DOB: 17-Oct-1951,  MRN: [id number]  Chief Complaint  Patient presents with   Toe Pain    Right 1st and 2nd toes. We removed the nails 2 weeks ago they look pretty good, he has been soaking accordingly. The 2nd toe is not as swollen as it was, nor as red.   Not diabetic and no anti coag.     Joel Gross presents to clinic today for f/u of PNA to the Right 1st and 2nd toes. Appearance improved with antibiotics and regular dressing changes, foot soaks.  PCP is Nabors, Lauralyn Pollack, DO.  Allergies  Allergen Reactions   Risperidone  Other (See Comments)    parkinsonism   Risperidone  And Related Other (See Comments)    parkinsonism   Hctz [Hydrochlorothiazide ]     Objective:  There were no vitals filed for this visit.  Vascular Examination: Capillary refill time is less than 3 seconds to toes bilateral. Palpable pedal pulses b/l LE. Digital hair present b/l. No pedal edema b/l. Skin temperature gradient WNL b/l. No varicosities b/l. No cyanosis or clubbing noted b/l.   Dermatological Examination: Upon inspection of the PNA site right 1st and 2nd toe, there are no clinical signs of infection.  No purulence, no necrosis, no malodor present.  Minimal to no erythema present.  Eschar formed along nail margin.  Minimal to no pain on palpation of area.   Assessment/Plan: 1. Nail dystrophy   2. Cellulitis and abscess of toe of right foot     No orders of the defined types were placed in this encounter.  PNA sites of right 1st and 2nd toe total nail avulsions healing well.  Signs of infection appear to be resolving. No need for extending course of PO antibiotics. May continue to do foot soaks and bandaging until dry scab starts to form.  Return in about 6 weeks (around 04/05/2024) for Routine Foot Care.   Magdalene Tardiff L. Lunda Salines, AACFAS [facility]     [address] [place], [place]  [address]                Office [phone]  Fax [phone]

## 2024-02-24 DIAGNOSIS — R3914 Feeling of incomplete bladder emptying: Secondary | ICD-10-CM | POA: Diagnosis not present

## 2024-02-26 DIAGNOSIS — R2689 Other abnormalities of gait and mobility: Secondary | ICD-10-CM | POA: Diagnosis not present

## 2024-02-26 DIAGNOSIS — M25562 Pain in left knee: Secondary | ICD-10-CM | POA: Diagnosis not present

## 2024-02-29 ENCOUNTER — Ambulatory Visit: Payer: Medicare HMO | Admitting: Behavioral Health

## 2024-03-01 DIAGNOSIS — R2689 Other abnormalities of gait and mobility: Secondary | ICD-10-CM | POA: Diagnosis not present

## 2024-03-01 DIAGNOSIS — M25562 Pain in left knee: Secondary | ICD-10-CM | POA: Diagnosis not present

## 2024-03-04 DIAGNOSIS — M25562 Pain in left knee: Secondary | ICD-10-CM | POA: Diagnosis not present

## 2024-03-04 DIAGNOSIS — R2689 Other abnormalities of gait and mobility: Secondary | ICD-10-CM | POA: Diagnosis not present

## 2024-03-08 DIAGNOSIS — M25562 Pain in left knee: Secondary | ICD-10-CM | POA: Diagnosis not present

## 2024-03-08 DIAGNOSIS — R2689 Other abnormalities of gait and mobility: Secondary | ICD-10-CM | POA: Diagnosis not present

## 2024-03-09 DIAGNOSIS — N401 Enlarged prostate with lower urinary tract symptoms: Secondary | ICD-10-CM | POA: Diagnosis not present

## 2024-03-09 DIAGNOSIS — R972 Elevated prostate specific antigen [PSA]: Secondary | ICD-10-CM | POA: Diagnosis not present

## 2024-03-09 DIAGNOSIS — N3946 Mixed incontinence: Secondary | ICD-10-CM | POA: Diagnosis not present

## 2024-03-09 DIAGNOSIS — R3914 Feeling of incomplete bladder emptying: Secondary | ICD-10-CM | POA: Diagnosis not present

## 2024-03-09 DIAGNOSIS — R3912 Poor urinary stream: Secondary | ICD-10-CM | POA: Diagnosis not present

## 2024-03-11 DIAGNOSIS — R2689 Other abnormalities of gait and mobility: Secondary | ICD-10-CM | POA: Diagnosis not present

## 2024-03-11 DIAGNOSIS — M25562 Pain in left knee: Secondary | ICD-10-CM | POA: Diagnosis not present

## 2024-03-15 ENCOUNTER — Other Ambulatory Visit: Payer: Self-pay | Admitting: Urology

## 2024-04-05 ENCOUNTER — Ambulatory Visit: Admitting: Podiatry

## 2024-04-05 DIAGNOSIS — N401 Enlarged prostate with lower urinary tract symptoms: Secondary | ICD-10-CM | POA: Diagnosis not present

## 2024-04-20 DIAGNOSIS — M47816 Spondylosis without myelopathy or radiculopathy, lumbar region: Secondary | ICD-10-CM | POA: Diagnosis not present

## 2024-05-02 DIAGNOSIS — R8271 Bacteriuria: Secondary | ICD-10-CM | POA: Diagnosis not present

## 2024-05-02 DIAGNOSIS — R8279 Other abnormal findings on microbiological examination of urine: Secondary | ICD-10-CM | POA: Diagnosis not present

## 2024-05-02 DIAGNOSIS — N401 Enlarged prostate with lower urinary tract symptoms: Secondary | ICD-10-CM | POA: Diagnosis not present

## 2024-05-02 DIAGNOSIS — R3914 Feeling of incomplete bladder emptying: Secondary | ICD-10-CM | POA: Diagnosis not present

## 2024-05-03 DIAGNOSIS — M170 Bilateral primary osteoarthritis of knee: Secondary | ICD-10-CM | POA: Diagnosis not present

## 2024-05-24 DIAGNOSIS — M47816 Spondylosis without myelopathy or radiculopathy, lumbar region: Secondary | ICD-10-CM | POA: Diagnosis not present

## 2024-05-25 ENCOUNTER — Encounter (HOSPITAL_COMMUNITY): Payer: Self-pay

## 2024-05-26 DIAGNOSIS — Z01818 Encounter for other preprocedural examination: Secondary | ICD-10-CM | POA: Diagnosis not present

## 2024-05-26 DIAGNOSIS — L989 Disorder of the skin and subcutaneous tissue, unspecified: Secondary | ICD-10-CM | POA: Diagnosis not present

## 2024-05-26 DIAGNOSIS — M1712 Unilateral primary osteoarthritis, left knee: Secondary | ICD-10-CM | POA: Diagnosis not present

## 2024-06-03 ENCOUNTER — Other Ambulatory Visit: Payer: Self-pay

## 2024-06-03 DIAGNOSIS — Z01810 Encounter for preprocedural cardiovascular examination: Secondary | ICD-10-CM | POA: Insufficient documentation

## 2024-06-07 DIAGNOSIS — M1712 Unilateral primary osteoarthritis, left knee: Secondary | ICD-10-CM | POA: Diagnosis not present

## 2024-06-14 ENCOUNTER — Ambulatory Visit: Attending: Cardiology | Admitting: Cardiology

## 2024-06-14 ENCOUNTER — Encounter: Payer: Self-pay | Admitting: Cardiology

## 2024-06-14 VITALS — BP 160/80 | HR 65 | Ht 73.0 in | Wt 205.8 lb

## 2024-06-14 DIAGNOSIS — I700 Atherosclerosis of aorta: Secondary | ICD-10-CM | POA: Diagnosis not present

## 2024-06-14 DIAGNOSIS — E785 Hyperlipidemia, unspecified: Secondary | ICD-10-CM | POA: Diagnosis not present

## 2024-06-14 DIAGNOSIS — M1712 Unilateral primary osteoarthritis, left knee: Secondary | ICD-10-CM | POA: Diagnosis not present

## 2024-06-14 DIAGNOSIS — I10 Essential (primary) hypertension: Secondary | ICD-10-CM

## 2024-06-14 DIAGNOSIS — Z01810 Encounter for preprocedural cardiovascular examination: Secondary | ICD-10-CM

## 2024-06-14 NOTE — Progress Notes (Signed)
 Cardiology Office Note:    Date:  06/14/2024   ID:  Joel Gross, DOB 21-Nov-1950, MRN [id number]  PCP:  Conley Dene BROCKS, DO  Cardiologist:  Jennifer JONELLE Crape, MD   Referring MD: Conley Dene BROCKS, DO    ASSESSMENT:    1. Hyperlipidemia, unspecified hyperlipidemia type   2. Aortic atherosclerosis (HCC)   3. Primary hypertension   4. Preop cardiovascular exam    PLAN:    In order of problems listed above:  Primary prevention stressed with the patient.  Importance of compliance with diet medication stressed and patient verbalized standing. Preop cardiovascular evaluation: I discussed my findings with the patient and extensive plan.  In view of multiple risk factors I discussed Lexiscan  sestamibi and he is agreeable.  If the test is negative is not at high risk for coronary events during the aforementioned surgery.  Medical hemodynamic monitoring will further reduce risk of coronary events. Essential hypertension: Blood pressure stable and diet was emphasized.  He mentions to me that he is under stress from the surgery and other issues.  Otherwise his blood pressure is fine at home.  He keeps a track of them. Mixed dyslipidemia: On lipid-lowering medications followed by primary care. Patient will be seen in follow-up appointment in 6 months or earlier if the patient has any concerns.    Medication Adjustments/Labs and Tests Ordered: Current medicines are reviewed at length with the patient today.  Concerns regarding medicines are outlined above.  Orders Placed This Encounter  Procedures   EKG 12-Lead   No orders of the defined types were placed in this encounter.    History of Present Illness:    Joel Gross is a 73 y.o. male who is being seen today for the evaluation of preop cardiovascular evaluation at the request of Conley Dene BROCKS, DO.  Patient is a pleasant 73 year old male.  He has past medical history of essential hypertension, mixed dyslipidemia and he is  planning to undergo knee replacement surgery.  He denies any chest pain orthopnea or PND.  At the time of my evaluation, the patient is alert awake oriented and in no distress.  He relates assessment to lifestyle because of orthopedic issues.  Is here for preop assessment.  Past Medical History:  Diagnosis Date   BPH with obstruction/lower urinary tract symptoms 06/19/2017   Chronic pain disorder 06/19/2017   GAD (generalized anxiety disorder) 06/23/2017   History of elevated PSA 11/03/2019   HTN (hypertension) 06/23/2017   Hyperlipidemia 06/23/2017   Idiopathic progressive polyneuropathy 11/25/2013   MDD (major depressive disorder), recurrent, severe, with psychosis 06/23/2017   Visual hallucinations   Mild cognitive impairment 03/08/2023   Obesity 09/02/2011   Preop cardiovascular exam    Suicide attempt by drug overdose    Oxycodone; ~ winter 2023   Unilateral primary osteoarthritis, right knee 03/06/2023    Past Surgical History:  Procedure Laterality Date   APPENDECTOMY     HERNIA REPAIR     KNEE ARTHROSCOPY Left    SHOULDER SURGERY Right     Current Medications: Current Meds  Medication Sig   ALPRAZolam  (XANAX ) 0.25 MG tablet Take 1.5 tablets every day prn   amLODipine  (NORVASC ) 10 MG tablet Take 10 mg by mouth daily.   celecoxib (CELEBREX) 200 MG capsule Take 200 mg by mouth 2 (two) times daily as needed for mild pain (pain score 1-3) or moderate pain (pain score 4-6).   finasteride (PROSCAR) 5 MG tablet Take 5 mg by mouth  daily.   lisinopril  (PRINIVIL ,ZESTRIL ) 40 MG tablet Take 40 mg by mouth daily.   Multiple Vitamin (MULTI-VITAMIN) tablet Take 1 tablet by mouth daily.   mupirocin  ointment (BACTROBAN ) 2 % Apply 1 Application topically 2 (two) times daily.   ofloxacin (OCUFLOX) 0.3 % ophthalmic solution    oxyCODONE-acetaminophen  (PERCOCET) 7.5-325 MG tablet Take 1 tablet by mouth 3 (three) times daily as needed.   potassium chloride  SA (K-DUR,KLOR-CON ) 20 MEQ tablet  Take 100 mEq by mouth daily.   pregabalin  (LYRICA ) 200 MG capsule Take 1 capsule (200 mg total) by mouth 3 (three) times daily.   sertraline  (ZOLOFT ) 100 MG tablet Take 2.5 tablets (250 mg total) by mouth daily.   tamsulosin (FLOMAX) 0.4 MG CAPS capsule Take 0.4 mg by mouth daily.   traZODone  (DESYREL ) 50 MG tablet Take 1-2 tablets (50-100 mg total) by mouth at bedtime as needed.   [DISCONTINUED] atorvastatin (LIPITOR) 10 MG tablet Take 10 mg by mouth daily.     Allergies:   Risperidone , Risperidone  and paliperidone, Hctz [hydrochlorothiazide ], Sertraline , and Venlafaxine   Social History   Socioeconomic History   Marital status: Married    Spouse name: Not on file   Number of children: 3   Years of education: 14   Highest education level: Associate degree: occupational, Scientist, product/process development, or vocational program  Occupational History   Occupation: Retired    Comment: Chartered certified accountant; Radiographer, therapeutic  Tobacco Use   Smoking status: Former    Types: Cigarettes   Smokeless tobacco: Never  Vaping Use   Vaping status: Never Used  Substance and Sexual Activity   Alcohol use: No   Drug use: No   Sexual activity: Yes    Birth control/protection: None  Other Topics Concern   Not on file  Social History Narrative   Lives with wife in a 2 story home.  Has 3 grown children.  Retired Chartered certified accountant.  Education: Publishing copy.  Wanting to find [place] based family therapist. Experiencing late adulthood relationship disparity with spouse. Spouse not happy with quality of life. He continues to work multiple projects around the home without completion. Spouse wanting him to slow down and clean the projects up and spend quality time with family.    Right handed   No caffeine   Two story home   Social Drivers of Corporate investment banker Strain: Not on file  Food Insecurity: Not on file  Transportation Needs: Not on file  Physical Activity: Not on file  Stress: Not on file  Social Connections: Not on file      Family History: The patient's family history includes Anxiety disorder in his mother; CAD in his father and mother; Depression in his mother; Rheumatic fever in his brother.  ROS:   Please see the history of present illness.    All other systems reviewed and are negative.  EKGs/Labs/Other Studies Reviewed:    The following studies were reviewed today: EKG reveals sinus rhythm and nonspecific ST-T changes   Recent Labs: No results found for requested labs within last 365 days.  Recent Lipid Panel    Component Value Date/Time   CHOL 140 06/20/2017 0756   TRIG 106 06/20/2017 0756   HDL 29 (L) 06/20/2017 0756   CHOLHDL 4.8 06/20/2017 0756   VLDL 21 06/20/2017 0756   LDLCALC 90 06/20/2017 0756    Physical Exam:    VS:  BP (!) 160/80   Pulse 65   Ht 6' 1 (1.854 m)   Wt 205 lb  12.8 oz (93.4 kg)   SpO2 95%   BMI 27.15 kg/m     Wt Readings from Last 3 Encounters:  06/14/24 205 lb 12.8 oz (93.4 kg)  03/06/23 207 lb (93.9 kg)  09/19/22 227 lb (103 kg)     GEN: Patient is in no acute distress HEENT: Normal NECK: No JVD; No carotid bruits LYMPHATICS: No lymphadenopathy CARDIAC: S1 S2 regular, 2/6 systolic murmur at the apex. RESPIRATORY:  Clear to auscultation without rales, wheezing or rhonchi  ABDOMEN: Soft, non-tender, non-distended MUSCULOSKELETAL:  No edema; No deformity  SKIN: Warm and dry NEUROLOGIC:  Alert and oriented x 3 PSYCHIATRIC:  Normal affect    Signed, Jennifer JONELLE Crape, MD  06/14/2024 10:31 AM    [facility]

## 2024-06-14 NOTE — Addendum Note (Signed)
 Addended by: SHERRE ADE I on: 06/14/2024 10:55 AM   Modules accepted: Orders

## 2024-06-14 NOTE — Patient Instructions (Signed)
 Medication Instructions:  Your physician recommends that you continue on your current medications as directed. Please refer to the Current Medication list given to you today.  *If you need a refill on your cardiac medications before your next appointment, please call your pharmacy*  Lab Work: None If you have labs (blood work) drawn today and your tests are completely normal, you will receive your results only by: MyChart Message (if you have MyChart) OR A paper copy in the mail If you have any lab test that is abnormal or we need to change your treatment, we will call you to review the results.  Testing/Procedures:   [facility] [address] [place], [place] [address] Phone:  [phone]    Please arrive 15 minutes prior to your appointment time for registration and insurance purposes.  The test will take approximately 3 to 4 hours to complete; you may bring reading material.  If someone comes with you to your appointment, they will need to remain in the main lobby due to limited space in the testing area. **If you are pregnant or breastfeeding, please notify the nuclear lab prior to your appointment**  How to prepare for your Myocardial Perfusion Test: Do not eat or drink 3 hours prior to your test, except you may have water. Do not consume products containing caffeine (regular or decaffeinated) 12 hours prior to your test. (ex: coffee, chocolate, sodas, tea). Do bring a list of your current medications with you.  If not listed below, you may take your medications as normal. Do wear comfortable clothes (no dresses or overalls) and walking shoes, tennis shoes preferred (No heels or open toe shoes are allowed). Do NOT wear cologne, perfume, aftershave, or lotions (deodorant is allowed). If these instructions are not followed, your test will have to be rescheduled.  Please report to [address] for your test.  If you have questions or concerns  about your appointment, you can call the [facility] at [phone].  If you cannot keep your appointment, please provide 24 hours notification to the Nuclear Lab, to avoid a possible $50 charge to your account.   Follow-Up: At [facility], you and your health needs are our priority.  As part of our continuing mission to provide you with exceptional heart care, our providers are all part of one team.  This team includes your primary Cardiologist (physician) and Advanced Practice Providers or APPs (Physician Assistants and Nurse Practitioners) who all work together to provide you with the care you need, when you need it.  Your next appointment:   Follow up as needed  Provider:   Jennifer Crape, MD    We recommend signing up for the patient portal called MyChart.  Sign up information is provided on this After Visit Summary.  MyChart is used to connect with patients for Virtual Visits (Telemedicine).  Patients are able to view lab/test results, encounter notes, upcoming appointments, etc.  Non-urgent messages can be sent to your provider as well.   To learn more about what you can do with MyChart, go to [email or web address].   Other Instructions None

## 2024-06-15 ENCOUNTER — Ambulatory Visit: Attending: Cardiology

## 2024-06-15 DIAGNOSIS — E785 Hyperlipidemia, unspecified: Secondary | ICD-10-CM | POA: Diagnosis not present

## 2024-06-15 DIAGNOSIS — Z01810 Encounter for preprocedural cardiovascular examination: Secondary | ICD-10-CM | POA: Diagnosis not present

## 2024-06-15 DIAGNOSIS — I700 Atherosclerosis of aorta: Secondary | ICD-10-CM | POA: Diagnosis not present

## 2024-06-15 DIAGNOSIS — I10 Essential (primary) hypertension: Secondary | ICD-10-CM | POA: Diagnosis not present

## 2024-06-15 MED ORDER — TECHNETIUM TC 99M TETROFOSMIN IV KIT
30.5000 | PACK | Freq: Once | INTRAVENOUS | Status: AC | PRN
  Administered 2024-06-15 (×2): 30.5 via INTRAVENOUS

## 2024-06-15 MED ORDER — REGADENOSON 0.4 MG/5ML IV SOLN
0.4000 mg | Freq: Once | INTRAVENOUS | Status: AC
  Administered 2024-06-15 (×2): 0.4 mg via INTRAVENOUS

## 2024-06-15 MED ORDER — TECHNETIUM TC 99M TETROFOSMIN IV KIT
10.8000 | PACK | Freq: Once | INTRAVENOUS | Status: AC | PRN
  Administered 2024-06-15 (×2): 10.8 via INTRAVENOUS

## 2024-06-16 ENCOUNTER — Ambulatory Visit: Payer: Self-pay | Admitting: Cardiology

## 2024-06-16 DIAGNOSIS — M1712 Unilateral primary osteoarthritis, left knee: Secondary | ICD-10-CM | POA: Diagnosis not present

## 2024-06-16 LAB — MYOCARDIAL PERFUSION IMAGING
LV dias vol: 174 mL (ref 62–150)
LV sys vol: 101 mL (ref 4.2–5.8)
Nuc Stress EF: 42 %
Peak HR: 66 {beats}/min
Rest HR: 51 {beats}/min
Rest Nuclear Isotope Dose: 10.8 mCi
SDS: 0
SRS: 4
SSS: 4
Stress Nuclear Isotope Dose: 30.5 mCi
TID: 1.13

## 2024-06-17 ENCOUNTER — Telehealth: Payer: Self-pay | Admitting: Cardiology

## 2024-06-17 NOTE — Telephone Encounter (Signed)
 Wife returning call

## 2024-06-17 NOTE — Telephone Encounter (Signed)
 Called the patient and informed him of the results of his stress test below:  No evidence of ischemia fixed defect suggest scar. Copy primary will discuss more at appointment at next visit'  Patient verbalized understanding and had no further questions at this time.

## 2024-06-17 NOTE — Telephone Encounter (Signed)
Left message for the patients wife to call back. 

## 2024-06-17 NOTE — Telephone Encounter (Signed)
 Patients wife called on behalf of husband and is asking for a call back in regards to his stress test. CB # [phone]

## 2024-06-20 NOTE — Telephone Encounter (Signed)
 Stress test results routed to Dr. Conley and Dr. Lewanda per pt request.

## 2024-06-20 NOTE — Telephone Encounter (Signed)
 Patient's wife would like to know if results can be sent to Dr. Vinie Fonder of [facility] (phone#: [phone]) and Dr. Dene Livings, PCP [phone]) as soon as possible. Wife would like a call back to confirm.

## 2024-06-21 ENCOUNTER — Ambulatory Visit (HOSPITAL_COMMUNITY): Admit: 2024-06-21 | Admitting: Urology

## 2024-06-21 SURGERY — TURP (TRANSURETHRAL RESECTION OF PROSTATE)
Anesthesia: General

## 2024-06-22 DIAGNOSIS — M1712 Unilateral primary osteoarthritis, left knee: Secondary | ICD-10-CM | POA: Diagnosis not present

## 2024-06-25 DIAGNOSIS — J180 Bronchopneumonia, unspecified organism: Secondary | ICD-10-CM | POA: Diagnosis not present

## 2024-06-25 DIAGNOSIS — I5022 Chronic systolic (congestive) heart failure: Secondary | ICD-10-CM | POA: Diagnosis not present

## 2024-06-25 DIAGNOSIS — N182 Chronic kidney disease, stage 2 (mild): Secondary | ICD-10-CM | POA: Diagnosis not present

## 2024-06-25 DIAGNOSIS — J189 Pneumonia, unspecified organism: Secondary | ICD-10-CM | POA: Diagnosis not present

## 2024-06-25 DIAGNOSIS — M1712 Unilateral primary osteoarthritis, left knee: Secondary | ICD-10-CM | POA: Diagnosis not present

## 2024-06-25 DIAGNOSIS — I2609 Other pulmonary embolism with acute cor pulmonale: Secondary | ICD-10-CM | POA: Diagnosis not present

## 2024-06-25 DIAGNOSIS — J9601 Acute respiratory failure with hypoxia: Secondary | ICD-10-CM | POA: Diagnosis not present

## 2024-06-25 DIAGNOSIS — I7123 Aneurysm of the descending thoracic aorta, without rupture: Secondary | ICD-10-CM | POA: Diagnosis not present

## 2024-06-25 DIAGNOSIS — Z87891 Personal history of nicotine dependence: Secondary | ICD-10-CM | POA: Diagnosis not present

## 2024-06-25 DIAGNOSIS — N179 Acute kidney failure, unspecified: Secondary | ICD-10-CM | POA: Diagnosis not present

## 2024-06-25 DIAGNOSIS — J159 Unspecified bacterial pneumonia: Secondary | ICD-10-CM | POA: Diagnosis not present

## 2024-06-25 DIAGNOSIS — I2119 ST elevation (STEMI) myocardial infarction involving other coronary artery of inferior wall: Secondary | ICD-10-CM | POA: Diagnosis not present

## 2024-06-25 DIAGNOSIS — I10 Essential (primary) hypertension: Secondary | ICD-10-CM | POA: Diagnosis not present

## 2024-06-25 DIAGNOSIS — I129 Hypertensive chronic kidney disease with stage 1 through stage 4 chronic kidney disease, or unspecified chronic kidney disease: Secondary | ICD-10-CM | POA: Diagnosis not present

## 2024-06-25 DIAGNOSIS — I700 Atherosclerosis of aorta: Secondary | ICD-10-CM | POA: Diagnosis not present

## 2024-06-25 DIAGNOSIS — R0989 Other specified symptoms and signs involving the circulatory and respiratory systems: Secondary | ICD-10-CM | POA: Diagnosis not present

## 2024-06-25 DIAGNOSIS — N400 Enlarged prostate without lower urinary tract symptoms: Secondary | ICD-10-CM | POA: Diagnosis not present

## 2024-06-25 DIAGNOSIS — M810 Age-related osteoporosis without current pathological fracture: Secondary | ICD-10-CM | POA: Diagnosis not present

## 2024-06-25 DIAGNOSIS — Z96652 Presence of left artificial knee joint: Secondary | ICD-10-CM | POA: Diagnosis not present

## 2024-06-25 DIAGNOSIS — I2699 Other pulmonary embolism without acute cor pulmonale: Secondary | ICD-10-CM | POA: Diagnosis not present

## 2024-06-25 DIAGNOSIS — R2681 Unsteadiness on feet: Secondary | ICD-10-CM | POA: Diagnosis not present

## 2024-06-25 DIAGNOSIS — G629 Polyneuropathy, unspecified: Secondary | ICD-10-CM | POA: Diagnosis not present

## 2024-06-25 DIAGNOSIS — Z79899 Other long term (current) drug therapy: Secondary | ICD-10-CM | POA: Diagnosis not present

## 2024-06-25 DIAGNOSIS — R531 Weakness: Secondary | ICD-10-CM | POA: Diagnosis not present

## 2024-06-25 DIAGNOSIS — M545 Low back pain, unspecified: Secondary | ICD-10-CM | POA: Diagnosis not present

## 2024-06-25 DIAGNOSIS — I77810 Thoracic aortic ectasia: Secondary | ICD-10-CM | POA: Diagnosis not present

## 2024-06-25 DIAGNOSIS — R918 Other nonspecific abnormal finding of lung field: Secondary | ICD-10-CM | POA: Diagnosis not present

## 2024-06-25 DIAGNOSIS — E876 Hypokalemia: Secondary | ICD-10-CM | POA: Diagnosis not present

## 2024-06-25 DIAGNOSIS — F329 Major depressive disorder, single episode, unspecified: Secondary | ICD-10-CM | POA: Diagnosis not present

## 2024-06-25 DIAGNOSIS — R410 Disorientation, unspecified: Secondary | ICD-10-CM | POA: Diagnosis not present

## 2024-06-25 DIAGNOSIS — R2689 Other abnormalities of gait and mobility: Secondary | ICD-10-CM | POA: Diagnosis not present

## 2024-06-25 DIAGNOSIS — D62 Acute posthemorrhagic anemia: Secondary | ICD-10-CM | POA: Diagnosis not present

## 2024-06-25 DIAGNOSIS — Z888 Allergy status to other drugs, medicaments and biological substances status: Secondary | ICD-10-CM | POA: Diagnosis not present

## 2024-06-25 DIAGNOSIS — I2510 Atherosclerotic heart disease of native coronary artery without angina pectoris: Secondary | ICD-10-CM | POA: Diagnosis not present

## 2024-06-26 DIAGNOSIS — J9601 Acute respiratory failure with hypoxia: Secondary | ICD-10-CM | POA: Diagnosis not present

## 2024-06-27 DIAGNOSIS — J9601 Acute respiratory failure with hypoxia: Secondary | ICD-10-CM | POA: Diagnosis not present

## 2024-06-28 DIAGNOSIS — E876 Hypokalemia: Secondary | ICD-10-CM | POA: Diagnosis not present

## 2024-06-28 DIAGNOSIS — G8918 Other acute postprocedural pain: Secondary | ICD-10-CM | POA: Diagnosis not present

## 2024-06-28 DIAGNOSIS — I2699 Other pulmonary embolism without acute cor pulmonale: Secondary | ICD-10-CM | POA: Diagnosis not present

## 2024-06-28 DIAGNOSIS — M545 Low back pain, unspecified: Secondary | ICD-10-CM | POA: Diagnosis not present

## 2024-06-28 DIAGNOSIS — M810 Age-related osteoporosis without current pathological fracture: Secondary | ICD-10-CM | POA: Diagnosis not present

## 2024-06-28 DIAGNOSIS — I5022 Chronic systolic (congestive) heart failure: Secondary | ICD-10-CM | POA: Diagnosis not present

## 2024-06-28 DIAGNOSIS — Z96652 Presence of left artificial knee joint: Secondary | ICD-10-CM | POA: Diagnosis not present

## 2024-06-28 DIAGNOSIS — Z471 Aftercare following joint replacement surgery: Secondary | ICD-10-CM | POA: Diagnosis not present

## 2024-06-28 DIAGNOSIS — R2689 Other abnormalities of gait and mobility: Secondary | ICD-10-CM | POA: Diagnosis not present

## 2024-06-28 DIAGNOSIS — G629 Polyneuropathy, unspecified: Secondary | ICD-10-CM | POA: Diagnosis not present

## 2024-06-28 DIAGNOSIS — J9601 Acute respiratory failure with hypoxia: Secondary | ICD-10-CM | POA: Diagnosis not present

## 2024-06-28 DIAGNOSIS — I2609 Other pulmonary embolism with acute cor pulmonale: Secondary | ICD-10-CM | POA: Diagnosis not present

## 2024-06-28 DIAGNOSIS — I10 Essential (primary) hypertension: Secondary | ICD-10-CM | POA: Diagnosis not present

## 2024-06-28 DIAGNOSIS — D62 Acute posthemorrhagic anemia: Secondary | ICD-10-CM | POA: Diagnosis not present

## 2024-06-28 DIAGNOSIS — R2681 Unsteadiness on feet: Secondary | ICD-10-CM | POA: Diagnosis not present

## 2024-06-28 DIAGNOSIS — J189 Pneumonia, unspecified organism: Secondary | ICD-10-CM | POA: Diagnosis not present

## 2024-06-28 DIAGNOSIS — J159 Unspecified bacterial pneumonia: Secondary | ICD-10-CM | POA: Diagnosis not present

## 2024-06-29 DIAGNOSIS — Z96652 Presence of left artificial knee joint: Secondary | ICD-10-CM | POA: Diagnosis not present

## 2024-06-29 DIAGNOSIS — G8918 Other acute postprocedural pain: Secondary | ICD-10-CM | POA: Diagnosis not present

## 2024-06-29 DIAGNOSIS — J189 Pneumonia, unspecified organism: Secondary | ICD-10-CM | POA: Diagnosis not present

## 2024-06-29 DIAGNOSIS — I2699 Other pulmonary embolism without acute cor pulmonale: Secondary | ICD-10-CM | POA: Diagnosis not present

## 2024-07-06 DIAGNOSIS — Z96652 Presence of left artificial knee joint: Secondary | ICD-10-CM | POA: Diagnosis not present

## 2024-07-06 DIAGNOSIS — Z471 Aftercare following joint replacement surgery: Secondary | ICD-10-CM | POA: Diagnosis not present

## 2024-07-14 DIAGNOSIS — Z6827 Body mass index (BMI) 27.0-27.9, adult: Secondary | ICD-10-CM | POA: Diagnosis not present

## 2024-07-14 DIAGNOSIS — I2699 Other pulmonary embolism without acute cor pulmonale: Secondary | ICD-10-CM | POA: Diagnosis not present

## 2024-07-14 DIAGNOSIS — F331 Major depressive disorder, recurrent, moderate: Secondary | ICD-10-CM | POA: Diagnosis not present

## 2024-07-25 ENCOUNTER — Telehealth: Payer: Self-pay | Admitting: Pharmacist

## 2024-07-25 ENCOUNTER — Other Ambulatory Visit (HOSPITAL_COMMUNITY): Payer: Self-pay

## 2024-07-25 ENCOUNTER — Telehealth: Payer: Self-pay

## 2024-07-25 NOTE — Telephone Encounter (Signed)
  The patient was approved for a [employer] grant that will help cover the cost of Eliquis Total amount awarded, $10,000.  Effective: 06/25/2024 - 06/24/2025   APW:[id number] ERW:[id number] Hmnle:[id number] [id number]   Pharmacy provided with approval and processing information. Patient informed via Telephone  Inocente Butcher CPhT  Pharmacy Patient Advocate Specialist  Direct Number: [phone] Fax: [phone]

## 2024-07-25 NOTE — Progress Notes (Signed)
   07/25/2024  Patient ID: Joel Gross, male   DOB: January 10, 1951, 73 y.o.   MRN: [id number]  Telephonic engagement with Mr. Nicolis Boody today regarding referral for Eliquis assistance. Patient reports that he is currently taking aspirin daily as his co-pay for the Eliquis was $400. Informed patient that he has been approved for the [employer] grant and his co-pay for the Eliquis will now be $0 and that he is able to pick up the Eliquis today.  Counseled on dosing. Patient verbalizes understanding.  Annabella Galla, PharmD Clinical Pharmacist [place] Direct Dial: [phone]

## 2024-07-25 NOTE — Telephone Encounter (Signed)
 Pharmacy Patient Advocate Encounter  Insurance verification completed.   The patient is insured through [employer] test claim for Eliquis. Currently a quantity of 60 is a 30 day supply and the co-pay is $152.71 .   This test claim was processed through [facility]- copay amounts may vary at other pharmacies due to pharmacy/plan contracts, or as the patient moves through the different stages of their insurance plan.

## 2024-07-27 DIAGNOSIS — I10 Essential (primary) hypertension: Secondary | ICD-10-CM | POA: Diagnosis not present

## 2024-07-27 DIAGNOSIS — R4182 Altered mental status, unspecified: Secondary | ICD-10-CM | POA: Diagnosis not present

## 2024-07-27 DIAGNOSIS — E876 Hypokalemia: Secondary | ICD-10-CM | POA: Diagnosis not present

## 2024-07-27 DIAGNOSIS — R079 Chest pain, unspecified: Secondary | ICD-10-CM | POA: Diagnosis not present

## 2024-07-30 DIAGNOSIS — G9340 Encephalopathy, unspecified: Secondary | ICD-10-CM | POA: Diagnosis not present

## 2024-07-30 DIAGNOSIS — R310 Gross hematuria: Secondary | ICD-10-CM | POA: Diagnosis not present

## 2024-07-30 DIAGNOSIS — R2689 Other abnormalities of gait and mobility: Secondary | ICD-10-CM | POA: Diagnosis not present

## 2024-07-30 DIAGNOSIS — K5900 Constipation, unspecified: Secondary | ICD-10-CM | POA: Diagnosis not present

## 2024-07-30 DIAGNOSIS — F03B4 Unspecified dementia, moderate, with anxiety: Secondary | ICD-10-CM | POA: Diagnosis not present

## 2024-07-30 DIAGNOSIS — G629 Polyneuropathy, unspecified: Secondary | ICD-10-CM | POA: Diagnosis not present

## 2024-07-30 DIAGNOSIS — R4182 Altered mental status, unspecified: Secondary | ICD-10-CM | POA: Diagnosis not present

## 2024-07-30 DIAGNOSIS — F03B3 Unspecified dementia, moderate, with mood disturbance: Secondary | ICD-10-CM | POA: Diagnosis not present

## 2024-07-30 DIAGNOSIS — F0284 Dementia in other diseases classified elsewhere, unspecified severity, with anxiety: Secondary | ICD-10-CM | POA: Diagnosis not present

## 2024-07-30 DIAGNOSIS — M545 Low back pain, unspecified: Secondary | ICD-10-CM | POA: Diagnosis not present

## 2024-07-30 DIAGNOSIS — Z87891 Personal history of nicotine dependence: Secondary | ICD-10-CM | POA: Diagnosis not present

## 2024-07-30 DIAGNOSIS — R319 Hematuria, unspecified: Secondary | ICD-10-CM | POA: Diagnosis not present

## 2024-07-30 DIAGNOSIS — G8929 Other chronic pain: Secondary | ICD-10-CM | POA: Diagnosis not present

## 2024-07-30 DIAGNOSIS — M6281 Muscle weakness (generalized): Secondary | ICD-10-CM | POA: Diagnosis not present

## 2024-07-30 DIAGNOSIS — N183 Chronic kidney disease, stage 3 unspecified: Secondary | ICD-10-CM | POA: Diagnosis not present

## 2024-07-30 DIAGNOSIS — N179 Acute kidney failure, unspecified: Secondary | ICD-10-CM | POA: Diagnosis not present

## 2024-07-30 DIAGNOSIS — G894 Chronic pain syndrome: Secondary | ICD-10-CM | POA: Diagnosis not present

## 2024-07-30 DIAGNOSIS — I6783 Posterior reversible encephalopathy syndrome: Secondary | ICD-10-CM | POA: Diagnosis not present

## 2024-07-30 DIAGNOSIS — E871 Hypo-osmolality and hyponatremia: Secondary | ICD-10-CM | POA: Diagnosis not present

## 2024-07-30 DIAGNOSIS — N401 Enlarged prostate with lower urinary tract symptoms: Secondary | ICD-10-CM | POA: Diagnosis not present

## 2024-07-30 DIAGNOSIS — I110 Hypertensive heart disease with heart failure: Secondary | ICD-10-CM | POA: Diagnosis not present

## 2024-07-30 DIAGNOSIS — F329 Major depressive disorder, single episode, unspecified: Secondary | ICD-10-CM | POA: Diagnosis not present

## 2024-07-30 DIAGNOSIS — I10 Essential (primary) hypertension: Secondary | ICD-10-CM | POA: Diagnosis not present

## 2024-07-30 DIAGNOSIS — Z743 Need for continuous supervision: Secondary | ICD-10-CM | POA: Diagnosis not present

## 2024-07-30 DIAGNOSIS — F0153 Vascular dementia, unspecified severity, with mood disturbance: Secondary | ICD-10-CM | POA: Diagnosis not present

## 2024-07-30 DIAGNOSIS — G4700 Insomnia, unspecified: Secondary | ICD-10-CM | POA: Diagnosis not present

## 2024-07-30 DIAGNOSIS — E878 Other disorders of electrolyte and fluid balance, not elsewhere classified: Secondary | ICD-10-CM | POA: Diagnosis not present

## 2024-07-30 DIAGNOSIS — I160 Hypertensive urgency: Secondary | ICD-10-CM | POA: Diagnosis not present

## 2024-07-30 DIAGNOSIS — I674 Hypertensive encephalopathy: Secondary | ICD-10-CM | POA: Diagnosis not present

## 2024-07-30 DIAGNOSIS — R531 Weakness: Secondary | ICD-10-CM | POA: Diagnosis not present

## 2024-07-30 DIAGNOSIS — Z791 Long term (current) use of non-steroidal anti-inflammatories (NSAID): Secondary | ICD-10-CM | POA: Diagnosis not present

## 2024-07-30 DIAGNOSIS — E860 Dehydration: Secondary | ICD-10-CM | POA: Diagnosis not present

## 2024-07-30 DIAGNOSIS — R279 Unspecified lack of coordination: Secondary | ICD-10-CM | POA: Diagnosis not present

## 2024-07-30 DIAGNOSIS — Z87440 Personal history of urinary (tract) infections: Secondary | ICD-10-CM | POA: Diagnosis not present

## 2024-07-30 DIAGNOSIS — Z7401 Bed confinement status: Secondary | ICD-10-CM | POA: Diagnosis not present

## 2024-07-30 DIAGNOSIS — R457 State of emotional shock and stress, unspecified: Secondary | ICD-10-CM | POA: Diagnosis not present

## 2024-07-30 DIAGNOSIS — I951 Orthostatic hypotension: Secondary | ICD-10-CM | POA: Diagnosis not present

## 2024-07-30 DIAGNOSIS — R5381 Other malaise: Secondary | ICD-10-CM | POA: Diagnosis not present

## 2024-07-30 DIAGNOSIS — N400 Enlarged prostate without lower urinary tract symptoms: Secondary | ICD-10-CM | POA: Diagnosis not present

## 2024-07-30 DIAGNOSIS — F411 Generalized anxiety disorder: Secondary | ICD-10-CM | POA: Diagnosis not present

## 2024-07-30 DIAGNOSIS — M1990 Unspecified osteoarthritis, unspecified site: Secondary | ICD-10-CM | POA: Diagnosis not present

## 2024-07-30 DIAGNOSIS — E876 Hypokalemia: Secondary | ICD-10-CM | POA: Diagnosis not present

## 2024-07-30 DIAGNOSIS — R404 Transient alteration of awareness: Secondary | ICD-10-CM | POA: Diagnosis not present

## 2024-07-30 DIAGNOSIS — Z91040 Latex allergy status: Secondary | ICD-10-CM | POA: Diagnosis not present

## 2024-07-30 DIAGNOSIS — I5032 Chronic diastolic (congestive) heart failure: Secondary | ICD-10-CM | POA: Diagnosis not present

## 2024-07-30 DIAGNOSIS — K219 Gastro-esophageal reflux disease without esophagitis: Secondary | ICD-10-CM | POA: Diagnosis not present

## 2024-07-31 DIAGNOSIS — G9340 Encephalopathy, unspecified: Secondary | ICD-10-CM | POA: Diagnosis not present

## 2024-07-31 DIAGNOSIS — I160 Hypertensive urgency: Secondary | ICD-10-CM | POA: Diagnosis not present

## 2024-07-31 DIAGNOSIS — R531 Weakness: Secondary | ICD-10-CM | POA: Diagnosis not present

## 2024-08-01 DIAGNOSIS — G9340 Encephalopathy, unspecified: Secondary | ICD-10-CM | POA: Diagnosis not present

## 2024-08-01 DIAGNOSIS — R531 Weakness: Secondary | ICD-10-CM | POA: Diagnosis not present

## 2024-08-01 DIAGNOSIS — I160 Hypertensive urgency: Secondary | ICD-10-CM | POA: Diagnosis not present

## 2024-08-02 DIAGNOSIS — G9340 Encephalopathy, unspecified: Secondary | ICD-10-CM | POA: Diagnosis not present

## 2024-08-02 DIAGNOSIS — R531 Weakness: Secondary | ICD-10-CM | POA: Diagnosis not present

## 2024-08-03 DIAGNOSIS — G9340 Encephalopathy, unspecified: Secondary | ICD-10-CM | POA: Diagnosis not present

## 2024-08-03 DIAGNOSIS — I160 Hypertensive urgency: Secondary | ICD-10-CM | POA: Diagnosis not present

## 2024-08-03 DIAGNOSIS — R531 Weakness: Secondary | ICD-10-CM | POA: Diagnosis not present

## 2024-08-04 DIAGNOSIS — I160 Hypertensive urgency: Secondary | ICD-10-CM | POA: Diagnosis not present

## 2024-08-04 DIAGNOSIS — G9340 Encephalopathy, unspecified: Secondary | ICD-10-CM | POA: Diagnosis not present

## 2024-08-04 DIAGNOSIS — R531 Weakness: Secondary | ICD-10-CM | POA: Diagnosis not present

## 2024-08-05 DIAGNOSIS — R531 Weakness: Secondary | ICD-10-CM | POA: Diagnosis not present

## 2024-08-05 DIAGNOSIS — G9340 Encephalopathy, unspecified: Secondary | ICD-10-CM | POA: Diagnosis not present

## 2024-08-06 DIAGNOSIS — R531 Weakness: Secondary | ICD-10-CM | POA: Diagnosis not present

## 2024-08-06 DIAGNOSIS — I160 Hypertensive urgency: Secondary | ICD-10-CM | POA: Diagnosis not present

## 2024-08-06 DIAGNOSIS — G9340 Encephalopathy, unspecified: Secondary | ICD-10-CM | POA: Diagnosis not present

## 2024-08-07 DIAGNOSIS — R531 Weakness: Secondary | ICD-10-CM | POA: Diagnosis not present

## 2024-08-07 DIAGNOSIS — G9340 Encephalopathy, unspecified: Secondary | ICD-10-CM | POA: Diagnosis not present

## 2024-08-07 DIAGNOSIS — I160 Hypertensive urgency: Secondary | ICD-10-CM | POA: Diagnosis not present

## 2024-08-08 ENCOUNTER — Ambulatory Visit: Payer: Self-pay | Admitting: Cardiology

## 2024-08-08 DIAGNOSIS — G9340 Encephalopathy, unspecified: Secondary | ICD-10-CM | POA: Diagnosis not present

## 2024-08-08 DIAGNOSIS — R531 Weakness: Secondary | ICD-10-CM | POA: Diagnosis not present

## 2024-08-08 DIAGNOSIS — I160 Hypertensive urgency: Secondary | ICD-10-CM | POA: Diagnosis not present

## 2024-08-09 DIAGNOSIS — R531 Weakness: Secondary | ICD-10-CM | POA: Diagnosis not present

## 2024-08-09 DIAGNOSIS — I160 Hypertensive urgency: Secondary | ICD-10-CM | POA: Diagnosis not present

## 2024-08-09 DIAGNOSIS — G9340 Encephalopathy, unspecified: Secondary | ICD-10-CM | POA: Diagnosis not present

## 2024-08-10 DIAGNOSIS — I119 Hypertensive heart disease without heart failure: Secondary | ICD-10-CM | POA: Diagnosis not present

## 2024-08-10 DIAGNOSIS — Z7401 Bed confinement status: Secondary | ICD-10-CM | POA: Diagnosis not present

## 2024-08-10 DIAGNOSIS — G9340 Encephalopathy, unspecified: Secondary | ICD-10-CM | POA: Diagnosis not present

## 2024-08-10 DIAGNOSIS — I10 Essential (primary) hypertension: Secondary | ICD-10-CM | POA: Diagnosis not present

## 2024-08-10 DIAGNOSIS — F329 Major depressive disorder, single episode, unspecified: Secondary | ICD-10-CM | POA: Diagnosis not present

## 2024-08-10 DIAGNOSIS — M545 Low back pain, unspecified: Secondary | ICD-10-CM | POA: Diagnosis not present

## 2024-08-10 DIAGNOSIS — M6281 Muscle weakness (generalized): Secondary | ICD-10-CM | POA: Diagnosis not present

## 2024-08-10 DIAGNOSIS — R531 Weakness: Secondary | ICD-10-CM | POA: Diagnosis not present

## 2024-08-10 DIAGNOSIS — N183 Chronic kidney disease, stage 3 unspecified: Secondary | ICD-10-CM | POA: Diagnosis not present

## 2024-08-10 DIAGNOSIS — R4182 Altered mental status, unspecified: Secondary | ICD-10-CM | POA: Diagnosis not present

## 2024-08-10 DIAGNOSIS — R2689 Other abnormalities of gait and mobility: Secondary | ICD-10-CM | POA: Diagnosis not present

## 2024-08-10 DIAGNOSIS — R279 Unspecified lack of coordination: Secondary | ICD-10-CM | POA: Diagnosis not present

## 2024-08-10 DIAGNOSIS — G894 Chronic pain syndrome: Secondary | ICD-10-CM | POA: Diagnosis not present

## 2024-08-10 DIAGNOSIS — N1831 Chronic kidney disease, stage 3a: Secondary | ICD-10-CM | POA: Diagnosis not present

## 2024-08-10 DIAGNOSIS — Z743 Need for continuous supervision: Secondary | ICD-10-CM | POA: Diagnosis not present

## 2024-08-10 DIAGNOSIS — K5900 Constipation, unspecified: Secondary | ICD-10-CM | POA: Diagnosis not present

## 2024-08-10 DIAGNOSIS — G4700 Insomnia, unspecified: Secondary | ICD-10-CM | POA: Diagnosis not present

## 2024-08-10 DIAGNOSIS — R262 Difficulty in walking, not elsewhere classified: Secondary | ICD-10-CM | POA: Diagnosis not present

## 2024-08-10 DIAGNOSIS — I6783 Posterior reversible encephalopathy syndrome: Secondary | ICD-10-CM | POA: Diagnosis not present

## 2024-08-10 DIAGNOSIS — G629 Polyneuropathy, unspecified: Secondary | ICD-10-CM | POA: Diagnosis not present

## 2024-08-10 DIAGNOSIS — I160 Hypertensive urgency: Secondary | ICD-10-CM | POA: Diagnosis not present

## 2024-08-10 DIAGNOSIS — N401 Enlarged prostate with lower urinary tract symptoms: Secondary | ICD-10-CM | POA: Diagnosis not present

## 2024-08-10 DIAGNOSIS — F411 Generalized anxiety disorder: Secondary | ICD-10-CM | POA: Diagnosis not present

## 2024-08-10 DIAGNOSIS — F0284 Dementia in other diseases classified elsewhere, unspecified severity, with anxiety: Secondary | ICD-10-CM | POA: Diagnosis not present

## 2024-08-14 DIAGNOSIS — R262 Difficulty in walking, not elsewhere classified: Secondary | ICD-10-CM | POA: Diagnosis not present

## 2024-08-14 DIAGNOSIS — N1831 Chronic kidney disease, stage 3a: Secondary | ICD-10-CM | POA: Diagnosis not present

## 2024-08-14 DIAGNOSIS — I119 Hypertensive heart disease without heart failure: Secondary | ICD-10-CM | POA: Diagnosis not present

## 2024-08-14 DIAGNOSIS — G9340 Encephalopathy, unspecified: Secondary | ICD-10-CM | POA: Diagnosis not present

## 2024-08-23 ENCOUNTER — Ambulatory Visit: Admitting: Behavioral Health

## 2024-09-02 DIAGNOSIS — G629 Polyneuropathy, unspecified: Secondary | ICD-10-CM | POA: Diagnosis not present

## 2024-09-02 DIAGNOSIS — M47816 Spondylosis without myelopathy or radiculopathy, lumbar region: Secondary | ICD-10-CM | POA: Diagnosis not present

## 2024-09-12 DIAGNOSIS — Z96652 Presence of left artificial knee joint: Secondary | ICD-10-CM | POA: Diagnosis not present

## 2024-09-12 DIAGNOSIS — Z471 Aftercare following joint replacement surgery: Secondary | ICD-10-CM | POA: Diagnosis not present

## 2024-09-14 DIAGNOSIS — Z961 Presence of intraocular lens: Secondary | ICD-10-CM | POA: Diagnosis not present

## 2024-09-14 DIAGNOSIS — H35372 Puckering of macula, left eye: Secondary | ICD-10-CM | POA: Diagnosis not present

## 2024-09-14 DIAGNOSIS — H52223 Regular astigmatism, bilateral: Secondary | ICD-10-CM | POA: Diagnosis not present

## 2024-10-08 ENCOUNTER — Other Ambulatory Visit: Payer: Self-pay | Admitting: Behavioral Health

## 2024-10-08 DIAGNOSIS — F411 Generalized anxiety disorder: Secondary | ICD-10-CM

## 2024-10-10 NOTE — Telephone Encounter (Signed)
 Please call to schedule FU, was due in October but provider canceled.

## 2024-10-11 NOTE — Telephone Encounter (Signed)
 Pt is scheduled for 12/17

## 2024-10-19 ENCOUNTER — Encounter: Payer: Self-pay | Admitting: Behavioral Health

## 2024-10-19 ENCOUNTER — Ambulatory Visit: Admitting: Behavioral Health

## 2024-10-19 DIAGNOSIS — F411 Generalized anxiety disorder: Secondary | ICD-10-CM | POA: Diagnosis not present

## 2024-10-19 MED ORDER — ALPRAZOLAM 0.25 MG PO TABS: ORAL_TABLET | ORAL | 3 refills | Status: AC

## 2024-10-19 NOTE — Progress Notes (Signed)
 [facility]  Patient ID: Joel Gross,  MRN: [id number]  PCP: Conley Dene BROCKS., MD  Date of Evaluation: 10/19/2024 Time spent:30 minutes  Chief Complaint:  Chief Complaint   Depression; Anxiety; Follow-up; Medication Refill; Patient Education; Stress     HISTORY/CURRENT STATUS: HPI 73 year old male presents to this office for follow up and medication management.  Using 4 point cane to ambulate.  Had left knee replacement  in August 2025. Feels like the increase dose of Zoloft  has helped his anxiety and depression. Says he has strong family support. Using low dose xanax  appropriately. He is now trying to walk outside several times per week.   Says his depression today is 2/10 and anxiety 2/10. Reports sleeping 7-8 hours per night.  Has not used Delta 8 recently.  No mania or psychosis. No SI/HI.   Past psychiatric medication failures: Risperidone  Flexeril  amiltriptyline  Xanax  Cymbalta  Abilify  Buspar  Wellbutrin  Vraylar -Reports caused foot drop  Individual Medical History/ Review of Systems: Changes? :No   Allergies: Risperidone , Risperidone  and paliperidone, Hctz [hydrochlorothiazide ], Sertraline , and Venlafaxine  Current Medications: Current Medications[1] Medication Side Effects: none  Family Medical/ Social History: Changes? No  MENTAL HEALTH EXAM:  There were no vitals taken for this visit.There is no height or weight on file to calculate BMI.  General Appearance: Casual, Neat, and Well Groomed  Eye Contact:  Good  Speech:  Clear and Coherent  Volume:  Normal  Mood:  NA  Affect:  Appropriate  Thought Process:  Coherent  Orientation:  Full (Time, Place, and Person)  Thought Content: Logical   Suicidal Thoughts:  No  Homicidal Thoughts:  No  Memory:  WNL  Judgement:  Good  Insight:  Good  Psychomotor Activity:  Normal  Concentration:  Concentration: Good  Recall:  Good  Fund of Knowledge: Good  Language: Good  Assets:  Desire for  Improvement  ADL's:  Intact  Cognition: WNL  Prognosis:  Good    DIAGNOSES:    ICD-10-CM   1. Generalized anxiety disorder  F41.1 ALPRAZolam  (XANAX ) 0.25 MG tablet      Receiving Psychotherapy: No    RECOMMENDATIONS:   Greater than 50% of 30  min. face to face time with patient was spent on counseling and coordination of care.  We discussed his good stability since last visit approximately 6 months ago.  He has experienced some health issues such as uncontrolled BP and also had left knee replacement.  He is ambulating much better today using a 4 point cane.  Has difficulty in making turns in the hallway.  Wife is assisting with care now.  He is requesting no medication changes today.  To continue Zoloft  to 250 mg daily.  Continue with Trazodone  50-100 mg at bedtime daily. To continue with xanax  0.25 mg twice daily as needed for severe anxiety Will report any side effects or worsening symptoms To follow up in 6 months to reassess. Provided emergency contact information . Discussed potential metabolic side effects associated with atypical antipsychotics, as well as potential risk for movement side effects. Advised pt to contact office if movement side effects occur.   Discussed potential benefits, risk, and side effects of benzodiazepines to include potential risk of tolerance and dependence, as well as possible drowsiness.  Advised patient not to drive if experiencing drowsiness and to take lowest possible effective dose to minimize risk of dependence and tolerance.  Reviewed PDMP          Redell DELENA Pizza, NP     [  1]  Current Outpatient Medications:    ALPRAZolam  (XANAX ) 0.25 MG tablet, Take 1-2 tablets by mouth daily as needed for severe anxiety, Disp: 60 tablet, Rfl: 3   amLODipine  (NORVASC ) 10 MG tablet, Take 10 mg by mouth daily., Disp: , Rfl:    celecoxib (CELEBREX) 200 MG capsule, Take 200 mg by mouth 2 (two) times daily as needed for mild pain (pain score 1-3) or moderate  pain (pain score 4-6)., Disp: , Rfl:    finasteride (PROSCAR) 5 MG tablet, Take 5 mg by mouth daily., Disp: , Rfl:    lisinopril  (PRINIVIL ,ZESTRIL ) 40 MG tablet, Take 40 mg by mouth daily., Disp: , Rfl:    Multiple Vitamin (MULTI-VITAMIN) tablet, Take 1 tablet by mouth daily., Disp: , Rfl:    mupirocin  ointment (BACTROBAN ) 2 %, Apply 1 Application topically 2 (two) times daily., Disp: 30 g, Rfl: 2   ofloxacin (OCUFLOX) 0.3 % ophthalmic solution, , Disp: , Rfl:    potassium chloride  SA (K-DUR,KLOR-CON ) 20 MEQ tablet, Take 100 mEq by mouth daily., Disp: , Rfl:    pregabalin  (LYRICA ) 200 MG capsule, Take 1 capsule (200 mg total) by mouth 3 (three) times daily., Disp: 90 capsule, Rfl: 0   sertraline  (ZOLOFT ) 100 MG tablet, Take 2.5 tablets (250 mg total) by mouth daily., Disp: 75 tablet, Rfl: 3   tamsulosin (FLOMAX) 0.4 MG CAPS capsule, Take 0.4 mg by mouth daily., Disp: , Rfl:    traZODone  (DESYREL ) 50 MG tablet, Take 1-2 tablets (50-100 mg total) by mouth at bedtime as needed., Disp: 60 tablet, Rfl: 3

## 2024-10-28 ENCOUNTER — Other Ambulatory Visit: Payer: Self-pay | Admitting: Behavioral Health

## 2024-10-28 DIAGNOSIS — F5105 Insomnia due to other mental disorder: Secondary | ICD-10-CM

## 2024-12-05 ENCOUNTER — Telehealth: Payer: Self-pay

## 2024-12-05 NOTE — Telephone Encounter (Signed)
 PA submitted via CMM Sertraline  100 mg # (due to high dosing) Received a fax from [facility] requesting further questions answered. Will complete and fax back once office has reopened.

## 2024-12-09 NOTE — Telephone Encounter (Signed)
 PA approved #[id number] Sertraline  100 mg 2.5 tabs daily  Effective through 11/02/25 [facility] [employer]

## 2024-12-09 NOTE — Telephone Encounter (Signed)
 Alicia at [facility] called at 3:05p stating they had questions about the qty for the Sertaline.  Call back to [phone] Case [id number]  Next appt 6/17

## 2025-04-19 ENCOUNTER — Ambulatory Visit: Admitting: Behavioral Health
